# Patient Record
Sex: Male | Born: 1950 | Race: White | Hispanic: No | Marital: Single | Smoking: Former smoker
Health system: Southern US, Community
[De-identification: ages and names within clinical notes are randomized; demographics above are authoritative.]

## PROBLEM LIST (undated history)

## (undated) DIAGNOSIS — F419 Anxiety disorder, unspecified: Secondary | ICD-10-CM

## (undated) DIAGNOSIS — F32A Depression, unspecified: Secondary | ICD-10-CM

## (undated) DIAGNOSIS — E785 Hyperlipidemia, unspecified: Secondary | ICD-10-CM

## (undated) DIAGNOSIS — N400 Enlarged prostate without lower urinary tract symptoms: Secondary | ICD-10-CM

## (undated) DIAGNOSIS — M779 Enthesopathy, unspecified: Secondary | ICD-10-CM

## (undated) DIAGNOSIS — J45909 Unspecified asthma, uncomplicated: Secondary | ICD-10-CM

## (undated) DIAGNOSIS — C801 Malignant (primary) neoplasm, unspecified: Secondary | ICD-10-CM

## (undated) DIAGNOSIS — H9319 Tinnitus, unspecified ear: Secondary | ICD-10-CM

## (undated) DIAGNOSIS — G4700 Insomnia, unspecified: Secondary | ICD-10-CM

## (undated) DIAGNOSIS — F329 Major depressive disorder, single episode, unspecified: Secondary | ICD-10-CM

## (undated) DIAGNOSIS — I10 Essential (primary) hypertension: Secondary | ICD-10-CM

## (undated) HISTORY — DX: Benign prostatic hyperplasia without lower urinary tract symptoms: N40.0

## (undated) HISTORY — DX: Hyperlipidemia, unspecified: E78.5

## (undated) HISTORY — DX: Insomnia, unspecified: G47.00

---
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
## 1997-10-25 ENCOUNTER — Encounter: Admission: RE | Admit: 1997-10-25 | Discharge: 1997-10-25 | Payer: Self-pay | Admitting: Family Medicine

## 1997-11-26 ENCOUNTER — Encounter: Admission: RE | Admit: 1997-11-26 | Discharge: 1997-11-26 | Payer: Self-pay | Admitting: Family Medicine

## 1998-06-13 ENCOUNTER — Encounter: Admission: RE | Admit: 1998-06-13 | Discharge: 1998-06-13 | Payer: Self-pay | Admitting: Family Medicine

## 1998-07-19 ENCOUNTER — Encounter: Admission: RE | Admit: 1998-07-19 | Discharge: 1998-07-19 | Payer: Self-pay | Admitting: Family Medicine

## 1998-08-14 ENCOUNTER — Encounter: Admission: RE | Admit: 1998-08-14 | Discharge: 1998-08-14 | Payer: Self-pay | Admitting: Family Medicine

## 1998-08-28 ENCOUNTER — Encounter: Admission: RE | Admit: 1998-08-28 | Discharge: 1998-08-28 | Payer: Self-pay | Admitting: Family Medicine

## 1998-11-11 ENCOUNTER — Encounter: Admission: RE | Admit: 1998-11-11 | Discharge: 1998-11-11 | Payer: Self-pay | Admitting: Family Medicine

## 1998-12-10 ENCOUNTER — Encounter: Admission: RE | Admit: 1998-12-10 | Discharge: 1998-12-10 | Payer: Self-pay | Admitting: Sports Medicine

## 1998-12-27 ENCOUNTER — Encounter: Admission: RE | Admit: 1998-12-27 | Discharge: 1998-12-27 | Payer: Self-pay | Admitting: Family Medicine

## 1999-07-04 ENCOUNTER — Inpatient Hospital Stay (HOSPITAL_COMMUNITY): Admission: EM | Admit: 1999-07-04 | Discharge: 1999-07-04 | Payer: Self-pay | Admitting: Emergency Medicine

## 1999-07-04 ENCOUNTER — Encounter: Payer: Self-pay | Admitting: Emergency Medicine

## 1999-07-08 ENCOUNTER — Encounter: Admission: RE | Admit: 1999-07-08 | Discharge: 1999-07-08 | Payer: Self-pay | Admitting: Family Medicine

## 1999-07-09 ENCOUNTER — Ambulatory Visit (HOSPITAL_COMMUNITY): Admission: RE | Admit: 1999-07-09 | Discharge: 1999-07-09 | Payer: Self-pay | Admitting: Family Medicine

## 2006-08-13 ENCOUNTER — Ambulatory Visit: Payer: Self-pay | Admitting: Family Medicine

## 2006-09-02 ENCOUNTER — Ambulatory Visit: Payer: Self-pay | Admitting: Family Medicine

## 2007-01-03 ENCOUNTER — Ambulatory Visit: Payer: Self-pay | Admitting: Family Medicine

## 2007-04-15 ENCOUNTER — Ambulatory Visit: Payer: Self-pay | Admitting: Family Medicine

## 2008-03-02 ENCOUNTER — Ambulatory Visit: Payer: Self-pay | Admitting: Family Medicine

## 2010-10-17 NOTE — Discharge Summary (Signed)
[place]  Patient:    Nathan Hunt, Nathan Hunt                      MRN: [id number] Dictator:   Lyndee Leo. Janey Greaser, M.D.                           Discharge Summary  HISTORY OF PRESENT ILLNESS:  [name] is a 60 year old white male who was admitted on July 03, 1999, with a chief complaint of feeling his heart beating fast and then stopping and starting.  Patient was found to be in atrial fibrillation in the emergency room.  However, before any intervention was done,  patient converted back to normal sinus rhythm.  HOSPITAL COURSE:  Patient was admitted to the telemetry bed for monitoring. Patient was in normal sinus rhythm for the rest of hospital stay with no symptoms whatsoever, no chest pain, no dyspnea.  Patient had an echocardiogram done on the following morning which is still pending.  PERTINENT LABORATORY WORK:  CBC:  White blood cell count was 7, hemoglobin 15.7, hematocrit 43.7, platelets 221.  BMET was within normal limits.  He ruled out by cardiac enzymes.  Thyroid studies:  Free T4 was 1.27, TSH was 5.2.  He was negative on urine drug screen.  UA was negative.  DISCHARGE DIAGNOSIS:  New onset atrial fibrillation with reconversion to normal  sinus rhythm without any intervention.  PLAN:  Patient is to follow up with Dr. Lance Bosch from the [facility] who is his primary on February 6 at 2:50.  Patient will continue on his home medications.  FOLLOW-UP:  Patient will need an outpatient stress test as well as just monitoring for any return to atrial fibrillation.  DISCHARGE MEDICATIONS:  Home medicines: 1. Claritin 10 mg p.o. q.d. 2. Naprosyn 500 mg p.o. p.r.n. 3. Pepcid 10 mg p.o. b.i.d. 4. Zyrtec 10 mg p.o. q.d. DD:  07/05/99 TD:  07/05/99 Job: [id number]

## 2010-10-17 NOTE — H&P (Signed)
[place]  Patient:    Nathan Hunt                     MRN: [id number] Adm. Date:  [id number] Attending:  Doneta Public Dictator:   Heath Gold, M.D.                         History and Physical  DATE OF BIRTH:  May 30, 1951.  SERVICE:  Family Medicine.  CHIEF COMPLAINT:  Rapid heart rate.  HISTORY OF PRESENT ILLNESS:  This is a 60 year old white male who reports feeling his heart start beating fast and then stopping and starting; he noticed this first at 11:10 p.m.  This feeling in his chest was also associated with a pressure in his throat and some mild pressure in his chest but no chest pain and no numbness in his arms.  He reports that he has felt this one to two times before in his adult life but it has only lasted a few seconds.  He denies any recent illness.  No cardiac disease.  No thyroid disease.  He is a large coffee drinker.  PAST MEDICAL HISTORY:  Only significant for chronic low back pain that he has had for six months and occasional allergic rhinitis.  No hypertension.  No diabetes. No heart disease.  MEDICATIONS: 1. Naprosyn 500 mg p.o. q.d. p.r.n. 2. Pepcid 10 mg p.o. b.i.d. or t.i.d. 3. Digestive enzymes q.d.  ALLERGIES:  None.  SOCIAL HISTORY:  He has a 24-pack-year history of tobacco abuse but quit in 1992. An occasional social drinker, less than one beer per week.  No drug use.  No exercise x 6 months.  He does drink 20 to 30 ounces of coffee per day.  He is single, lives alone in [place], [place] and has no children.  FAMILY HISTORY:  Mom is alive at 26 with hypertension.  Dad is alive at 1 with an MI at 31 years old.  Maternal grandmother died of an MI at 40.  Maternal grandfather died at an unknown age of bone cancer.  He has two siblings that are healthy.  PAST SURGICAL HISTORY:  Tonsillectomy as a child.  REVIEW OF SYSTEMS:  He denies any recent fevers or chills.  No  weight loss.  No  chest pain.  No shortness of breath.  No cough.  No nausea or diarrhea.  No abdominal pain.  No change in bowel movements.  No bright red blood per rectum. No melena.  No skin rash.  No skin changes.  No change in appetite.  No vision changes.  No gait abnormalities.  No joint pain.  No dysuria.  No difficulty urinating.  PHYSICAL EXAMINATION:  VITAL SIGNS:  Temperature is 96.9, pulse 88 and regular, blood pressure 105/78,  respirations 20.  O2 99% on room air.  GENERAL:  He is very well-appearing, well-kept, alert and oriented x 4, in no distress.  HEENT:  [place]/AT.  PERRLA.  EOMI.  Nares patent.  OP without erythema or exudate.  Good dentition.  TMs clear.  NECK:  Supple.  No lymphadenopathy.  No JVD.  No thyromegaly.  CV:  RRR, without murmur, rub or gallop.  Normal PMI.  LUNGS:  CTAB.  ABDOMEN:  Soft, nontender, nondistended.  Positive BS.  No HSM.  SKIN:  Warm and dry.  No rash.  NEUROLOGIC:  Cranial nerves II-XII grossly intact.  Strength 5/5 throughout.  Sensation intact and equal throughout.  Rapid alternating movements intact bilaterally.  DTRs 1 to 2+ throughout.  Babinskis downgoing.  RECTAL:  Normal sphincter tone.  Prostate smooth.  No nodules.  No stool in vault. Heme-negative.  LABORATORY AND X-RAY FINDINGS:  Sodium 139, potassium 3.6, chloride 108, bicarb 26, BUN 29, creatinine 0.8, glucose 124, calcium 9.1.  White blood cell count 7.0, hemoglobin 15.7, hematocrit 43.7, platelets 221,000.  PT 12.8, INR 1.0, PTT 33,  troponin K 0.04, CK 100, MB 1.5 with an index of 1.5.  Chest x-ray shows no active disease.  EKG initially showed atrial fibrillation at 151 beats per minute.  Repeat EKG shows normal sinus rhythm at 91 beats per minute.  ASSESSMENT AND PLAN:  This is a 60 year old white male with new-onset atrial fibrillation and self-conversion without requiring any medication.  Unclear etiology of his atrial fibrillation.  He has no  obvious intrinsic cardiac disease, no history of thyroid disease and no recent viral illness.  We will admit to telemetry, rule him out for an myocardial infarction, check thyroid-stimulating  hormone and thyroid studies, obtain an 2-D echocardiogram in the a.m. to evaluate left ventricular function, left atrial size and valve and consider anticoagulation if he converts back into atrial fibrillation.  Will encourage decreased caffeine use. DD:  07/04/99 TD:  07/04/99 Job: [id number]

## 2010-11-25 ENCOUNTER — Encounter: Payer: Self-pay | Admitting: Family Medicine

## 2011-03-10 DIAGNOSIS — Z0289 Encounter for other administrative examinations: Secondary | ICD-10-CM

## 2011-03-23 ENCOUNTER — Telehealth: Payer: Self-pay | Admitting: Family Medicine

## 2011-03-23 NOTE — Telephone Encounter (Signed)
Nathan Hunt, what kind of elbow brace would you suggest this patient to buy?  CLS

## 2011-03-23 NOTE — Telephone Encounter (Signed)
HAVING ARM/ELBOW PROBLEM THAT HE HAS FROM TIME TO TIME. COMES FROM OVERUSE WHILE DOING SAWING MOTION. CROOK OF ARM HURT AND STIFF. BEEN DOING HOT BATHS AND USING IBUPROFEN LIKE NORMAL WHICH HAS HELPED. DOES NURSE THINK FOREARM BANDAGE WOULD HELP? THINK HE USED ONE BEFORE BUT WHAT KIND SHLD HE GET IF THEY THINK IT WILL HELP?

## 2011-03-23 NOTE — Telephone Encounter (Signed)
i recommend OV so I can eval.

## 2011-03-24 NOTE — Telephone Encounter (Signed)
Patient states that he is unable to afford an OV. He is trying to save his house. He said that he has an old sling and that he thinks that it will be all right. CLS

## 2012-09-01 ENCOUNTER — Encounter (HOSPITAL_COMMUNITY): Payer: Self-pay | Admitting: *Deleted

## 2012-09-01 ENCOUNTER — Emergency Department (HOSPITAL_COMMUNITY)
Admission: EM | Admit: 2012-09-01 | Discharge: 2012-09-02 | Disposition: A | Discharge status: Home / Self Care | Payer: BC Managed Care – PPO | Attending: Emergency Medicine | Admitting: Emergency Medicine

## 2012-09-01 ENCOUNTER — Emergency Department (HOSPITAL_COMMUNITY): Payer: BC Managed Care – PPO

## 2012-09-01 DIAGNOSIS — R443 Hallucinations, unspecified: Secondary | ICD-10-CM

## 2012-09-01 DIAGNOSIS — F3289 Other specified depressive episodes: Secondary | ICD-10-CM | POA: Insufficient documentation

## 2012-09-01 DIAGNOSIS — G4700 Insomnia, unspecified: Secondary | ICD-10-CM | POA: Insufficient documentation

## 2012-09-01 DIAGNOSIS — F29 Unspecified psychosis not due to a substance or known physiological condition: Secondary | ICD-10-CM

## 2012-09-01 DIAGNOSIS — F411 Generalized anxiety disorder: Secondary | ICD-10-CM | POA: Insufficient documentation

## 2012-09-01 DIAGNOSIS — Z8669 Personal history of other diseases of the nervous system and sense organs: Secondary | ICD-10-CM | POA: Insufficient documentation

## 2012-09-01 DIAGNOSIS — Z79899 Other long term (current) drug therapy: Secondary | ICD-10-CM | POA: Insufficient documentation

## 2012-09-01 DIAGNOSIS — F339 Major depressive disorder, recurrent, unspecified: Secondary | ICD-10-CM

## 2012-09-01 DIAGNOSIS — I10 Essential (primary) hypertension: Secondary | ICD-10-CM | POA: Insufficient documentation

## 2012-09-01 DIAGNOSIS — Z8739 Personal history of other diseases of the musculoskeletal system and connective tissue: Secondary | ICD-10-CM | POA: Insufficient documentation

## 2012-09-01 DIAGNOSIS — F329 Major depressive disorder, single episode, unspecified: Secondary | ICD-10-CM | POA: Insufficient documentation

## 2012-09-01 HISTORY — DX: Enthesopathy, unspecified: M77.9

## 2012-09-01 HISTORY — DX: Major depressive disorder, single episode, unspecified: F32.9

## 2012-09-01 HISTORY — DX: Essential (primary) hypertension: I10

## 2012-09-01 HISTORY — DX: Depression, unspecified: F32.A

## 2012-09-01 LAB — COMPREHENSIVE METABOLIC PANEL
AST: 30 U/L (ref 0–37)
Alkaline Phosphatase: 71 U/L (ref 39–117)
CO2: 24 mEq/L (ref 19–32)
Chloride: 102 mEq/L (ref 96–112)
Creatinine, Ser: 1.07 mg/dL (ref 0.50–1.35)
GFR calc non Af Amer: 72 mL/min — ABNORMAL LOW (ref >90)
Potassium: 3.9 mEq/L (ref 3.5–5.1)
Total Bilirubin: 0.4 mg/dL (ref 0.3–1.2)

## 2012-09-01 LAB — CBC WITH DIFFERENTIAL/PLATELET
Basophils Absolute: 0 10*3/uL (ref 0.0–0.1)
HCT: 39.4 % (ref 39.0–52.0)
Hemoglobin: 13.4 g/dL (ref 13.0–17.0)
Lymphocytes Relative: 22 % (ref 12–46)
Monocytes Absolute: 0.6 10*3/uL (ref 0.1–1.0)
Monocytes Relative: 10 % (ref 3–12)
Neutro Abs: 4.1 10*3/uL (ref 1.7–7.7)
Neutrophils Relative %: 67 % (ref 43–77)
RDW: 13.3 % (ref 11.5–15.5)
WBC: 6.1 10*3/uL (ref 4.0–10.5)

## 2012-09-01 LAB — SALICYLATE LEVEL: Salicylate Lvl: <2 mg/dL — ABNORMAL LOW (ref 2.8–20.0)

## 2012-09-01 LAB — ETHANOL: Alcohol, Ethyl (B): <11 mg/dL (ref 0–11)

## 2012-09-01 LAB — ACETAMINOPHEN LEVEL: Acetaminophen (Tylenol), Serum: <15 ug/mL (ref 10–30)

## 2012-09-01 IMAGING — CT CT HEAD W/O CM
2 series · 16 of 30 positions shown, 20 images · non-contrast
Comparison: None.

CLINICAL DATA: Bizarre behavior.

CT HEAD WITHOUT CONTRAST
TECHNIQUE: Contiguous axial images were obtained from the base of
the skull through the vertex without contrast.

[Series 2: head w/o · axial · non-contrast · 0.48mm/px · z∈[+987,+1117]mm · 13 of 32 slices shown, 17 images]
[im 3/32  brain]
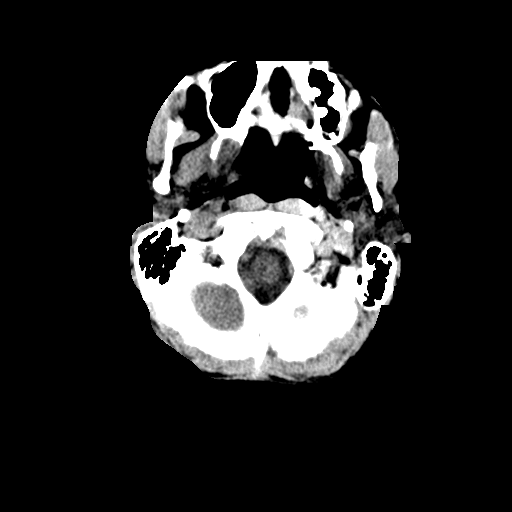
[im 3/32  bone]
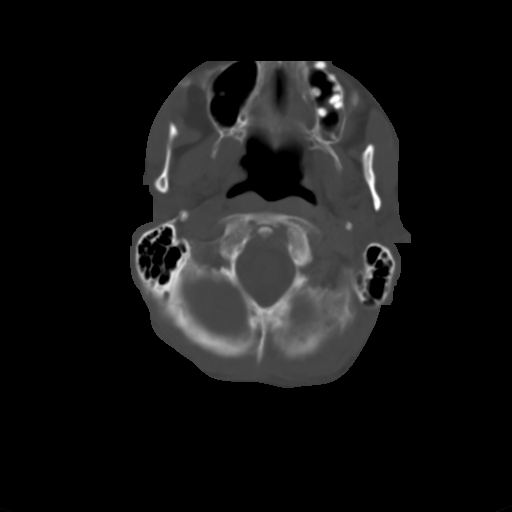
[im 5/32  brain]
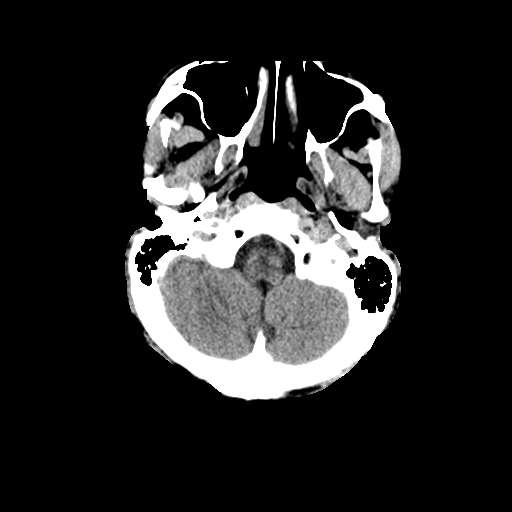
[im 7/32  brain]
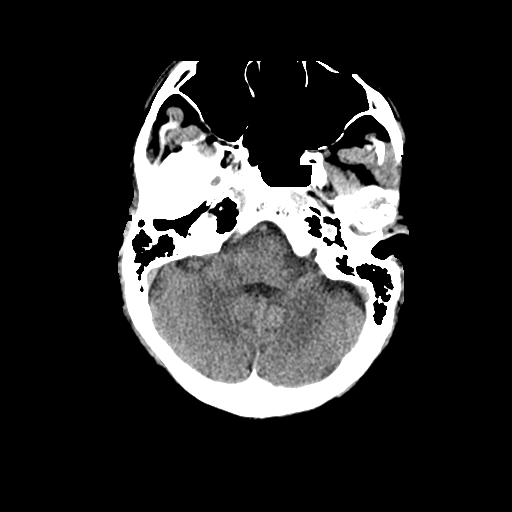
[im 9/32  brain]
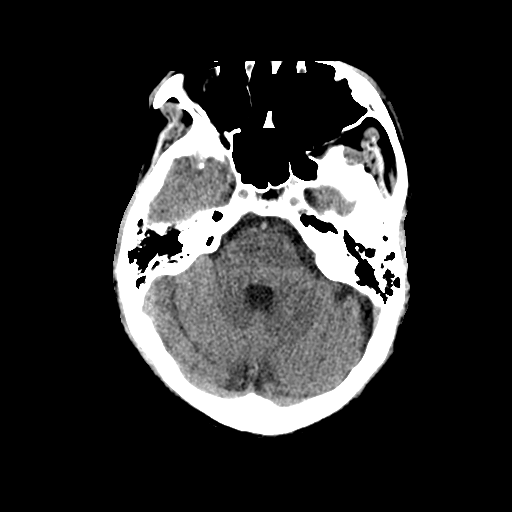
[im 12/32  brain]
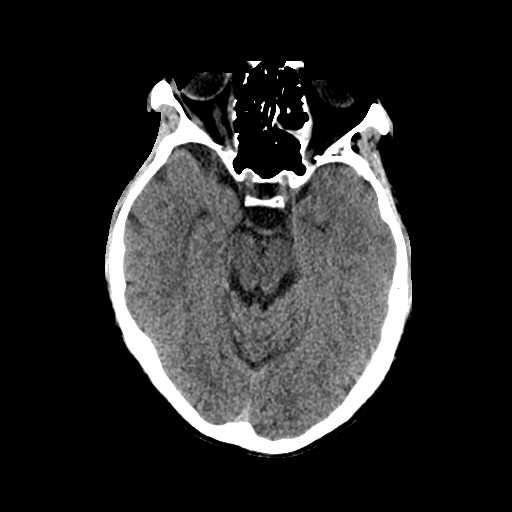
[im 12/32  bone]
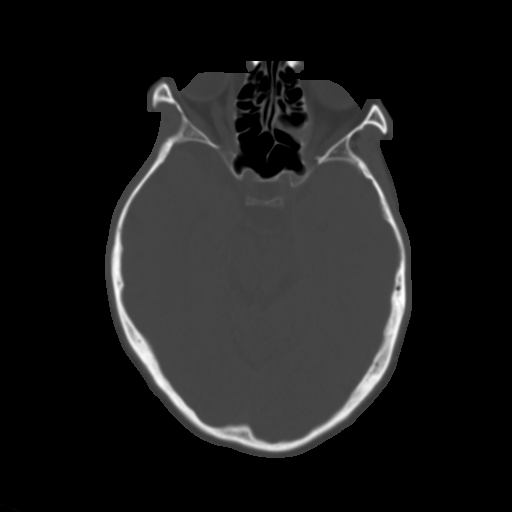
[im 14/32  brain]
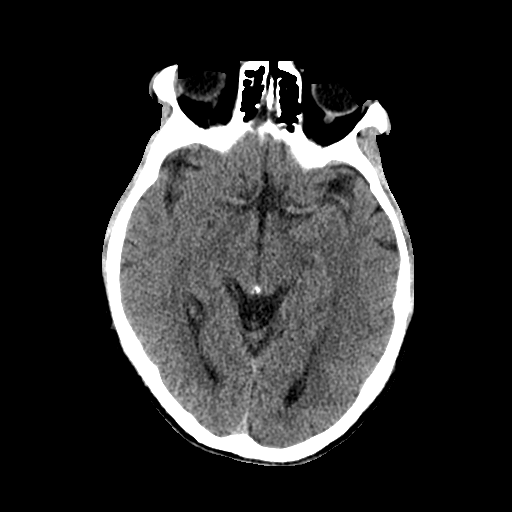
[im 16/32  brain]
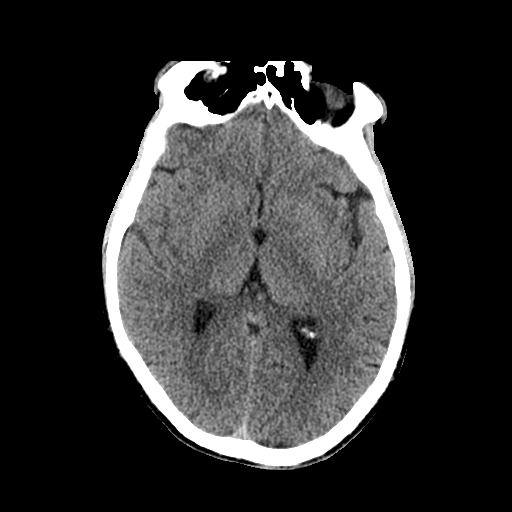
[im 18/32  brain]
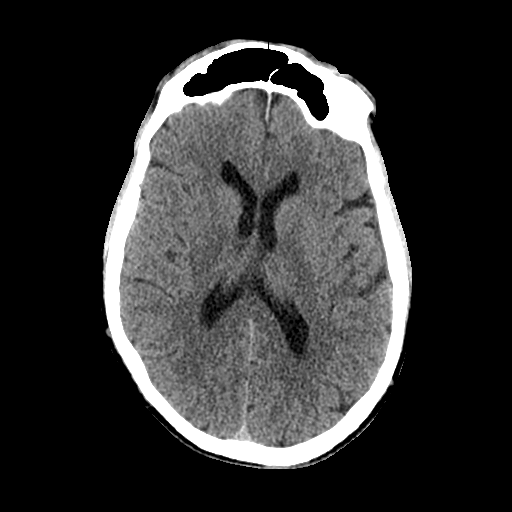
[im 20/32  brain]
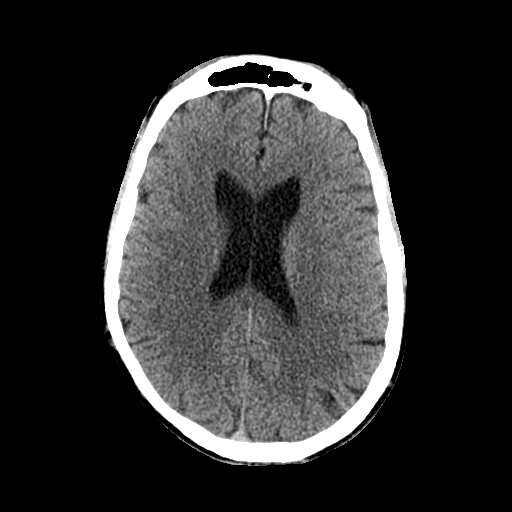
[im 20/32  bone]
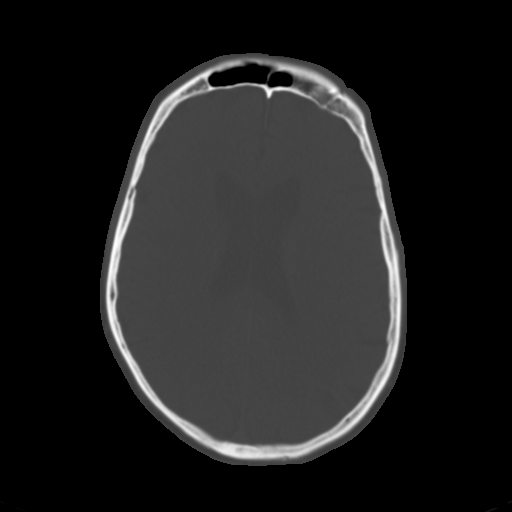
[im 23/32  brain]
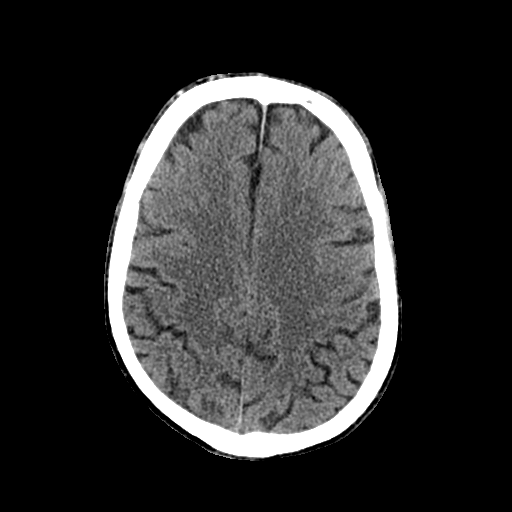
[im 25/32  brain]
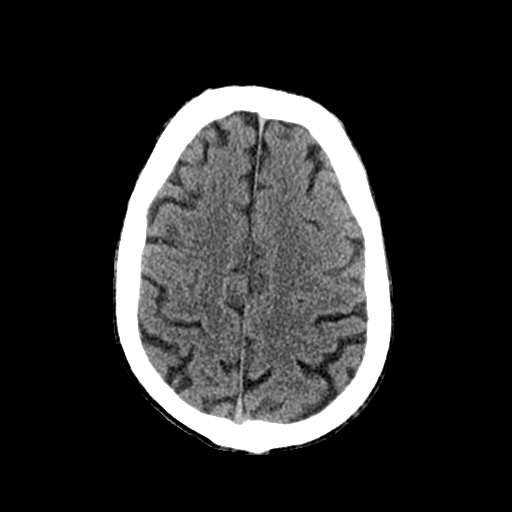
[im 27/32  brain]
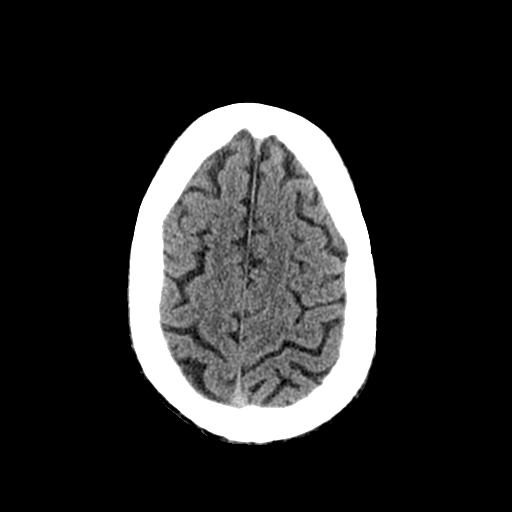
[im 29/32  brain]
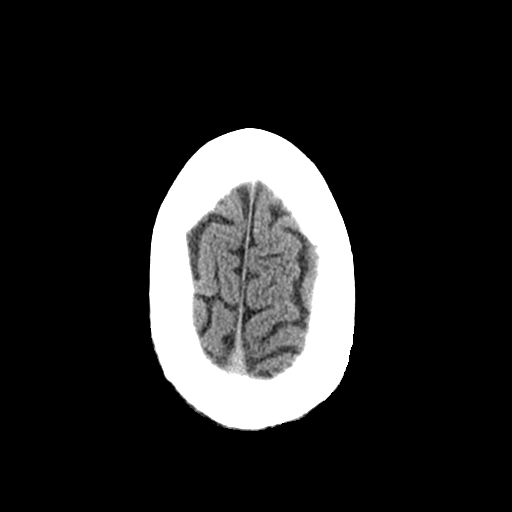
[im 29/32  bone]
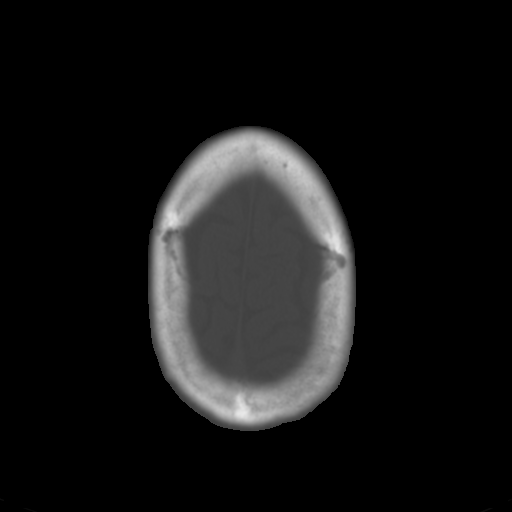

[Series 3: bone windows · axial · 0.48mm/px · z∈[+987,+1032]mm · 3 of 32 slices shown]
[im 3/32  bone]
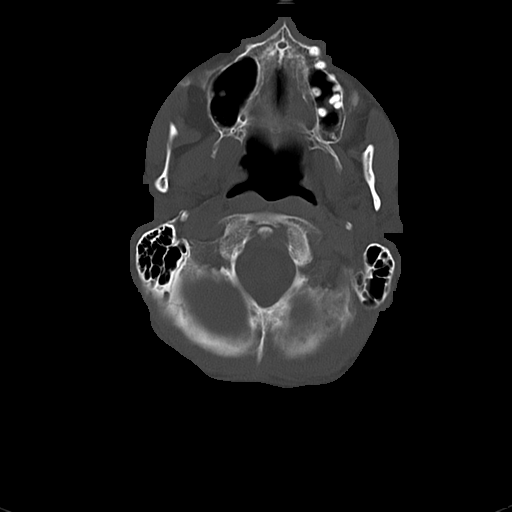
[im 7/32  bone]
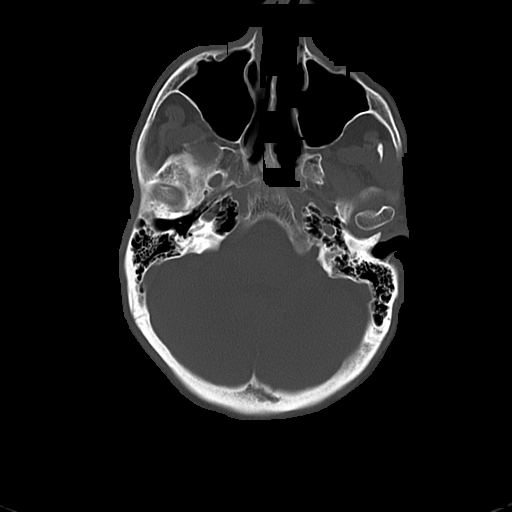
[im 12/32  bone]
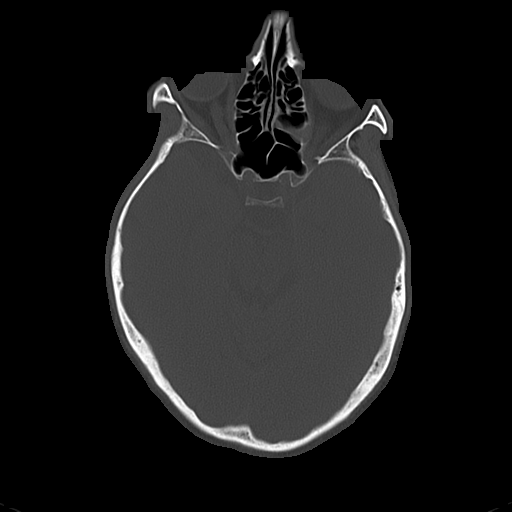

[16 of 30 positions shown; findings below may reference images not displayed]

FINDINGS: There is no evidence for acute infarction, intracranial
hemorrhage, mass lesion, hydrocephalus, or extra-axial fluid.
There is no atrophy or white matter disease.  The calvarium is
intact. No acute sinus or mastoid fluid.
IMPRESSION: No acute findings.  Negative exam.

## 2012-09-01 MED ORDER — IBUPROFEN 600 MG PO TABS: 600.0000 mg | ORAL_TABLET | Freq: Three times a day (TID) | ORAL | Status: DC | PRN

## 2012-09-01 MED ORDER — ACETAMINOPHEN 325 MG PO TABS: 650.0000 mg | ORAL_TABLET | ORAL | Status: DC | PRN

## 2012-09-01 MED ORDER — ZIPRASIDONE MESYLATE 20 MG IM SOLR
10.0000 mg | Freq: Once | INTRAMUSCULAR | Status: AC
  Administered 2012-09-01: 10 mg via INTRAMUSCULAR
  Filled 2012-09-01: qty 20

## 2012-09-01 MED ORDER — LORAZEPAM 1 MG PO TABS: 1.0000 mg | ORAL_TABLET | Freq: Three times a day (TID) | ORAL | Status: DC | PRN

## 2012-09-01 MED ORDER — LORAZEPAM 2 MG/ML IJ SOLN
1.0000 mg | Freq: Once | INTRAMUSCULAR | Status: AC
  Administered 2012-09-01: 1 mg via INTRAMUSCULAR
  Filled 2012-09-01: qty 1

## 2012-09-01 MED ORDER — TRAZODONE HCL 50 MG PO TABS: 150.0000 mg | ORAL_TABLET | Freq: Every day | ORAL | Status: DC

## 2012-09-01 NOTE — ED Notes (Signed)
[id number]<YN> Expected date:<BR> Expected time:<BR> Means of arrival:<BR> Comments:<BR> EMS/62 yo male found in car by [employer]-mental health issues

## 2012-09-01 NOTE — ED Notes (Signed)
Spoke to [employer], Charity fundraiser; she Physiological scientist Terri states ok for patient to come back in wheelchair.

## 2012-09-01 NOTE — ED Provider Notes (Signed)
History     CSN: [id number]  Arrival date & time 09/01/12  1959   First MD Initiated Contact with Patient 09/01/12 2021      Chief Complaint  Patient presents with  . Medical Clearance    (Consider location/radiation/quality/duration/timing/severity/associated sxs/prior treatment) The history is provided by the patient.  Nathan Hunt is a 62 y.o. male history depression, insomnia, tinnitus, here presenting with hallucinations. He said that he started having hallucinations about 3 weeks ago after seeing his therapist. He stated that he's been hearing some voices the voices have not told him to do anything. Denies any visual hallucinations. Denies any drug use. He also mentioned that he cannot sleep very well because if he sleeps 500,000 people will die. Denies any suicidal ideation or homicidal ideations. Denies any psych admissions in the past. He was found by the police in his car crying and was brought in for evaluation.  Level V caveat- AMS    Past Medical History  Diagnosis Date  . Tinnitus   . Insomnia   . Depression   . Tendonitis   . Hypertension     History reviewed. No pertinent past surgical history.  Family History  Problem Relation Age of Onset  . Heart disease Mother   . Heart disease Father   . Heart disease Sister     History  Substance Use Topics  . Smoking status: Never Smoker   . Smokeless tobacco: Not on file  . Alcohol Use: 0.0 oz/week    1-2 Cans of beer per week     Comment: PER WEEK      Review of Systems  Psychiatric/Behavioral: Positive for hallucinations and sleep disturbance.  All other systems reviewed and are negative.    Allergies  Review of patient's allergies indicates no known allergies.  Home Medications   Current Outpatient Rx  Name  Route  Sig  Dispense  Refill  . cetirizine (ZYRTEC) 10 MG tablet   Oral   Take 10 mg by mouth daily.           . diclofenac (VOLTAREN) 50 MG EC tablet   Oral   Take 50 mg by  mouth 2 (two) times daily.           . promethazine-codeine (PHENERGAN WITH CODEINE) 6.25-10 MG/5ML syrup   Oral   Take 5 mLs by mouth every 4 (four) hours as needed.           . traZODone (DESYREL) 150 MG tablet   Oral   Take 150 mg by mouth at bedtime.             BP 167/92  Pulse 100  Temp(Src) 98.3 F (36.8 C) (Oral)  Resp 22  SpO2 100%  Physical Exam  Nursing note and vitals reviewed. Constitutional: He is oriented to person, place, and time.  Agitated, anxious.   HENT:  Head: Normocephalic.  Mouth/Throat: Oropharynx is clear and moist.  Eyes: Conjunctivae are normal. Pupils are equal, round, and reactive to light.  Neck: Normal range of motion. Neck supple.  Cardiovascular: Normal rate, regular rhythm and normal heart sounds.   Pulmonary/Chest: Effort normal and breath sounds normal. No respiratory distress. He has no wheezes. He has no rales.  Abdominal: Soft. Bowel sounds are normal. He exhibits no distension. There is no tenderness. There is no rebound and no guarding.  Musculoskeletal: Normal range of motion.  Neurological: He is alert and oriented to person, place, and time. No cranial nerve deficit. Coordination  normal.  Skin: Skin is warm and dry.  Psychiatric:  Depressed, anxious. Perseverating on saving people.     ED Course  Procedures (including critical care time)  Labs Reviewed  COMPREHENSIVE METABOLIC PANEL - Abnormal; Notable for the following:    Glucose, Bld 121 (*)    BUN 24 (*)    GFR calc non Af Amer 72 (*)    GFR calc Af Amer 84 (*)    All other components within normal limits  SALICYLATE LEVEL - Abnormal; Notable for the following:    Salicylate Lvl <2.0 (*)    All other components within normal limits  CBC WITH DIFFERENTIAL  ETHANOL  ACETAMINOPHEN LEVEL  URINALYSIS, ROUTINE W REFLEX MICROSCOPIC  URINE RAPID DRUG SCREEN (HOSP PERFORMED)   Ct Head Wo Contrast  09/01/2012  *RADIOLOGY REPORT*  Clinical Data: Bizarre behavior.  CT  HEAD WITHOUT CONTRAST  Technique:  Contiguous axial images were obtained from the base of the skull through the vertex without contrast.  Comparison: None.  Findings: There is no evidence for acute infarction, intracranial hemorrhage, mass lesion, hydrocephalus, or extra-axial fluid. There is no atrophy or white matter disease.  The calvarium is intact. No acute sinus or mastoid fluid.  IMPRESSION: No acute findings.  Negative exam.   Original Report Authenticated By: Davonna Belling, M.D.      No diagnosis found.    MDM  Nathan Hunt is a 62 y.o. male here with hallucinations. Review of chart showed no previous psych visits. Will do CT head, psych clearance labs. Will need telepsych and ACT eval.   9:46 PM CT head unremarkable. Labs nl. Medically cleared. Pending ACT and telepsych eval. Much calmer now after geodon and ativan.        Richardean Canal, MD 09/01/12 2147

## 2012-09-01 NOTE — ED Notes (Signed)
Patient arrived to unit in wheelchair slumped over.

## 2012-09-01 NOTE — ED Notes (Signed)
Brother, Randal Goens here to see patient.  Guidelines explained regarding psych admission.  Cell Number [phone].  Pt to return in morning.

## 2012-09-01 NOTE — ED Notes (Signed)
Pt to ER via [facility]; pt was found in his car by [employer]; crying and upset staying that 1000 people are going to die if he falls asleep; pt also keeps repeating names and states that he hasn't seen the faces of his cousins in a year; pt denies being suicidal but says he is homicidial; pt states that he is going to be responsible for numerous deaths but will not tell RN how; pt grabbing at staff and stating they can't leave or let him fall asleep.

## 2012-09-01 NOTE — ED Notes (Addendum)
Pt changed into blue scrubs.  Pt wanded by security.  Pt belongings as follows: pair of brown boots with red laces, pair of blue jeans, pair of green underwear, one blue button up shirt, one black belt, one grey hat.  All belongings placed behind nurses station in front of resus rooms.  Update:  Received lg flip cell phone, visa debit card, five dollar bill, and [place] drivers license from [employer] pd.  All belongings placed into pt's belongings bag.

## 2012-09-01 NOTE — ED Notes (Signed)
Patient has one bag in locker 37.

## 2012-09-01 NOTE — ED Notes (Signed)
Patient lethargic at this time due to medication. Patient placed close to cal bell. Will continue to monitor patient closely.

## 2012-09-02 DIAGNOSIS — F329 Major depressive disorder, single episode, unspecified: Secondary | ICD-10-CM

## 2012-09-02 LAB — RAPID URINE DRUG SCREEN, HOSP PERFORMED
Amphetamines: NOT DETECTED
Barbiturates: NOT DETECTED
Opiates: NOT DETECTED
Tetrahydrocannabinol: NOT DETECTED

## 2012-09-02 LAB — URINALYSIS, ROUTINE W REFLEX MICROSCOPIC
Bilirubin Urine: NEGATIVE
Hgb urine dipstick: NEGATIVE
Ketones, ur: NEGATIVE mg/dL
Protein, ur: NEGATIVE mg/dL
Urobilinogen, UA: 1 mg/dL (ref 0.0–1.0)

## 2012-09-02 NOTE — [facility] Suicide Risk Assessment (Signed)
Suicide Risk Assessment  Discharge Assessment     Demographic Factors:  Male, Adolescent or young adult, Caucasian, Low socioeconomic status and Living alone  Mental Status Per Nursing Assessment::   On Admission:     Current Mental Status by Physician: NA  Loss Factors: Decrease in vocational status, Decline in physical health and Financial problems/change in socioeconomic status  Historical Factors: NA  Risk Reduction Factors:   Sense of responsibility to family, Religious beliefs about death, Positive social support and Positive therapeutic relationship  Continued Clinical Symptoms:  Depression:   Insomnia Recent sense of peace/wellbeing  Cognitive Features That Contribute To Risk:  Closed-mindedness Polarized thinking    Suicide Risk:  Minimal: No identifiable suicidal ideation.  Patients presenting with no risk factors but with morbid ruminations; may be classified as minimal risk based on the severity of the depressive symptoms  Discharge Diagnoses:   AXIS I:  Major Depression, Recurrent severe AXIS II:  Deferred AXIS III:   Past Medical History  Diagnosis Date  . Tinnitus   . Insomnia   . Depression   . Tendonitis   . Hypertension    AXIS IV:  occupational problems, other psychosocial or environmental problems and problems related to social environment AXIS V:  41-50 serious symptoms  Plan Of Care/Follow-up recommendations:  Activity:  as tolerated Diet:  regular  Is patient on multiple antipsychotic therapies at discharge:  No   Has Patient had three or more failed trials of antipsychotic monotherapy by history:  No  Recommended Plan for Multiple Antipsychotic Therapies: Not applicable   Yovana Scogin,JANARDHAHA R. 09/02/2012, 10:58 AM

## 2012-09-02 NOTE — [facility] Counselor (Signed)
Patient evaluated by Dr. Ozella Rocks and discharge home was recommended. Writer met with patient to determine his follow up needs. Patient sts that he is currently a client of [facility], however; transitioning to [facility]. Writer offered to assist patient with this transition by scheduling a outpatient appointment for him at [facility]. Patient declined stating that he will make his own appointment if he is given the number and address to the facility. Writer provide patient with what he requested. He was discharged home with his brother whom agreed to check on patient over the next several days.
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
## 2012-09-02 NOTE — BH Assessment (Signed)
Assessment Note   Nathan Hunt is an 62 y.o. male. Patient states that everything started 1 month ago. He stated that he goes to [facility] and they started tapering him of his Klonopin that he has been on for the past 2 years. He is currently at the point where he is taking 1/2 dose every 3 days. He complains of problems with sleep --> to increased fatigue, odd bad dreams and the return of very vivid dreams. Patient would not elaborate past the fact that they were vivid enough that he felt like he could touch them.  He went into great detail about his tinnitus that developed at 0730 on the last Saturday in February of 2006. He hears a continuous high pitched squealing in his ears that gets worse when it is cold outside. When asked why he thought several people would die if he fell asleep . Patient called out 2 names and stated that they were his neighbors and that they lived alone.  Attempted to call patient's brother at [phone] to obtain collateral information but there was no answer and the voice mail was full. Patient denies any suicidality, homicidality, or AVH. Patient is very circumstantial and the story is very fragmented. It is difficult for writer to ascertain patient's baseline without collateral information. Patient will need to be seen by Psychiatrist this morning.  Axis I: Major Depressive Disorder  Axis II: Deferred Axis III:  Past Medical History  Diagnosis Date  . Tinnitus   . Insomnia   . Depression   . Tendonitis   . Hypertension    Axis IV: recent medication taper Axis V: 45  Past Medical History:  Past Medical History  Diagnosis Date  . Tinnitus   . Insomnia   . Depression   . Tendonitis   . Hypertension     History reviewed. No pertinent past surgical history.  Family History:  Family History  Problem Relation Age of Onset  . Heart disease Mother   . Heart disease Father   . Heart disease Sister     Social History:  reports that he has never smoked. He  does not have any smokeless tobacco history on file. He reports that  drinks alcohol. His drug history is not on file.  Additional Social History:  Alcohol / Drug Use History of alcohol / drug use?: No history of alcohol / drug abuse  CIWA: CIWA-Ar BP: 103/66 mmHg Pulse Rate: 94 COWS:    Allergies: No Known Allergies  Home Medications:  (Not in a hospital admission)  OB/GYN Status:  No LMP for male patient.  General Assessment Data Location of Assessment: [facility] Living Arrangements: Alone Can pt return to current living arrangement?: Yes Admission Status: Voluntary Is patient capable of signing voluntary admission?: Yes Transfer from: Home Referral Source: MD  Education Status Is patient currently in school?: No Contact person:  Judie Grieve Grawe/ [phone])  Risk to self Suicidal Ideation: No Suicidal Intent: No Is patient at risk for suicide?: No Suicidal Plan?: No Access to Means: No What has been your use of drugs/alcohol within the last 12 months?:  (Denies) Previous Attempts/Gestures: No How many times?:  (None ) Other Self Harm Risks:  (None) Triggers for Past Attempts: None known Intentional Self Injurious Behavior: None Family Suicide History: No (1st cousin insttutionalized) Recent stressful life event(s): Other (Comment) (medication changes) Persecutory voices/beliefs?: No Depression: No Depression Symptoms: Insomnia Substance abuse history and/or treatment for substance abuse?: No Suicide prevention information given to non-admitted patients:  Not applicable  Risk to Others Homicidal Ideation: No Thoughts of Harm to Others: No Current Homicidal Intent: No Current Homicidal Plan: No Access to Homicidal Means: No Identified Victim:  (None) History of harm to others?: No Assessment of Violence: None Noted Violent Behavior Description:  (Na) Does patient have access to weapons?: No Criminal Charges Pending?: No Does patient have a court  date: No  Psychosis Hallucinations: None noted Delusions: None noted  Mental Status Report Appear/Hygiene: Body odor;Disheveled Eye Contact: Fair Motor Activity: Freedom of movement;Unremarkable Speech: Logical/coherent (Fragmented) Level of Consciousness: Alert Mood: Depressed Affect: Appropriate to circumstance Anxiety Level: Minimal Thought Processes: Circumstantial Judgement: Impaired Orientation: Person;Place;Time;Situation Obsessive Compulsive Thoughts/Behaviors: Moderate  Cognitive Functioning Concentration: Decreased Memory: Recent Intact;Remote Intact IQ: Average Insight: Fair Impulse Control: Fair Appetite: Fair Weight Loss:  (None noted) Weight Gain:  (None noted) Sleep: Decreased Total Hours of Sleep:  (Patient states he sleeps all night but has vivid dreams) Vegetative Symptoms: None  ADLScreening [facility] Assessment Services) Patient's cognitive ability adequate to safely complete daily activities?: Yes Patient able to express need for assistance with ADLs?: Yes Independently performs ADLs?: Yes (appropriate for developmental age)  Abuse/Neglect [facility]) Physical Abuse: Denies Verbal Abuse: Denies Sexual Abuse: Denies  Prior Inpatient Therapy Prior Inpatient Therapy: No  Prior Outpatient Therapy Prior Outpatient Therapy: Yes Prior Therapy Dates:  (Current) Prior Therapy Facilty/Provider(s):  [facility] cur[facility]) Reason for Treatment:  (MDD med management)  ADL Screening (condition at time of admission) Patient's cognitive ability adequate to safely complete daily activities?: Yes Patient able to express need for assistance with ADLs?: Yes Independently performs ADLs?: Yes (appropriate for developmental age) Weakness of Legs: None Weakness of Arms/Hands: None       Abuse/Neglect Assessment (Assessment to be complete while patient is alone) Physical Abuse: Denies Verbal Abuse: Denies Sexual Abuse: Denies Exploitation of patient/patient's resources:  Denies Self-Neglect: Denies Values / Beliefs Cultural Requests During Hospitalization: None Spiritual Requests During Hospitalization: None        Additional Information 1:1 In Past 12 Months?: No CIRT Risk: No Elopement Risk: No Does patient have medical clearance?: Yes     Disposition:  Disposition Initial Assessment Completed for this Encounter: Yes Disposition of Patient: Other dispositions Other disposition(s): Other (Comment) (Pateint to be seen by Dr. Elsie Saas )  On Site Evaluation by:   Reviewed with Physician:     Rudi Coco 09/02/2012 6:40 AM

## 2012-09-02 NOTE — ED Notes (Addendum)
Belongings returned to patient, meds, d/c instructions and follow up reviewed with pt, pt signed self out.

## 2012-09-02 NOTE — ED Provider Notes (Signed)
Sleeping comfortably this morning. Will be seen by psych Dr.  Juliet Rude. Rubin Payor, MD 09/02/12 [phone]

## 2012-09-10 ENCOUNTER — Emergency Department (HOSPITAL_COMMUNITY)
Admission: EM | Admit: 2012-09-10 | Discharge: 2012-09-11 | Disposition: A | Discharge status: Home / Self Care | Payer: BC Managed Care – PPO | Attending: Emergency Medicine | Admitting: Emergency Medicine

## 2012-09-10 ENCOUNTER — Encounter (HOSPITAL_COMMUNITY): Payer: Self-pay | Admitting: *Deleted

## 2012-09-10 DIAGNOSIS — F411 Generalized anxiety disorder: Secondary | ICD-10-CM | POA: Insufficient documentation

## 2012-09-10 DIAGNOSIS — Z79899 Other long term (current) drug therapy: Secondary | ICD-10-CM | POA: Insufficient documentation

## 2012-09-10 DIAGNOSIS — Z8669 Personal history of other diseases of the nervous system and sense organs: Secondary | ICD-10-CM | POA: Insufficient documentation

## 2012-09-10 DIAGNOSIS — F329 Major depressive disorder, single episode, unspecified: Secondary | ICD-10-CM | POA: Insufficient documentation

## 2012-09-10 DIAGNOSIS — Z8739 Personal history of other diseases of the musculoskeletal system and connective tissue: Secondary | ICD-10-CM | POA: Insufficient documentation

## 2012-09-10 DIAGNOSIS — F3289 Other specified depressive episodes: Secondary | ICD-10-CM | POA: Insufficient documentation

## 2012-09-10 DIAGNOSIS — I10 Essential (primary) hypertension: Secondary | ICD-10-CM | POA: Insufficient documentation

## 2012-09-10 DIAGNOSIS — F419 Anxiety disorder, unspecified: Secondary | ICD-10-CM

## 2012-09-10 LAB — RAPID URINE DRUG SCREEN, HOSP PERFORMED: Barbiturates: NOT DETECTED

## 2012-09-10 LAB — CBC WITH DIFFERENTIAL/PLATELET
Basophils Absolute: 0.1 10*3/uL (ref 0.0–0.1)
Basophils Relative: 1 % (ref 0–1)
Eosinophils Absolute: 0.1 10*3/uL (ref 0.0–0.7)
MCH: 30.4 pg (ref 26.0–34.0)
MCHC: 34.4 g/dL (ref 30.0–36.0)
Neutro Abs: 4.1 10*3/uL (ref 1.7–7.7)
Neutrophils Relative %: 65 % (ref 43–77)
RDW: 13.6 % (ref 11.5–15.5)

## 2012-09-10 LAB — COMPREHENSIVE METABOLIC PANEL
AST: 29 U/L (ref 0–37)
Albumin: 4.4 g/dL (ref 3.5–5.2)
Alkaline Phosphatase: 61 U/L (ref 39–117)
BUN: 25 mg/dL — ABNORMAL HIGH (ref 6–23)
Chloride: 101 mEq/L (ref 96–112)
Creatinine, Ser: 1 mg/dL (ref 0.50–1.35)
Potassium: 4.1 mEq/L (ref 3.5–5.1)
Total Bilirubin: 0.2 mg/dL — ABNORMAL LOW (ref 0.3–1.2)
Total Protein: 7.2 g/dL (ref 6.0–8.3)

## 2012-09-10 MED ORDER — ALUM & MAG HYDROXIDE-SIMETH 200-200-20 MG/5ML PO SUSP: 30.0000 mL | ORAL | Status: DC | PRN

## 2012-09-10 MED ORDER — IBUPROFEN 600 MG PO TABS: 600.0000 mg | ORAL_TABLET | Freq: Three times a day (TID) | ORAL | Status: DC | PRN

## 2012-09-10 MED ORDER — ZOLPIDEM TARTRATE 5 MG PO TABS
5.0000 mg | ORAL_TABLET | Freq: Every evening | ORAL | Status: DC | PRN
  Administered 2012-09-10: 5 mg via ORAL
  Filled 2012-09-10: qty 1

## 2012-09-10 MED ORDER — LORAZEPAM 1 MG PO TABS
1.0000 mg | ORAL_TABLET | Freq: Three times a day (TID) | ORAL | Status: DC | PRN
  Administered 2012-09-10 – 2012-09-11 (×2): 1 mg via ORAL
  Filled 2012-09-10 (×2): qty 1

## 2012-09-10 MED ORDER — ACETAMINOPHEN 325 MG PO TABS: 650.0000 mg | ORAL_TABLET | ORAL | Status: DC | PRN

## 2012-09-10 MED ORDER — ONDANSETRON HCL 4 MG PO TABS: 4.0000 mg | ORAL_TABLET | Freq: Three times a day (TID) | ORAL | Status: DC | PRN

## 2012-09-10 MED ORDER — NICOTINE 21 MG/24HR TD PT24: 21.0000 mg | MEDICATED_PATCH | Freq: Every day | TRANSDERMAL | Status: DC
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
## 2012-09-10 NOTE — ED Notes (Signed)
Nathan Hunt, pts  Blood relative "1st cousin." Nathan Hunt pts blood relative "1st cousin."  Nathan Hunt pts other relative.

## 2012-09-10 NOTE — ED Notes (Addendum)
Pt presents with [employer] stating he feels like he did last Thursday, found in the [employer] store, c/o pain in rt elbow, and unsteady on feet. Ambulated to room without difficulty. States he feels like he can not drive safely. Pt voices plan not to drive until Jan 7th next year and wants to speak with social worker now. Demanding coffee.
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
## 2012-09-10 NOTE — ED Notes (Addendum)
Up in room, talkative, nad, walking around in the room, declined medication to help him relax.

## 2012-09-10 NOTE — ED Notes (Signed)
Lunch tray ordered. Sandwich given until meal arrives.
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
## 2012-09-10 NOTE — ED Notes (Signed)
ACT Team, Paige at bedside.  Pt deneis SI/HI/AH.  Pt aaox3

## 2012-09-10 NOTE — ED Notes (Signed)
Up on the phone talking w/ Minerva Ends [phone]

## 2012-09-10 NOTE — ED Notes (Signed)
Up to the desk on the phone 

## 2012-09-10 NOTE — ED Notes (Signed)
Pt agreed to take something for his anxiety

## 2012-09-10 NOTE — ED Notes (Signed)
Two belonging bags placed in locker number 42. Consisted of pair of boots with red laces, socks, button up shirt, on pair of jeans, two sets of keys, ink pens in shirt pocket, underwear, and hat.

## 2012-09-10 NOTE — ED Notes (Addendum)
eatting supper, brother into see

## 2012-09-10 NOTE — ED Notes (Signed)
Pharm tech in w/ pt

## 2012-09-10 NOTE — ED Notes (Signed)
Pt. and belongings wanded by security 
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
## 2012-09-11 NOTE — ED Notes (Signed)
Written dc instructions reviewed w/ pt, pt verbalized understanding.  And reports that he has an appt w/ the [facility] scheduled this week

## 2012-09-11 NOTE — ED Notes (Signed)
Pt belongings returned after leaving the unit.  Pt ambulatory to dc window w/ difficulty. Brother w/ pt.

## 2012-09-11 NOTE — ED Notes (Addendum)
Pt up in room angry about being in a psych area and is wanting to leave to go to church at 1100. Reports he is never coming back here. Pt demanding to be moved to the regular ED "by noon." Pt reminded he does not have a ride and pt reported he would walk. Pt also reports that he going to move to [place] "in the next 24 hours."  Pt demanding to see  Various and numerous  dr's "by noon".  Dr Freida Busman aware--pt must stay for telepsych eval before being dc'd.

## 2012-09-11 NOTE — ED Notes (Signed)
Much calmer, sitting quietly, is aware that brother is on the way,  Eating sandwich.  Pt encouraged to follow up this week with the [facility].

## 2012-09-11 NOTE — ED Notes (Signed)
Pt's brother (brian) called and reports that he and his sister are concerned that the pt is being dc'd before he talks w/ a psychatrist and are requesting he be evaluated by psych prior to dc.  Will relay concerns to EDP

## 2012-09-11 NOTE — BH Assessment (Signed)
Assessment Note   Nathan Hunt is an 62 y.o. male. Pt presents voluntarily to [facility] after becoming confused while driving and asking for help at [employer]. Per pt, store staff called [employer] who transported pt to [facility]. Pt denies SI and HI. He denies [facility] and no delusions noted. Pt is polite and pleasant. He is hyperverbal with circumstantial speech. Pt reports "I thought I wasn't able to drive so I came to a halt". "I don't want to hurt anyone so I stopped driving. I wasn't sure where I was". He reports he thinks his confusion while driving stems from his change in dose of Klonopin he takes. Pt says he went to [facility] for med management and they began a 30-day schedule for tapering him off Klonopin. Pt says that he "couldn't keep up with the schedule" so he began increasing his dose on his own. When asked how much Klonopin he was taking at this time, pt says "I don't know". Pt insists his "problems" stem from his tinnitus which developed in 2006. He reports he became depressed and anxious at that time. Pt currently denies depressive and anxious symptoms. He tells Clinical research associate he likes being in the hospital b/c of the room service. He currently goes to [facility] for med management. Pt was in [facility] 09/02/12 with same chief complaint of "spacing out" while driving.  Collateral info provided by pt's brother Azari Hasler [phone]. Arlys John unable to provide additional info as he doesn't speak with brother often. Brother expresses concern re: pt's driving issues and indicates he will contact [facility] to discuss medication recommendations w/ pt's psychiatrist.  Brother will drive from [facility] and pick pt up in am upon d/c. Brother will provide transportation for pt upon d/c as brother agrees with Clinical research associate that pt should avoid driving until his meds are stabilized.   Axis I: Major Depressive Disorder, Recurrent, Moderate Axis II: Deferred Axis III:  Past Medical History  Diagnosis Date  . Tinnitus   .  Insomnia   . Depression   . Tendonitis   . Hypertension    Axis IV: other psychosocial or environmental problems and problems related to social environment Axis V: 51-60 moderate symptoms  Past Medical History:  Past Medical History  Diagnosis Date  . Tinnitus   . Insomnia   . Depression   . Tendonitis   . Hypertension     History reviewed. No pertinent past surgical history.  Family History:  Family History  Problem Relation Age of Onset  . Heart disease Mother   . Heart disease Father   . Heart disease Sister     Social History:  reports that he has never smoked. He does not have any smokeless tobacco history on file. He reports that  drinks alcohol. His drug history is not on file.  Additional Social History:  Alcohol / Drug Use Pain Medications: see PTA meds Prescriptions: see PTA meds - pt not taking Klonopin as directed Over the Counter: see PTA meds History of alcohol / drug use?: No history of alcohol / drug abuse  CIWA: CIWA-Ar BP: 122/71 mmHg Pulse Rate: 75 COWS:    Allergies: No Known Allergies  Home Medications:  (Not in a hospital admission)  OB/GYN Status:  No LMP for male patient.  General Assessment Data Location of Assessment: [facility] Living Arrangements: Alone Can pt return to current living arrangement?: Yes Admission Status: Voluntary Is patient capable of signing voluntary admission?: Yes Transfer from: Home Referral Source:  ([employer])  Education Status Is patient currently in school?: No Current Grade: na Highest grade of school patient has completed: 40 Name of school: [employer]	[place] person: na  Risk to self Suicidal Ideation: No Suicidal Intent: No Is patient at risk for suicide?: No Suicidal Plan?: No Access to Means: No What has been your use of drugs/alcohol within the last 12 months?: none Previous Attempts/Gestures: No How many times?: 0 Other Self Harm Risks: none Triggers for Past Attempts:   (none) Intentional Self Injurious Behavior: None Family Suicide History: No Recent stressful life event(s): Other (Comment) (pt denies stressors) Persecutory voices/beliefs?: No Depression: No Substance abuse history and/or treatment for substance abuse?: No Suicide prevention information given to non-admitted patients: Not applicable  Risk to Others Homicidal Ideation: No Thoughts of Harm to Others: No Current Homicidal Intent: No Current Homicidal Plan: No Access to Homicidal Means: No Identified Victim: none History of harm to others?: No Assessment of Violence: On admission Violent Behavior Description: none - pt cooperative during assessment Does patient have access to weapons?: No Criminal Charges Pending?: No Does patient have a court date: No  Psychosis Hallucinations: None noted Delusions: None noted  Mental Status Report Appear/Hygiene: Other (Comment) (unremarkable) Eye Contact: Good Motor Activity: Freedom of movement Speech: Logical/coherent Level of Consciousness: Alert Mood: Other (Comment) (euthymic) Affect: Appropriate to circumstance Anxiety Level: Minimal Thought Processes: Coherent;Relevant;Circumstantial Judgement: Unimpaired Orientation: Person;Place;Time;Situation Obsessive Compulsive Thoughts/Behaviors: None  Cognitive Functioning Concentration: Decreased Memory: Remote Intact;Recent Intact IQ: Average Insight: Fair Impulse Control: Fair Appetite: Fair Weight Loss: 0 Weight Gain: 0 Sleep: No Change Total Hours of Sleep: 8 Vegetative Symptoms: None  ADLScreening [facility] Assessment Services) Patient's cognitive ability adequate to safely complete daily activities?: Yes Patient able to express need for assistance with ADLs?: Yes Independently performs ADLs?: Yes (appropriate for developmental age)  Abuse/Neglect [facility]) Physical Abuse: Denies Verbal Abuse: Denies Sexual Abuse: Denies  Prior Inpatient Therapy Prior Inpatient Therapy:  No Prior Therapy Dates: na Prior Therapy Facilty/Provider(s): na Reason for Treatment: na  Prior Outpatient Therapy Prior Outpatient Therapy: Yes Prior Therapy Dates: currently Prior Therapy Facilty/Provider(s): [facility], formerly at [facility] Reason for Treatment: depression, medication management  ADL Screening (condition at time of admission) Patient's cognitive ability adequate to safely complete daily activities?: Yes Patient able to express need for assistance with ADLs?: Yes Independently performs ADLs?: Yes (appropriate for developmental age) Weakness of Legs: None Weakness of Arms/Hands: None       Abuse/Neglect Assessment (Assessment to be complete while patient is alone) Physical Abuse: Denies Verbal Abuse: Denies Sexual Abuse: Denies Exploitation of patient/patient's resources: Denies Self-Neglect: Denies Values / Beliefs Cultural Requests During Hospitalization: None Spiritual Requests During Hospitalization: None   Advance Directives (For Healthcare) Advance Directive: Patient does not have advance directive    Additional Information 1:1 In Past 12 Months?: No CIRT Risk: No Elopement Risk: No Does patient have medical clearance?: Yes     Disposition:  Disposition Initial Assessment Completed for this Encounter: Yes Disposition of Patient: Referred to Other disposition(s): To current provider [facility])  On Site Evaluation by:   Reviewed with Physician:     Donnamarie Rossetti P 09/11/2012 12:08 AM

## 2012-09-11 NOTE — ED Notes (Signed)
telepsych in progress.  Pt angry, talking loudly at the psychiatrist during the session.

## 2012-09-11 NOTE — ED Notes (Signed)
Info fax and called to [facility]

## 2012-09-11 NOTE — ED Notes (Signed)
Pt up to the phone to talk w/ brother.  Much calmer, brother is coming to pick up

## 2012-09-11 NOTE — ED Notes (Signed)
Pt is aware that he will have a telepsych eval prior to being dc'd.

## 2012-09-11 NOTE — ED Notes (Signed)
Sandwich/coffee given

## 2012-09-11 NOTE — ED Notes (Signed)
Dr Freida Busman aware of family's concerns-order telepsych eval

## 2012-09-11 NOTE — ED Notes (Signed)
Up to the bathroom 

## 2012-09-19 ENCOUNTER — Encounter: Payer: Self-pay | Admitting: Family Medicine

## 2012-09-19 ENCOUNTER — Encounter: Payer: Self-pay | Admitting: *Deleted

## 2012-09-19 ENCOUNTER — Encounter: Payer: Self-pay | Admitting: Internal Medicine

## 2012-09-19 ENCOUNTER — Ambulatory Visit (INDEPENDENT_AMBULATORY_CARE_PROVIDER_SITE_OTHER): Payer: BC Managed Care – PPO | Admitting: Family Medicine

## 2012-09-19 VITALS — BP 114/70 | HR 70 | Ht 67.0 in | Wt 137.0 lb

## 2012-09-19 DIAGNOSIS — B351 Tinea unguium: Secondary | ICD-10-CM

## 2012-09-19 DIAGNOSIS — M7700 Medial epicondylitis, unspecified elbow: Secondary | ICD-10-CM

## 2012-09-19 DIAGNOSIS — H9319 Tinnitus, unspecified ear: Secondary | ICD-10-CM

## 2012-09-19 DIAGNOSIS — Z0000 Encounter for general adult medical examination without abnormal findings: Secondary | ICD-10-CM

## 2012-09-19 DIAGNOSIS — Z125 Encounter for screening for malignant neoplasm of prostate: Secondary | ICD-10-CM

## 2012-09-19 DIAGNOSIS — F329 Major depressive disorder, single episode, unspecified: Secondary | ICD-10-CM

## 2012-09-19 DIAGNOSIS — N400 Enlarged prostate without lower urinary tract symptoms: Secondary | ICD-10-CM

## 2012-09-19 DIAGNOSIS — H9313 Tinnitus, bilateral: Secondary | ICD-10-CM | POA: Insufficient documentation

## 2012-09-19 DIAGNOSIS — F32A Depression, unspecified: Secondary | ICD-10-CM | POA: Insufficient documentation

## 2012-09-19 DIAGNOSIS — M7701 Medial epicondylitis, right elbow: Secondary | ICD-10-CM

## 2012-09-19 LAB — LIPID PANEL
Cholesterol: 175 mg/dL (ref 0–200)
HDL: 48 mg/dL (ref >39)
Total CHOL/HDL Ratio: 3.6 Ratio

## 2012-09-19 MED ORDER — TERBINAFINE HCL 250 MG PO TABS: 250.0000 mg | ORAL_TABLET | Freq: Every day | ORAL | Status: DC

## 2012-09-19 NOTE — Progress Notes (Signed)
Subjective:    Patient ID: Nathan Hunt, male    DOB: Feb 06, 1951, 62 y.o.   MRN: [id number]  HPI He is here after a couple of year absence. He was recently seen in the emergency room for hallucinations. He is being followed at the [facility] department by Dr. Marin Olp  Presently he is on PRISTIQ, trazodone and she is tapering him off of clonazepam. He is happy with the response he is getting. He does note some difficulty with ejaculation since being on this medication. He also complains of decreased stream and hesitancy. He has had difficulty with right elbow pain for the last 2 years. [facility] He did state that prior to this he did a lot of lifting and moving. He complains of medial elbow discomfort. He also complains of finger and toenail fungal infection. This was addressed several years ago however he could not afford the medication to treat it. He has had a several year history of difficulty with ringing in ears and does plan to go back to the [facility] tinnitus clinic to followup on this.   Review of Systems Negative except as above    Objective:   Physical Exam BP 114/70  Pulse 70  Ht 5\' 7"  (1.702 m)  Wt 137 lb (62.143 kg)  BMI 21.45 kg/m2  General Appearance:    Alert, cooperative, no distress, appears stated age, his speech is somewhat rapid and to a certain extent flight of ideas.  Head:    Normocephalic, without obvious abnormality, atraumatic  Eyes:    PERRL, conjunctiva/corneas clear, EOM's intact, fundi    benign  Ears:    Normal TM's and external ear canals  Nose:   Nares normal, mucosa normal, no drainage or sinus   tenderness  Throat:   Lips, mucosa, and tongue normal; teeth and gums normal  Neck:   Supple, no lymphadenopathy;  thyroid:  no   enlargement/tenderness/nodules; no carotid   bruit or JVD  Back:    Spine nontender, no curvature, ROM normal, no CVA     tenderness  Lungs:     Clear to auscultation bilaterally  without wheezes, rales or     ronchi; respirations unlabored  Chest Wall:    No tenderness or deformity   Heart:    Regular rate and rhythm, S1 and S2 normal, no murmur, rub   or gallop  Breast Exam:    No chest wall tenderness, masses or gynecomastia  Abdomen:     Soft, non-tender, nondistended, normoactive bowel sounds,    no masses, no hepatosplenomegaly  Genitalia:   deferred  Rectal:    Normal sphincter tone, no masses or tenderness; guaiac negative stool.  Prostate smooth, no nodules, enlarged.  Extremities:   No clubbing, cyanosis or edema. The nails on the right handed do show thickening and some onycolysis.he also has thickening of the toenails. exam of the right elbow does show tenderness over the medial epicondyle proximal and lateral. Normal motion. No tenderness over the lateral epicondyle.  Pulses:   2+ and symmetric all extremities  Skin:   Skin color, texture, turgor normal, no rashes or lesions  Lymph nodes:   Cervical, supraclavicular, and axillary nodes normal  Neurologic:   CNII-XII intact, normal strength, sensation and gait; reflexes 2+ and symmetric throughout          Psych:   Normal mood, affect, hygiene and grooming.  Assessment & Plan:  Onychomycosis - Plan: terbinafine (LAMISIL) 250 MG tablet  Tinnitus of both ears  Depression  Routine general medical examination at a health care facility - Plan: HM COLONOSCOPY, Lipid panel  Special screening for malignant neoplasm of prostate - Plan: PSA  BPH (benign prostatic hyperplasia)  Medial epicondylitis of elbow, right he is to continue to be followed by psychiatry. He will followup in the tinnitus clinic. I will probably place him on Proscar pending results of his PSA. [facility] I did ask him to have them send me a note concerning his elbow.

## 2012-09-19 NOTE — Patient Instructions (Signed)
Return here in 2 months for recheck on your hands and feet. Have [facility] send information concerning your elbow. Continue to work with your psychiatrist.

## 2012-09-20 ENCOUNTER — Other Ambulatory Visit: Payer: Self-pay

## 2012-09-20 MED ORDER — FINASTERIDE 5 MG PO TABS: 5.0000 mg | ORAL_TABLET | Freq: Every day | ORAL | Status: DC

## 2012-09-20 NOTE — Progress Notes (Signed)
Quick Note:  PT INFORMED WORD FOR WORD The lipids and PSA are good. Start him on Proscar 5 mg daily. Give him 5 refills and have him return here in 2 months for followup on his urinary symptoms. PROSCAR 5 MG WAS SENT IN AND HE SAID HE WOULD COME BY TODAY TO GET APPOINTMENT ______

## 2012-09-20 NOTE — Telephone Encounter (Signed)
SENT IN PROSCAR PER [facility]

## 2012-09-23 ENCOUNTER — Telehealth: Payer: Self-pay | Admitting: Family Medicine

## 2012-09-23 NOTE — Telephone Encounter (Signed)
Let him know that the wanted to continue to be followed by his psychiatrist.

## 2012-09-26 NOTE — Telephone Encounter (Signed)
LEFT MESSAGE FOR PT WORD FOR WORD  Let him know that the wanted to continue to be followed by his psychiatrist.

## 2012-10-20 ENCOUNTER — Telehealth: Payer: Self-pay

## 2012-10-20 ENCOUNTER — Ambulatory Visit (AMBULATORY_SURGERY_CENTER): Payer: BC Managed Care – PPO | Admitting: *Deleted

## 2012-10-20 ENCOUNTER — Encounter: Payer: Self-pay | Admitting: Internal Medicine

## 2012-10-20 VITALS — Ht 67.0 in | Wt 131.2 lb

## 2012-10-20 DIAGNOSIS — Z1211 Encounter for screening for malignant neoplasm of colon: Secondary | ICD-10-CM

## 2012-10-20 MED ORDER — MOVIPREP 100 G PO SOLR: ORAL | Status: DC

## 2012-10-20 NOTE — Telephone Encounter (Signed)
He needs to discuss this with his psychiatrist

## 2012-10-20 NOTE — Telephone Encounter (Signed)
PT SAID DR.DALLAS RX HIS TRAZODONE AND KLONOPIN PT STATES THAT HE HAS NOT NEEDED TO TAKE MED THAT HE IS EXERCISING  AND IS SLEEPING WELL AND SAID SHE WILL MOST LIKELY   DISCHARGE HIM SHE IS ONLY SEEING HER FOR SLEEP SAID HIS INS WILL NOT PAY FOR PRISTIQ ASKED IF YOU CAN RX HIM A MED SIMILAR TO IT(  EFFEXOR ER ,PROZAC AND OR ZOLOFT ) PHARMACY HAS TRIED TO GET IN CONTACT WITH DR.DALLAS AND HAVE NOT HEARD ANYTHING PLEASE ADVISE
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
## 2012-10-21 NOTE — Telephone Encounter (Signed)
He needs to handle this through his psychiatrist. I cannot help

## 2012-10-25 ENCOUNTER — Telehealth: Payer: Self-pay | Admitting: Family Medicine

## 2012-10-25 ENCOUNTER — Telehealth: Payer: Self-pay

## 2012-10-25 ENCOUNTER — Telehealth: Payer: Self-pay | Admitting: Internal Medicine

## 2012-10-25 NOTE — Telephone Encounter (Signed)
Pt called and is transferring out. He will come by today to get his records

## 2012-10-25 NOTE — Telephone Encounter (Signed)
PT CALLED AND WANTED IT PUT IN HIS RECORDS THAT HE STOPPED ALL 3 OF THE MEDICATIONS THAT DR.DALLAS HAD RX HIM AND ALSO WANTED HIS RECORDS TO TAKE TO ANOTHER CLINIC SENT HIM TO [facility] TO TAKE CARE OF THIS.

## 2012-10-25 NOTE — Telephone Encounter (Signed)
Pt had called and left message for me to call him regarding a conversation we had 2 months ago about him not having insurance.  I have called pt and left 2 messages to ret call.

## 2012-11-03 ENCOUNTER — Encounter: Payer: BC Managed Care – PPO | Admitting: Internal Medicine

## 2012-11-03 ENCOUNTER — Encounter: Payer: Self-pay | Admitting: Internal Medicine

## 2012-11-04 ENCOUNTER — Telehealth: Payer: Self-pay | Admitting: Internal Medicine

## 2012-11-07 NOTE — Telephone Encounter (Signed)
If he reschedules, he will not be charged. Thanks

## 2012-11-10 ENCOUNTER — Ambulatory Visit: Payer: BC Managed Care – PPO | Admitting: Family Medicine

## 2012-11-17 ENCOUNTER — Ambulatory Visit (INDEPENDENT_AMBULATORY_CARE_PROVIDER_SITE_OTHER): Payer: BC Managed Care – PPO | Admitting: Physician Assistant

## 2012-11-17 VITALS — BP 151/82 | HR 60 | Temp 98.1°F | Resp 16 | Ht 68.0 in | Wt 142.0 lb

## 2012-11-17 DIAGNOSIS — L299 Pruritus, unspecified: Secondary | ICD-10-CM

## 2012-11-17 DIAGNOSIS — B86 Scabies: Secondary | ICD-10-CM

## 2012-11-17 MED ORDER — HYDROXYZINE HCL 10 MG PO TABS: 10.0000 mg | ORAL_TABLET | Freq: Three times a day (TID) | ORAL | Status: DC | PRN

## 2012-11-17 MED ORDER — IVERMECTIN 0.5 % EX LOTN: 1.0000 | TOPICAL_LOTION | Freq: Once | CUTANEOUS | Status: DC
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
## 2012-11-18 ENCOUNTER — Telehealth: Payer: Self-pay

## 2012-11-18 NOTE — Telephone Encounter (Signed)
Pt has questions regarding scabies, he would not give me the question so I could put it in this message. Best# [phone]

## 2012-11-18 NOTE — Telephone Encounter (Signed)
Patient advised he is okay to go to church tomorrow. Nathan Hunt

## 2012-12-20 ENCOUNTER — Ambulatory Visit (INDEPENDENT_AMBULATORY_CARE_PROVIDER_SITE_OTHER): Payer: BC Managed Care – PPO | Admitting: Physician Assistant

## 2012-12-20 ENCOUNTER — Telehealth: Payer: Self-pay

## 2012-12-20 VITALS — BP 128/72 | HR 65 | Temp 98.0°F | Resp 18 | Ht 68.0 in | Wt 147.0 lb

## 2012-12-20 DIAGNOSIS — N400 Enlarged prostate without lower urinary tract symptoms: Secondary | ICD-10-CM

## 2012-12-20 MED ORDER — FINASTERIDE 5 MG PO TABS: 5.0000 mg | ORAL_TABLET | Freq: Every day | ORAL | Status: DC

## 2012-12-20 NOTE — Progress Notes (Signed)
   Patient ID: Nathan Hunt MRN: [id number], DOB: 1951/05/26, 62 y.o. Date of Encounter: 12/20/2012, 3:17 PM  Primary Physician: Carollee Herter, MD  Chief Complaint: Medication refill  HPI: 62 y.o. male with history below presents for refill of finasteride 5 mg 1 po daily. Takes for BPH. Last PSA 1.40 on 09/19/12. Notes considerable improvement of urinary retention with medication. No adverse effects.   Scabies has resolved.    Past Medical History  Diagnosis Date  . Tinnitus   . Insomnia   . Depression   . Tendonitis   . Hypertension      Home Meds: Prior to Admission medications   Medication Sig Start Date End Date Taking? Authorizing Provider  cetirizine (ZYRTEC) 5 MG tablet Take 5 mg by mouth daily.   Yes Historical Provider, MD  finasteride (PROSCAR) 5 MG tablet Take 1 tablet (5 mg total) by mouth daily. 09/20/12  Yes Ronnald Nian, MD  Ivermectin 0.5 % LOTN Apply 1 Tube topically once. 11/17/12  Yes Aiden Helzer M Liya Strollo, PA-C  MOVIPREP 100 G SOLR moviprep as directed. No substitutions 10/20/12  Yes Beverley Fiedler, MD  terbinafine (LAMISIL) 250 MG tablet Take 1 tablet (250 mg total) by mouth daily. 09/19/12  Yes Ronnald Nian, MD           Allergies: No Known Allergies  History   Social History  . Marital Status: Single    Spouse Name: N/A    Number of Children: N/A  . Years of Education: N/A   Occupational History  . Not on file.   Social History Main Topics  . Smoking status: Never Smoker   . Smokeless tobacco: Never Used  . Alcohol Use: 0.0 oz/week    1-2 Cans of beer per week  . Drug Use: No  . Sexually Active: Not on file   Other Topics Concern  . Not on file   Social History Narrative  . No narrative on file     Review of Systems: Constitutional: negative for chills, fever, or fatigue  HEENT: negative for vision changes, hearing loss Cardiovascular: negative for chest pain or palpitations Respiratory: negative for wheezing, shortness of breath, or  cough Abdominal: negative for abdominal pain, nausea, vomiting, diarrhea, or constipation Genitourinary: see above  Dermatological: negative for rash Neurologic: negative for headache, dizziness, or syncope   Physical Exam: Blood pressure 128/72, pulse 65, temperature 98 F (36.7 C), temperature source Oral, resp. rate 18, height 5\' 8"  (1.727 m), weight 147 lb (66.679 kg), SpO2 99.00%., Body mass index is 22.36 kg/(m^2). General: Well developed, well nourished, in no acute distress. Head: Normocephalic, atraumatic, eyes without discharge, sclera non-icteric, nares are without discharge.   Neck: Supple. Full ROM.  Lungs: Clear bilaterally to auscultation without wheezes, rales, or rhonchi. Breathing is unlabored. Heart: RRR with S1 S2. No murmurs, rubs, or gallops appreciated. Msk:  Strength and tone normal for age. Extremities/Skin: Warm and dry. No clubbing or cyanosis. No edema. No rashes or suspicious lesions. Neuro: Alert and oriented X 3. Moves all extremities spontaneously. Gait is normal. CNII-XII grossly in tact. Psych:  Responds to questions appropriately with a normal affect.   Labs: reviewed PSA from 09/19/12. Will plan to trend PSA in 3 months   ASSESSMENT AND PLAN:  62 y.o. male with BPH -Much improved -Refilled finasteride 5 mg 1 po daily #30 RF 2 -Follow up 3 months for repeat PSA/urinary symptoms   Signed, Eula Listen, PA-C 12/20/2012 3:17 PM

## 2012-12-20 NOTE — Telephone Encounter (Signed)
Opened in error

## 2013-03-20 ENCOUNTER — Encounter: Payer: Self-pay | Admitting: Physician Assistant

## 2013-03-20 ENCOUNTER — Ambulatory Visit (INDEPENDENT_AMBULATORY_CARE_PROVIDER_SITE_OTHER): Payer: BC Managed Care – PPO | Admitting: Physician Assistant

## 2013-03-20 VITALS — BP 110/76 | HR 68 | Temp 98.2°F | Resp 16 | Ht 68.0 in | Wt 163.6 lb

## 2013-03-20 DIAGNOSIS — B351 Tinea unguium: Secondary | ICD-10-CM

## 2013-03-20 DIAGNOSIS — Z23 Encounter for immunization: Secondary | ICD-10-CM

## 2013-03-20 DIAGNOSIS — R351 Nocturia: Secondary | ICD-10-CM

## 2013-03-20 DIAGNOSIS — N400 Enlarged prostate without lower urinary tract symptoms: Secondary | ICD-10-CM

## 2013-03-20 LAB — COMPREHENSIVE METABOLIC PANEL
AST: 24 U/L (ref 0–37)
BUN: 20 mg/dL (ref 6–23)
Calcium: 9.8 mg/dL (ref 8.4–10.5)
Chloride: 103 mEq/L (ref 96–112)
Creat: 0.87 mg/dL (ref 0.50–1.35)
Glucose, Bld: 98 mg/dL (ref 70–99)

## 2013-03-20 LAB — CBC
HCT: 43.4 % (ref 39.0–52.0)
Hemoglobin: 15.4 g/dL (ref 13.0–17.0)
MCHC: 35.5 g/dL (ref 30.0–36.0)
MCV: 88.2 fL (ref 78.0–100.0)
RDW: 14.1 % (ref 11.5–15.5)
WBC: 5.6 10*3/uL (ref 4.0–10.5)

## 2013-03-20 MED ORDER — FINASTERIDE 5 MG PO TABS: 5.0000 mg | ORAL_TABLET | Freq: Every day | ORAL | Status: DC

## 2013-03-20 MED ORDER — TERBINAFINE HCL 250 MG PO TABS: 250.0000 mg | ORAL_TABLET | Freq: Every day | ORAL | Status: DC

## 2013-03-20 NOTE — Progress Notes (Signed)
Patient ID: Nathan Hunt MRN: [id number], DOB: [birth year], 62 y.o. Date of Encounter: 03/20/2013, 1:35 PM  Primary Physician: Carollee Herter, MD  Chief Complaint: Follow up for BPH  HPI: 62 y.o. male with history below presents for follow up of BPH. Currently on finasteride 5 mg 1 po daily. Taking daily without adverse effects. Notes considerable improvement of urinary retention with medication. Last PSA 1.40 on 09/19/12. He is otherwise doing well and without complaints. He does state that he has been getting up to void 1-2 times on most nights since our last office visit in July. He states that he has been eating later in the night and not sleeping well. It takes him 10-12 hours of laying in the bed to get 7-8 hours of sleep. He denies any polyuria, polyphagia, or polydipsia. He does have an appointment with with physician at [facility] on November 7th to discuss his sleeping issues. He prefers to not go on a controlled substance.   Requests influenza vaccine today.    Past Medical History  Diagnosis Date  . Tinnitus   . Insomnia   . Depression   . Tendonitis   . Hypertension   . BPH (benign prostatic hyperplasia)      Home Meds: Prior to Admission medications   Medication Sig Start Date End Date Taking? Authorizing Provider  cetirizine (ZYRTEC) 5 MG tablet Take 5 mg by mouth daily.    Historical Provider, MD  finasteride (PROSCAR) 5 MG tablet Take 1 tablet (5 mg total) by mouth daily. 12/20/12   Raymon Mutton Kermitt Harjo, PA-C                terbinafine (LAMISIL) 250 MG tablet Take 1 tablet (250 mg total) by mouth daily. 09/19/12   Ronnald Nian, MD    Allergies: No Known Allergies  History   Social History  . Marital Status: Single    Spouse Name: N/A    Number of Children: N/A  . Years of Education: N/A   Occupational History  . Not on file.   Social History Main Topics  . Smoking status: Never Smoker   . Smokeless tobacco: Never Used  . Alcohol Use: 0.0 oz/week    1-2  Cans of beer per week  . Drug Use: No  . Sexual Activity: Not on file   Other Topics Concern  . Not on file   Social History Narrative  . No narrative on file     Review of Systems: Constitutional: negative for chills, fever, or fatigue  HEENT: negative for vision changes, hearing loss, congestion, rhinorrhea, ST, or sinus pressure Cardiovascular: negative for chest pain or palpitations Respiratory: negative for wheezing, shortness of breath, or cough Abdominal: negative for abdominal pain, nausea, vomiting, diarrhea, or constipation Genitourinary: positive for nocturia. negative for urinary retention, decreased urinary stream, dysuria, urinary frequency, urinary urgency, discharge, or testicular pain/mass Dermatological: negative for rash Psychological: positive for insomnia. negative for anxiety, depression, anhedonia, SI, or HI    Neurologic: negative for headache, dizziness, or syncope   Physical Exam: Blood pressure 110/76, pulse 68, temperature 98.2 F (36.8 C), temperature source Oral, resp. rate 16, height 5\' 8"  (1.727 m), weight 163 lb 9.6 oz (74.208 kg), SpO2 100.00%., Body mass index is 24.88 kg/(m^2). General: Well developed, well nourished, in no acute distress. Head: Normocephalic, atraumatic, eyes without discharge, sclera non-icteric, nares are without discharge. Bilateral auditory canals clear, TM's are without perforation, pearly grey and translucent with reflective cone of light bilaterally. Oral  cavity moist, posterior pharynx without exudate, erythema, peritonsillar abscess, or post nasal drip.  Neck: Supple. No thyromegaly. Full ROM. No lymphadenopathy. Lungs: Clear bilaterally to auscultation without wheezes, rales, or rhonchi. Breathing is unlabored. Heart: RRR with S1 S2. No murmurs, rubs, or gallops appreciated. Msk:  Strength and tone normal for age. Extremities/Skin: Warm and dry. No clubbing or cyanosis. No edema. No rashes or suspicious lesions. Resolving  onychomycosis of the right hand involving digits 1-3.   Neuro: Alert and oriented X 3. Moves all extremities spontaneously. Gait is normal. CNII-XII grossly in tact. Psych:  Responds to questions appropriately with a normal affect.   Labs: CMP, CBC, and PSA pending  ASSESSMENT AND PLAN:  62 y.o. male with well controlled BPH  1) BPH -Well controlled -Continue finasteride 5 mg 1 po daily #30 RF 5 -Await labs  2) Nocturia -Likely secondary to recent lifestyle changes -Check labs  3) Influenza vaccine today  4) Onychomycosis -Resolving -Continue Lamisil 250 mg 1 po daily #90 RF 0   5) Insomnia -Has appointment with his physician at [facility] on November 7th -Prefers to not go on a controlled substance -Discussed better sleep hygiene    Signed, Eula Listen, PA-C [facility] [place], [place] [address] [phone] 03/20/2013 1:35 PM

## 2013-03-21 LAB — PSA: PSA: 0.65 ng/mL (ref <=4.00)

## 2013-03-22 ENCOUNTER — Other Ambulatory Visit: Payer: Self-pay | Admitting: Physician Assistant

## 2013-03-22 DIAGNOSIS — R39198 Other difficulties with micturition: Secondary | ICD-10-CM

## 2013-07-31 ENCOUNTER — Encounter: Payer: Self-pay | Admitting: Physician Assistant

## 2013-07-31 ENCOUNTER — Ambulatory Visit (INDEPENDENT_AMBULATORY_CARE_PROVIDER_SITE_OTHER): Payer: BC Managed Care – PPO | Admitting: Physician Assistant

## 2013-07-31 VITALS — BP 118/84 | HR 55 | Temp 98.1°F | Resp 16 | Ht 67.5 in | Wt 175.6 lb

## 2013-07-31 DIAGNOSIS — F329 Major depressive disorder, single episode, unspecified: Secondary | ICD-10-CM

## 2013-07-31 DIAGNOSIS — F3289 Other specified depressive episodes: Secondary | ICD-10-CM

## 2013-07-31 DIAGNOSIS — R002 Palpitations: Secondary | ICD-10-CM

## 2013-07-31 DIAGNOSIS — H9319 Tinnitus, unspecified ear: Secondary | ICD-10-CM

## 2013-07-31 DIAGNOSIS — H9313 Tinnitus, bilateral: Secondary | ICD-10-CM

## 2013-07-31 DIAGNOSIS — F32A Depression, unspecified: Secondary | ICD-10-CM

## 2013-07-31 DIAGNOSIS — Z0000 Encounter for general adult medical examination without abnormal findings: Secondary | ICD-10-CM

## 2013-07-31 LAB — IFOBT (OCCULT BLOOD): IFOBT: NEGATIVE

## 2013-07-31 LAB — POCT URINALYSIS DIPSTICK
BILIRUBIN UA: NEGATIVE
Glucose, UA: NEGATIVE
Leukocytes, UA: NEGATIVE
NITRITE UA: NEGATIVE
RBC UA: NEGATIVE
Spec Grav, UA: >=1.03
Urobilinogen, UA: 0.2
pH, UA: 5.5

## 2013-07-31 LAB — LIPID PANEL
Cholesterol: 242 mg/dL — ABNORMAL HIGH (ref 0–200)
HDL: 37 mg/dL — AB (ref >39)
LDL Cholesterol: 178 mg/dL — ABNORMAL HIGH (ref 0–99)
TRIGLYCERIDES: 137 mg/dL (ref <150)
Total CHOL/HDL Ratio: 6.5 Ratio
VLDL: 27 mg/dL (ref 0–40)

## 2013-07-31 LAB — CBC
HEMATOCRIT: 43.1 % (ref 39.0–52.0)
HEMOGLOBIN: 15 g/dL (ref 13.0–17.0)
MCH: 30.7 pg (ref 26.0–34.0)
MCHC: 34.8 g/dL (ref 30.0–36.0)
MCV: 88.1 fL (ref 78.0–100.0)
Platelets: 239 10*3/uL (ref 150–400)
RBC: 4.89 MIL/uL (ref 4.22–5.81)
RDW: 14.5 % (ref 11.5–15.5)
WBC: 7.9 10*3/uL (ref 4.0–10.5)

## 2013-07-31 LAB — COMPREHENSIVE METABOLIC PANEL
ALK PHOS: 76 U/L (ref 39–117)
ALT: 25 U/L (ref 0–53)
AST: 27 U/L (ref 0–37)
Albumin: 4.5 g/dL (ref 3.5–5.2)
BILIRUBIN TOTAL: 0.4 mg/dL (ref 0.2–1.2)
BUN: 27 mg/dL — AB (ref 6–23)
CO2: 27 mEq/L (ref 19–32)
Calcium: 9.2 mg/dL (ref 8.4–10.5)
Chloride: 105 mEq/L (ref 96–112)
Creat: 1.01 mg/dL (ref 0.50–1.35)
GLUCOSE: 95 mg/dL (ref 70–99)
Potassium: 4.3 mEq/L (ref 3.5–5.3)
Sodium: 142 mEq/L (ref 135–145)
Total Protein: 6.9 g/dL (ref 6.0–8.3)

## 2013-07-31 LAB — TSH: TSH: 4.525 u[IU]/mL — ABNORMAL HIGH (ref 0.350–4.500)

## 2013-07-31 NOTE — Progress Notes (Signed)
Patient ID: Nathan Hunt MRN: [id number], DOB: August 03, 1950 63 y.o. Date of Encounter: 08/01/2013, 9:39 AM  Primary Physician: Wyatt Haste, MD  Chief Complaint: Physical (CPE)  HPI: 63 y.o. male with history noted below here for CPE. Doing well. Last physical was 09/19/12.  1) BPH: Well controlled. Currently on finasteride 5 mg daily. No urinary issues.   2) Palpitations: He notes since his last visit at [facility] in January he has felt like his heart will race for a few seconds then go back to beating normal. This lasts for 1-2 seconds. It is not associated with any syncope or presyncope. No chest pain or SOB. No nausea, vomiting, or diaphoresis is associated. There were no medication adjustments at that visit. He states this happened to him about 20 years ago and after a thorough workup the cardiologist told him to cut back on his coffee, so he did and he felt better. He has already started to cut back on his coffee intake the past few days and is waiting to see if this helps.   3) Onychomycosis: Resolved. Stopped Lamisil back in October 2014.   4) Depression: Well controled. Managed by [employer]. Currently on Lexapro 10 mg daily.   5) Bilateral elbow pain: 4-5 month history of bilateral elbow pain along the antecubital space. No injury or trauma. Pain is described as an ache. No radiation. No swelling, ecchymosis, or erythema. He does feel like his arms are weaker. No numbness or tingling. Has not tried anything. He does have an appointment with [facility] to see if this will help.   6) CPE: No colonoscopy. Received his influenza vaccine on 03/20/13.   Review of Systems: Consitutional: No fever, chills, fatigue, night sweats, lymphadenopathy, or weight changes. Eyes: No visual changes, eye redness, or discharge. ENT/Mouth: Ears: No otalgia, tinnitus, hearing loss, discharge. Nose: No congestion, rhinorrhea, sinus pain, or epistaxis. Throat: No sore throat, post nasal  drip, or teeth pain. Cardiovascular: Positive for palpitations. No CP, diaphoresis, DOE, edema, orthopnea, PND. Respiratory: No cough, hemoptysis, SOB, or wheezing. Gastrointestinal: No anorexia, dysphagia, reflux, pain, nausea, vomiting, hematemesis, diarrhea, constipation, BRBPR, or melena. Genitourinary: No dysuria, frequency, urgency, hematuria, incontinence, nocturia, decreased urinary stream, discharge, impotence, or testicular pain/masses. Musculoskeletal: Weakness of the bilateral elbows. No decreased ROM, myalgias, stiffness, or joint swelling. Skin: No rash, erythema, lesion changes, pain, warmth, jaundice, or pruritis. Neurological: No headache, dizziness, syncope, seizures, tremors, memory loss, coordination problems, or paresthesias. Psychological: No anxiety, depression, hallucinations, SI/HI. Endocrine: No fatigue, polydipsia, polyphagia, polyuria, or known diabetes.   Past Medical History  Diagnosis Date  . Tinnitus   . Insomnia   . Depression   . Tendonitis   . Hypertension   . BPH (benign prostatic hyperplasia)      Past Surgical History  Procedure Laterality Date  . Tonsillectomy  1962    Home Meds:  Prior to Admission medications   Medication Sig Start Date End Date Taking? Authorizing Provider  cetirizine (ZYRTEC) 5 MG tablet Take 5 mg by mouth daily.   Yes Historical Provider, MD  escitalopram (LEXAPRO) 10 MG tablet Take 10 mg by mouth daily.   Yes Historical Provider, MD  finasteride (PROSCAR) 5 MG tablet Take 1 tablet (5 mg total) by mouth daily. 03/20/13  Yes Marymargaret Kirker M Marieann Zipp, PA-C           Allergies: No Known Allergies  History   Social History  . Marital Status: Single    Spouse Name: N/A  Number of Children: N/A  . Years of Education: N/A   Occupational History  . Not on file.   Social History Main Topics  . Smoking status: Never Smoker   . Smokeless tobacco: Never Used  . Alcohol Use: 0.0 oz/week    1-2 Cans of beer per week  . Drug  Use: No  . Sexual Activity: Not on file   Other Topics Concern  . Not on file   Social History Narrative  . No narrative on file    Family History  Problem Relation Age of Onset  . Heart disease Mother   . Heart disease Father   . Heart disease Sister   . Colon cancer Neg Hx     Physical Exam: Blood pressure 118/84, pulse 55, temperature 98.1 F (36.7 C), temperature source Oral, resp. rate 16, height 5' 7.5" (1.715 m), weight 175 lb 9.6 oz (79.652 kg), SpO2 99.00%.  General: Well developed, well nourished, in no acute distress. HEENT: Normocephalic, atraumatic. Conjunctiva pink, sclera non-icteric. Pupils 2 mm constricting to 1 mm, round, regular, and equally reactive to light and accomodation. EOMI. Internal auditory canal clear. TMs with good cone of light and without pathology. Nasal mucosa pink. Nares are without discharge. No sinus tenderness. Oral mucosa pink. Dentition normal. Pharynx without exudate.   Neck: Supple. Trachea midline. No thyromegaly. Full ROM. No lymphadenopathy. Lungs: Clear to auscultation bilaterally without wheezes, rales, or rhonchi. Breathing is of normal effort and unlabored. Cardiovascular: RRR with S1 S2. No murmurs, rubs, or gallops appreciated. Distal pulses 2+ symmetrically. No carotid or abdominal bruits. Abdomen: Soft, non-tender, non-distended with normoactive bowel sounds. No hepatosplenomegaly or masses. No rebound/guarding. No CVA tenderness. Without hernias.  Rectal: No external hemorrhoids or fissures. Rectal vault without masses. Prostate not enlarged, smooth, symmetrical, without nodules, or TTP.  Genitourinary: Circumcised male. No penile lesions. Testes descended bilaterally, and smooth without tenderness or masses.  Musculoskeletal: Full range of motion and 5/5 strength throughout including upper extremities. No bony TTP. Without swelling, atrophy, tenderness, crepitus, or warmth. Extremities without clubbing, cyanosis, or edema. Calves  supple. Skin: Warm and moist without erythema, ecchymosis, wounds, or rash. Neuro: A+Ox3. CN II-XII grossly intact. Moves all extremities spontaneously. Full sensation throughout. Normal gait. DTR 2+ throughout upper and lower extremities. Finger to nose intact. Psych:  Responds to questions appropriately with a normal affect.   Studies:    CBC, CMET, Lipid, PSA, TSH all pending. Patient is fasting.  EKG: Sinus bradycardia.   Assessment/Plan:  63 y.o. male here for CPE with palpitations, BPH, depression, history of onychomycosis, bilateral tinnitus, and bilateral elbow pain.   1) Palpitations  -Cardiology referral -Decrease caffeine intake  2) BPH -Well controlled -Finasteride 5 mg daily #30 RF 5 -Await labs  3) Depression -Well controlled -Continue to follow up with [facility] -Currently on Lexapro 10 mg daily  4) Onychomycosis  -Resolved -Stopped Lamisil back in October 2014 -Offered podiatry referral, patient declined   5) Bilateral tinnitus -Long standing issue -Patient is ok with this  6) Bilateral elbow pain -Patient has an appointment with [facility] -If he continues to has discomfort advised him to RTC for full evaluation  7) CPE -Colonoscopy not yet done as patient cancel his referral from his previous provider -Colonoscopy referral done -Influenza vaccine up to date -Healthy diet and exercise -Weight loss -Age appropriate anticipatory guidance    Signed, Christell Faith, PA-C [facility] [place], [place] [address] [phone] 08/01/2013 9:39 AM

## 2013-08-01 ENCOUNTER — Other Ambulatory Visit: Payer: Self-pay | Admitting: Physician Assistant

## 2013-08-01 DIAGNOSIS — E785 Hyperlipidemia, unspecified: Secondary | ICD-10-CM

## 2013-08-01 LAB — PSA: PSA: 0.76 ng/mL (ref <=4.00)

## 2013-08-01 MED ORDER — ATORVASTATIN CALCIUM 20 MG PO TABS: 20.0000 mg | ORAL_TABLET | Freq: Every day | ORAL | Status: DC

## 2013-08-07 ENCOUNTER — Encounter: Payer: Self-pay | Admitting: Cardiology

## 2013-08-07 ENCOUNTER — Ambulatory Visit (INDEPENDENT_AMBULATORY_CARE_PROVIDER_SITE_OTHER): Payer: BC Managed Care – PPO | Admitting: Cardiology

## 2013-08-07 VITALS — BP 122/84 | HR 63 | Ht 68.0 in | Wt 175.0 lb

## 2013-08-07 DIAGNOSIS — R002 Palpitations: Secondary | ICD-10-CM

## 2013-08-07 NOTE — Progress Notes (Signed)
[place]. [address], [address] [place], [place]  [address] Phone: [phone] Fax:  [phone]  Date:  08/07/2013   ID:  KAMSIYOCHUKWU SPICKLER, DOB 09-09-1950, MRN [id number]  PCP:  Wyatt Haste, MD   History of Present Illness: Nathan Hunt is a 63 y.o. male here for the evaluation of palpitations. He is a sensation of heart racing for a few seconds duration, sudden onset and termination. Usually approximately 1-2 seconds in duration. No associated symptoms such as chest pain, shortness of breath, syncope or diaphoresis. No new medication changes. Possibly 20 years ago he went through a workup at a cardiologist's office and was told to cut back on his coffee. This has been gong on over past few months. Sat around this winter he states. Rapid heart beat in the evenings off and on.   Wt Readings from Last 3 Encounters:  08/07/13 175 lb (79.379 kg)  07/31/13 175 lb 9.6 oz (79.652 kg)  03/20/13 163 lb 9.6 oz (74.208 kg)     Past Medical History  Diagnosis Date  . Tinnitus   . Insomnia   . Depression   . Tendonitis   . Hypertension   . BPH (benign prostatic hyperplasia)     Past Surgical History  Procedure Laterality Date  . Tonsillectomy  1962    Current Outpatient Prescriptions  Medication Sig Dispense Refill  . atorvastatin (LIPITOR) 20 MG tablet Take 1 tablet (20 mg total) by mouth daily.  30 tablet  5  . cetirizine (ZYRTEC) 5 MG tablet Take 5 mg by mouth daily.      Marland Kitchen escitalopram (LEXAPRO) 10 MG tablet Take 10 mg by mouth daily.       No current facility-administered medications for this visit.    Allergies:   No Known Allergies  Social History:  The patient  reports that he has never smoked. He has never used smokeless tobacco. He reports that he drinks alcohol. He reports that he does not use illicit drugs.   Family History  Problem Relation Age of Onset  . Heart disease Mother   . Heart disease Father   . Heart disease Sister   . Colon cancer Neg  Hx     ROS:  Please see the history of present illness.   Continuous tinnitus at 7500 Hz, no fevers, no chills, no orthopnea, no PND, no strokelike symptoms.  All other systems reviewed and negative.   PHYSICAL EXAM: VS:  BP 122/84  Pulse 63  Ht 5\' 8"  (1.727 m)  Wt 175 lb (79.379 kg)  BMI 26.61 kg/m2 Well nourished, well developed, in no acute distress HEENT: normal, [place]/AT, EOMI Neck: no JVD, normal carotid upstroke, no bruit Cardiac:  normal S1, S2; RRR; no murmur Lungs:  clear to auscultation bilaterally, no wheezing, rhonchi or rales Abd: soft, nontender, no hepatomegaly, no bruits Ext: no edema, 2+ distal pulses Skin: warm and dry GU: deferred Neuro: no focal abnormalities noted, AAO x 3  EKG: 08/07/13 -  Sinus rhythm rate 63 with normal intervals.    ASSESSMENT AND PLAN:  1. Palpitations - overall, no high-risk symptoms and he is had lessened severity after decreasing his caffeine use. Likely PVCs/PACs or perhaps PAT. He does state that at one point at urgent care they caught him in a heart rate of 154 beats per minute however I do not see record of this documented. He knows not to take Sudafed or decongestants containing pseudoephedrine. No syncope, no chest  pain, no high-risk symptoms. If symptoms persist or become more worrisome, we could try low-dose beta blocker or calcium channel blocker to suppress. We could also try monitoring him to catch an episode however I do not think that monitoring is necessary at this time. I do not appreciate any murmurs, no shortness of breath with activity therefore echocardiogram is not warranted at this time. EKG is reassuring. 2. We will see him back on as-needed basis.    Signed, Candee Furbish, MD [facility]  08/07/2013 11:21 AM

## 2013-08-07 NOTE — Patient Instructions (Signed)
Your physician recommends that you continue on your current medications as directed. Please refer to the Current Medication list given to you today.  Your physician recommends that you follow-up as needed.  

## 2014-03-29 ENCOUNTER — Ambulatory Visit (INDEPENDENT_AMBULATORY_CARE_PROVIDER_SITE_OTHER): Payer: BC Managed Care – PPO | Admitting: Family Medicine

## 2014-03-29 VITALS — BP 136/84 | HR 66 | Temp 98.5°F | Resp 18 | Ht 69.5 in | Wt 176.4 lb

## 2014-03-29 DIAGNOSIS — M25521 Pain in right elbow: Secondary | ICD-10-CM

## 2014-03-29 DIAGNOSIS — Z23 Encounter for immunization: Secondary | ICD-10-CM

## 2014-03-29 DIAGNOSIS — Z13220 Encounter for screening for lipoid disorders: Secondary | ICD-10-CM

## 2014-03-29 DIAGNOSIS — E785 Hyperlipidemia, unspecified: Secondary | ICD-10-CM

## 2014-03-29 DIAGNOSIS — Z760 Encounter for issue of repeat prescription: Secondary | ICD-10-CM

## 2014-03-29 LAB — COMPREHENSIVE METABOLIC PANEL
ALK PHOS: 74 U/L (ref 39–117)
ALT: 49 U/L (ref 0–53)
AST: 34 U/L (ref 0–37)
Albumin: 4.7 g/dL (ref 3.5–5.2)
BILIRUBIN TOTAL: 0.8 mg/dL (ref 0.2–1.2)
BUN: 19 mg/dL (ref 6–23)
CO2: 27 meq/L (ref 19–32)
CREATININE: 0.91 mg/dL (ref 0.50–1.35)
Calcium: 9.8 mg/dL (ref 8.4–10.5)
Chloride: 104 mEq/L (ref 96–112)
GLUCOSE: 96 mg/dL (ref 70–99)
Potassium: 5 mEq/L (ref 3.5–5.3)
SODIUM: 142 meq/L (ref 135–145)
Total Protein: 7.2 g/dL (ref 6.0–8.3)

## 2014-03-29 LAB — LIPID PANEL
Cholesterol: 152 mg/dL (ref 0–200)
HDL: 34 mg/dL — ABNORMAL LOW (ref >39)
LDL Cholesterol: 91 mg/dL (ref 0–99)
TRIGLYCERIDES: 137 mg/dL (ref <150)
Total CHOL/HDL Ratio: 4.5 Ratio
VLDL: 27 mg/dL (ref 0–40)

## 2014-03-29 MED ORDER — ATORVASTATIN CALCIUM 20 MG PO TABS: 20.0000 mg | ORAL_TABLET | Freq: Every day | ORAL | Status: DC

## 2014-03-29 NOTE — Addendum Note (Signed)
Addended by: Frutoso Chase A on: 03/29/2014 04:51 PM   Modules accepted: Orders

## 2014-03-29 NOTE — Progress Notes (Signed)
Subjective: 63 year old patient who formerly was cared for by Christell Faith who has moved. The patient has been doing well. He has been having less trouble with his elbow. He has been going to integrative therapy and getting help from that. He has been released to do more activities such as work on his lawn. 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 He has been taking the atorvastatin and tolerating it well. His blood problems have been his tinnitus and associated depression, but he is doing well in life now. He never did get his colonoscopy as previously planned due to other things going on in his life, but he is willing to go ahead and get it.  Does continue to have pain in the medial aspect of his right elbow and wants me to check that.  Objective: Pleasant alert healthy man in no acute distress. Chest clear. Heart regular without murmurs. No CVA tenderness. Mild tenderness right medial epicondyles. He apparently had an old fracture there also.  Assessment: Hyperlipidemia History of anxiety and tinnitus and depression and sleep disturbance Medial epicondylitis  Plan: Continue him on the atorvastatin at this time. We will see how his lipid levels do on the current dose of the medication and decide whether we need to leave him they are make any changes up or down. He will contact gastroenterology to reschedule a colonoscopy according to his time calendar.  Plan to return in 6 months if still in the area.  Continue the exercises for his elbow.  Flu shot.

## 2014-03-29 NOTE — Patient Instructions (Addendum)
Continue current medication  Get the colonoscopy some time  We will let you know the results of your labs next week  Return in 6 months

## 2014-12-11 ENCOUNTER — Other Ambulatory Visit: Payer: Self-pay | Admitting: Physician Assistant

## 2014-12-12 NOTE — Telephone Encounter (Signed)
Who is primary care doctor, if not Linna Darner anymore needs to get prescription from new PCP

## 2015-02-16 ENCOUNTER — Other Ambulatory Visit: Payer: Self-pay | Admitting: Physician Assistant

## 2015-02-20 ENCOUNTER — Ambulatory Visit: Payer: Self-pay

## 2015-02-20 ENCOUNTER — Ambulatory Visit (INDEPENDENT_AMBULATORY_CARE_PROVIDER_SITE_OTHER): Payer: BLUE CROSS/BLUE SHIELD | Admitting: Family Medicine

## 2015-02-20 VITALS — BP 114/70 | HR 80 | Temp 98.5°F | Resp 16 | Ht 69.5 in | Wt 182.0 lb

## 2015-02-20 DIAGNOSIS — Z23 Encounter for immunization: Secondary | ICD-10-CM

## 2015-02-20 DIAGNOSIS — Z1211 Encounter for screening for malignant neoplasm of colon: Secondary | ICD-10-CM | POA: Diagnosis not present

## 2015-02-20 DIAGNOSIS — J301 Allergic rhinitis due to pollen: Secondary | ICD-10-CM

## 2015-02-20 DIAGNOSIS — E785 Hyperlipidemia, unspecified: Secondary | ICD-10-CM | POA: Diagnosis not present

## 2015-02-20 DIAGNOSIS — N400 Enlarged prostate without lower urinary tract symptoms: Secondary | ICD-10-CM | POA: Diagnosis not present

## 2015-02-20 DIAGNOSIS — Z0000 Encounter for general adult medical examination without abnormal findings: Secondary | ICD-10-CM | POA: Diagnosis not present

## 2015-02-20 LAB — POCT CBC
GRANULOCYTE PERCENT: 67.8 % (ref 37–80)
HCT, POC: 45 % (ref 43.5–53.7)
Hemoglobin: 14.8 g/dL (ref 14.1–18.1)
Lymph, poc: 1.9 (ref 0.6–3.4)
MCH: 29.2 pg (ref 27–31.2)
MCHC: 33 g/dL (ref 31.8–35.4)
MCV: 88.4 fL (ref 80–97)
MID (cbc): 0.6 (ref 0–0.9)
MPV: 6.7 fL (ref 0–99.8)
PLATELET COUNT, POC: 240 10*3/uL (ref 142–424)
POC Granulocyte: 5.2 (ref 2–6.9)
POC LYMPH PERCENT: 24.5 %L (ref 10–50)
POC MID %: 7.7 %M (ref 0–12)
RBC: 5.09 M/uL (ref 4.69–6.13)
RDW, POC: 14.6 %
WBC: 7.6 10*3/uL (ref 4.6–10.2)

## 2015-02-20 MED ORDER — ZOSTER VACCINE LIVE 19400 UNT/0.65ML ~~LOC~~ SOLR: 0.6500 mL | Freq: Once | SUBCUTANEOUS | Status: DC

## 2015-02-20 MED ORDER — ATORVASTATIN CALCIUM 20 MG PO TABS: 20.0000 mg | ORAL_TABLET | Freq: Every day | ORAL | Status: DC

## 2015-02-20 NOTE — Patient Instructions (Signed)
Colonoscopy  A colonoscopy is an exam to look at the entire large intestine (colon). This exam can help find problems such as tumors, polyps, inflammation, and areas of bleeding. The exam takes about 1 hour.   LET YOUR HEALTH CARE PROVIDER KNOW ABOUT:   · Any allergies you have.  · All medicines you are taking, including vitamins, herbs, eye drops, creams, and over-the-counter medicines.  · Previous problems you or members of your family have had with the use of anesthetics.  · Any blood disorders you have.  · Previous surgeries you have had.  · Medical conditions you have.  RISKS AND COMPLICATIONS   Generally, this is a safe procedure. However, as with any procedure, complications can occur. Possible complications include:  · Bleeding.  · Tearing or rupture of the colon wall.  · Reaction to medicines given during the exam.  · Infection (rare).  BEFORE THE PROCEDURE   · Ask your health care provider about changing or stopping your regular medicines.  · You may be prescribed an oral bowel prep. This involves drinking a large amount of medicated liquid, starting the day before your procedure. The liquid will cause you to have multiple loose stools until your stool is almost clear or light green. This cleans out your colon in preparation for the procedure.  · Do not eat or drink anything else once you have started the bowel prep, unless your health care provider tells you it is safe to do so.  · Arrange for someone to drive you home after the procedure.  PROCEDURE   · You will be given medicine to help you relax (sedative).  · You will lie on your side with your knees bent.  · A long, flexible tube with a light and camera on the end (colonoscope) will be inserted through the rectum and into the colon. The camera sends video back to a computer screen as it moves through the colon. The colonoscope also releases carbon dioxide gas to inflate the colon. This helps your health care provider see the area better.  · During  the exam, your health care provider may take a small tissue sample (biopsy) to be examined under a microscope if any abnormalities are found.  · The exam is finished when the entire colon has been viewed.  AFTER THE PROCEDURE   · Do not drive for 24 hours after the exam.  · You may have a small amount of blood in your stool.  · You may pass moderate amounts of gas and have mild abdominal cramping or bloating. This is caused by the gas used to inflate your colon during the exam.  · Ask when your test results will be ready and how you will get your results. Make sure you get your test results.  Document Released: 05/15/2000 Document Revised: 03/08/2013 Document Reviewed: 01/23/2013  [employer]® Patient Information ©2015 [employer]. This information is not intended to replace advice given to you by your health care provider. Make sure you discuss any questions you have with your health care provider.

## 2015-02-20 NOTE — Progress Notes (Signed)
Patient ID: Nathan Hunt MRN: [id number], DOB: April 26, 1951 64 y.o. Date of Encounter: 02/20/2015, 5:36 PM  Primary Physician: Wyatt Haste, MD  Chief Complaint: Physical (CPE)  HPI: 64 y.o. male with history noted below here for CPE. Doing well. Last physical was 09/19/12.  1) BPH: Well controlled. Currently on finasteride 5 mg daily. No urinary issues.  2) CPE:. Received his influenza vaccine on 03/29/14 but has yet to have zostavax.  UTD on TDap and has not had colonoscopy to date (financial reasons)   Review of Systems: Consitutional: No fever, chills, fatigue, night sweats, lymphadenopathy, or weight changes. Eyes: No visual changes, eye redness, or discharge. ENT/Mouth: Ears: No otalgia, tinnitus, hearing loss, discharge. Nose: No congestion, rhinorrhea, sinus pain, or epistaxis. Throat: No sore throat, post nasal drip, or teeth pain. Cardiovascular: Positive for palpitations. No CP, diaphoresis, DOE, edema, orthopnea, PND. Respiratory: No cough, hemoptysis, SOB, or wheezing. Gastrointestinal: No anorexia, dysphagia, reflux, pain, nausea, vomiting, hematemesis, diarrhea, constipation, BRBPR, or melena. Genitourinary: No dysuria, frequency, urgency, hematuria, incontinence, nocturia, decreased urinary stream, discharge, impotence, or testicular pain/masses. Musculoskeletal: Weakness of the bilateral elbows. No decreased ROM, myalgias, stiffness, or joint swelling. Skin: No rash, erythema, lesion changes, pain, warmth, jaundice, or pruritis. Neurological: No headache, dizziness, syncope, seizures, tremors, memory loss, coordination problems, or paresthesias. Psychological: No anxiety, depression, hallucinations, SI/HI. Endocrine: No fatigue, polydipsia, polyphagia, polyuria, or known diabetes.   Past Medical History  Diagnosis Date  . Tinnitus   . Insomnia   . Depression   . Tendonitis   . Hypertension   . BPH (benign prostatic hyperplasia)      Past Surgical  History  Procedure Laterality Date  . Tonsillectomy  1962    Home Meds:                                         Current outpatient prescriptions:  .  atorvastatin (LIPITOR) 20 MG tablet, Take 1 tablet (20 mg total) by mouth daily. NO MORE REFILLS WITHOUT OFFICE VISIT - 2ND NOTICE, Disp: 15 tablet, Rfl: 0 .  cetirizine (ZYRTEC) 5 MG tablet, Take 5 mg by mouth daily., Disp: , Rfl:  .  escitalopram (LEXAPRO) 10 MG tablet, Take 10 mg by mouth daily., Disp: , Rfl:    Allergies: No Known Allergies  Social History   Social History  . Marital Status: Single    Spouse Name: N/A  . Number of Children: N/A  . Years of Education: N/A   Occupational History  . Not on file.   Social History Main Topics  . Smoking status: Never Smoker   . Smokeless tobacco: Never Used  . Alcohol Use: 0.0 oz/week    1-2 Cans of beer per week  . Drug Use: No  . Sexual Activity: Not on file   Other Topics Concern  . Not on file   Social History Narrative    Family History  Problem Relation Age of Onset  . Heart disease Mother   . Heart disease Father   . Heart disease Sister   . Colon cancer Neg Hx     Physical Exam: Blood pressure 114/70, pulse 80, temperature 98.5 F (36.9 C), temperature source Oral, resp. rate 16, height 5' 9.5" (1.765 m), weight 182 lb (82.555 kg), SpO2 98 %.  General: Well developed, well nourished, in no acute distress. HEENT: Normocephalic, atraumatic. Conjunctiva pink, sclera non-icteric. Pupils 2  mm constricting to 1 mm, round, regular, and equally reactive to light and accomodation. EOMI. Internal auditory canal clear. TMs with good cone of light and without pathology. Nasal mucosa pink. Nares are without discharge. No sinus tenderness. Oral mucosa pink. Dentition normal. Pharynx without exudate.   Neck: Supple. Trachea midline. No thyromegaly. Full ROM. No lymphadenopathy. Lungs: Clear to auscultation bilaterally without wheezes, rales, or rhonchi. Breathing is  of normal effort and unlabored. Cardiovascular: RRR with S1 S2. No murmurs, rubs, or gallops appreciated. Distal pulses 2+ symmetrically. No carotid or abdominal bruits. Abdomen: Soft, non-tender, non-distended with normoactive bowel sounds. No hepatosplenomegaly or masses. No rebound/guarding. No CVA tenderness. Without hernias.  Rectal: No external hemorrhoids or fissures. Rectal vault without masses. Prostate not enlarged, smooth, symmetrical, without nodules, or TTP.  Genitourinary: Circumcised male. No penile lesions. Testes descended bilaterally, and smooth without tenderness or masses.  Musculoskeletal: Full range of motion and 5/5 strength throughout including upper extremities. No bony TTP. Without swelling, atrophy, tenderness, crepitus, or warmth. Extremities without clubbing, cyanosis, or edema. Calves supple. Skin: Warm and moist without erythema, ecchymosis, wounds, or rash. Neuro: A+Ox3. CN II-XII grossly intact. Moves all extremities spontaneously. Full sensation throughout. Normal gait. DTR 2+ throughout upper and lower extremities. Finger to nose intact. Psych:  Responds to questions appropriately with a normal affect.   Studies:  CBC, CMET, Lipid. Patient is fasting.  Assessment/Plan:  64 y.o. male here for CPE with BPH, hyperlipidemia and colon CA screening.    1) BPH -Well controlled -Finasteride 5 mg daily #30 RF  -Await labs  2) Hyperlipidemia  - Obtain Lipid today, CMET, CBC - Continue with lipitor   3) CPE -Colonoscopy not done per his report.  Will place another referral for him to have this done.  Explained the importance of preventative screening and pt again reiterated that he will get this done.  -Influenza vaccine today along with zostavax -Healthy diet and exercise -Weight loss -Age appropriate anticipatory guidance   Gaspar Bidding R. Awanda Mink, DO of [facility] 02/20/2015, 5:36 PM

## 2015-02-21 LAB — CHOLESTEROL, TOTAL: Cholesterol: 220 mg/dL — ABNORMAL HIGH (ref 125–200)

## 2015-02-21 LAB — COMPREHENSIVE METABOLIC PANEL
ALT: 52 U/L — ABNORMAL HIGH (ref 9–46)
AST: 33 U/L (ref 10–35)
Albumin: 4.3 g/dL (ref 3.6–5.1)
Alkaline Phosphatase: 81 U/L (ref 40–115)
BILIRUBIN TOTAL: 0.6 mg/dL (ref 0.2–1.2)
BUN: 20 mg/dL (ref 7–25)
CHLORIDE: 104 mmol/L (ref 98–110)
CO2: 24 mmol/L (ref 20–31)
CREATININE: 1 mg/dL (ref 0.70–1.25)
Calcium: 9 mg/dL (ref 8.6–10.3)
Glucose, Bld: 119 mg/dL — ABNORMAL HIGH (ref 65–99)
Potassium: 4.1 mmol/L (ref 3.5–5.3)
Sodium: 138 mmol/L (ref 135–146)
Total Protein: 6.8 g/dL (ref 6.1–8.1)

## 2015-03-25 ENCOUNTER — Encounter: Payer: Self-pay | Admitting: Family Medicine

## 2015-03-25 DIAGNOSIS — J301 Allergic rhinitis due to pollen: Secondary | ICD-10-CM | POA: Insufficient documentation

## 2015-03-25 DIAGNOSIS — E785 Hyperlipidemia, unspecified: Secondary | ICD-10-CM | POA: Insufficient documentation

## 2015-08-29 DIAGNOSIS — E872 Acidosis: Secondary | ICD-10-CM | POA: Insufficient documentation

## 2015-08-29 DIAGNOSIS — R000 Tachycardia, unspecified: Secondary | ICD-10-CM | POA: Insufficient documentation

## 2015-08-29 DIAGNOSIS — E8729 Other acidosis: Secondary | ICD-10-CM | POA: Insufficient documentation

## 2015-08-29 DIAGNOSIS — G9340 Encephalopathy, unspecified: Secondary | ICD-10-CM | POA: Insufficient documentation

## 2015-09-06 ENCOUNTER — Ambulatory Visit (INDEPENDENT_AMBULATORY_CARE_PROVIDER_SITE_OTHER): Payer: BLUE CROSS/BLUE SHIELD | Admitting: Family Medicine

## 2015-09-06 VITALS — BP 126/80 | HR 98 | Temp 97.8°F | Resp 16 | Ht 69.5 in | Wt 173.0 lb

## 2015-09-06 DIAGNOSIS — H9313 Tinnitus, bilateral: Secondary | ICD-10-CM | POA: Diagnosis not present

## 2015-09-06 DIAGNOSIS — G4700 Insomnia, unspecified: Secondary | ICD-10-CM | POA: Diagnosis not present

## 2015-09-06 DIAGNOSIS — F32A Depression, unspecified: Secondary | ICD-10-CM

## 2015-09-06 DIAGNOSIS — R7302 Impaired glucose tolerance (oral): Secondary | ICD-10-CM

## 2015-09-06 DIAGNOSIS — F329 Major depressive disorder, single episode, unspecified: Secondary | ICD-10-CM

## 2015-09-06 DIAGNOSIS — J301 Allergic rhinitis due to pollen: Secondary | ICD-10-CM

## 2015-09-06 DIAGNOSIS — N400 Enlarged prostate without lower urinary tract symptoms: Secondary | ICD-10-CM

## 2015-09-06 DIAGNOSIS — F5104 Psychophysiologic insomnia: Secondary | ICD-10-CM | POA: Insufficient documentation

## 2015-09-06 DIAGNOSIS — E785 Hyperlipidemia, unspecified: Secondary | ICD-10-CM

## 2015-09-06 DIAGNOSIS — R7989 Other specified abnormal findings of blood chemistry: Secondary | ICD-10-CM | POA: Diagnosis not present

## 2015-09-06 DIAGNOSIS — R410 Disorientation, unspecified: Secondary | ICD-10-CM

## 2015-09-06 LAB — COMPREHENSIVE METABOLIC PANEL
ALK PHOS: 72 U/L (ref 40–115)
ALT: 35 U/L (ref 9–46)
AST: 33 U/L (ref 10–35)
Albumin: 4.8 g/dL (ref 3.6–5.1)
BUN: 15 mg/dL (ref 7–25)
CALCIUM: 9.6 mg/dL (ref 8.6–10.3)
CHLORIDE: 103 mmol/L (ref 98–110)
CO2: 23 mmol/L (ref 20–31)
Creat: 1.02 mg/dL (ref 0.70–1.25)
Glucose, Bld: 99 mg/dL (ref 65–99)
POTASSIUM: 4.3 mmol/L (ref 3.5–5.3)
Sodium: 138 mmol/L (ref 135–146)
Total Bilirubin: 0.9 mg/dL (ref 0.2–1.2)
Total Protein: 7.6 g/dL (ref 6.1–8.1)

## 2015-09-06 LAB — POCT URINALYSIS DIP (MANUAL ENTRY)
BILIRUBIN UA: NEGATIVE
Blood, UA: NEGATIVE
GLUCOSE UA: NEGATIVE
Leukocytes, UA: NEGATIVE
NITRITE UA: NEGATIVE
PH UA: 5.5
Protein Ur, POC: NEGATIVE
Spec Grav, UA: 1.025
Urobilinogen, UA: 0.2

## 2015-09-06 LAB — HEMOGLOBIN A1C
Hgb A1c MFr Bld: 5.6 % (ref <5.7)
MEAN PLASMA GLUCOSE: 114 mg/dL

## 2015-09-06 NOTE — Patient Instructions (Signed)
Call your psychiatrist for a follow-up appointment now.      IF you received an x-ray today, you will receive an invoice from [facility]. Please contact [facility] at [phone] with questions or concerns regarding your invoice.   IF you received labwork today, you will receive an invoice from [employer]. Please contact [facility] at [phone] with questions or concerns regarding your invoice.   Our billing staff will not be able to assist you with questions regarding bills from these companies.  You will be contacted with the lab results as soon as they are available. The fastest way to get your results is to activate your My Chart account. Instructions are located on the last page of this paperwork. If you have not heard from [place] regarding the results in 2 weeks, please contact this office.

## 2015-09-06 NOTE — Progress Notes (Signed)
Subjective:    Patient ID: Nathan Hunt, male    DOB: 08-31-1950, 65 y.o.   MRN: [id number]  09/06/2015  Follow-up   HPI This 65 y.o. male presents for [place] for recent hospitalization.    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  Primary doctor requests all treatment records; needs ROI.  Admitted at [facility] in [facility].    Was dehydrated and ammonia level was above 100.  Researched high ammonia level and saw cirrhosis, kidney failure.  Was disoriented and had run out of gas.  Was disoriented and dehydrated; drove around until got out of gas.  Alone and traveling to [place]. This is the third event for past 5-10 years; first time taken to [facility] by EMS.  Second time taken to [employer] [facility] by police.  This is the third circumstance.  Each circumstance was the same; disoriented and confused.  Has eleven years ago diagnosed with sudden onset tinnitus.  That is why taking Citalopram and Trazodone.  Has been through two tinnitus clinics at [facility].    Had been off schedule with sleep medication; trying to stay up late at night and get up early in the morning.  Have had a lot to do and likes to stay up late.  Had a big life transition by turning 66.  In process of moving to [place].  Now going to happily retire in nice house in [place].  Has decided not to move.  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  Four police cars came to rescue patient on side of highway.  Finally placed in police car and took to [employer]"; not only told pt that but everything about it was a Personnel officer.  The police took care of patient for roughly 24 hours.  Still does not recall events.  Was in a nice motel room; took battery out of cell phone after removing phone numbers out of phone.  There is a suspicion of  patients that battery was removed from cell phone. Then patient had to go to the front desk for assistance; then went to front desk and 911 was called.  Then someone else would come to front desk and help answer questions.  The last time went to front desk, stated that hungry and without money.  Then three people started getting patient food.  Started crying when food was collected; knew something was wrong with patient.  Then there was a police minivan that pulled up; took to ICU hospital.  Stayed there for 24 hours.  Pt was loud during admission; remembers some of it; layed pt down on something and had patient connected.  Not sure if they knew about ammonia level at that time; was pumping stuff into patient and it hurt horribly.  Does not recall being strapped down.  Warned that going to hurt; was really loud during process.   Passage of time was really off.  Pictured PCP face several times.  Discharge date 08-29-2015.   No previous neurology evaluation in the past.   Denies hallucinations with recent episode.  Psychiatrist is at [facility].  Wondering about tinnitus.  Renard Matter is a dear family friend; helped go through college.  Followed every three months.  Dr. Cristy Friedlander. No episodes of mania; no history of bipolar disorder; no manic depression dx.  Sleep issue is a major problem for patient.  Had been neglecting sleep hygiene.  Exercise and diet have been supplemental to trazodone for insomnia.  Has been resistant to raise dose of Trazodone due to fear of sedation in morning.  Reduces energy level as dose increases.  Now not resistant to it.  Has not been sleeping eight hours per night for six months.  No history of daily alcohol.  Will go one month without alcohol; never gets drunk at bar or restraraunt.  Will drink 2 beers or wine twice weekly.   Vocational rehab had patient undergo psychological evaluation.  Referred to [facility]  Worked in Engineer, technical sales at [employer].  Would travel to [place]; for social life. Out  of work for past 10-11 years.  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  Within past three years, lived like a "homeless person with a home"; "I walked everywhere".    While at [facility], would work all day Friday and stay up all weekend; would present for work on Monday without any sleep.   Review of Systems  Constitutional: Negative for fever, chills, diaphoresis, activity change, appetite change and fatigue.  Respiratory: Negative for cough and shortness of breath.   Cardiovascular: Negative for chest pain, palpitations and leg swelling.  Gastrointestinal: Negative for nausea, vomiting, abdominal pain and diarrhea.  Endocrine: Negative for cold intolerance, heat intolerance, polydipsia, polyphagia and polyuria.  Genitourinary: Negative for dysuria, hematuria, penile swelling, scrotal swelling, genital sores, penile pain and testicular pain.  Skin: Negative for color change, rash and wound.  Neurological: Negative for dizziness, tremors, seizures, syncope, facial asymmetry, speech difficulty, weakness, light-headedness, numbness and headaches.  Psychiatric/Behavioral: Positive for sleep disturbance. Negative for suicidal ideas, self-injury and dysphoric mood. The patient is not nervous/anxious.     Past Medical History  Diagnosis Date  . Tinnitus   . Insomnia   . Depression   . Tendonitis   . Hypertension   . BPH (benign prostatic hyperplasia)   . Hyperlipidemia    Past Surgical History  Procedure Laterality Date  . Tonsillectomy  1962   No Known Allergies Current Outpatient Prescriptions  Medication Sig Dispense Refill  . atorvastatin (LIPITOR) 20 MG tablet Take 1 tablet (20 mg total) by mouth daily. NO MORE REFILLS WITHOUT OFFICE VISIT - 2ND NOTICE 90 tablet 3  . cetirizine (ZYRTEC) 5 MG tablet Take 5 mg by mouth daily.    Marland Kitchen escitalopram (LEXAPRO) 10 MG tablet Take 10 mg by mouth daily.    . traZODone (DESYREL) 50 MG tablet Take 50 mg by mouth at bedtime.      . zoster vaccine live, PF, (ZOSTAVAX) 09811 UNT/0.65ML injection Inject 19,400 Units into the skin once. 1 each 0   No current facility-administered medications for this visit.   Social History   Social History  . Marital Status: Single    Spouse Name: N/A  . Number of Children: N/A  . Years of Education: N/A   Occupational History  . Not on file.   Social History Main Topics  . Smoking status: Never Smoker   . Smokeless tobacco: Never Used  . Alcohol Use: 0.0 oz/week    1-2 Cans of beer per week  . Drug Use: No  . Sexual Activity: Not on file   Other Topics Concern  . Not on file   Social History Narrative   Family History  Problem Relation Age of Onset  . Heart disease Mother   . Heart disease Father   .  Heart disease Sister   . Colon cancer Neg Hx        Objective:    BP 126/80 mmHg  Pulse 98  Temp(Src) 97.8 F (36.6 C) (Oral)  Resp 16  Ht 5' 9.5" (1.765 m)  Wt 173 lb (78.472 kg)  BMI 25.19 kg/m2  SpO2 98% Physical Exam  Constitutional: He is oriented to person, place, and time. He appears well-developed and well-nourished. No distress.  HENT:  Head: Normocephalic and atraumatic.  Right Ear: External ear normal.  Left Ear: External ear normal.  Nose: Nose normal.  Mouth/Throat: Oropharynx is clear and moist.  Eyes: Conjunctivae and EOM are normal. Pupils are equal, round, and reactive to light.  Neck: Normal range of motion. Neck supple. Carotid bruit is not present. No thyromegaly present.  Cardiovascular: Normal rate, regular rhythm, normal heart sounds and intact distal pulses.  Exam reveals no gallop and no friction rub.   No murmur heard. Pulmonary/Chest: Effort normal and breath sounds normal. He has no wheezes. He has no rales.  Abdominal: Soft. Bowel sounds are normal. He exhibits no distension and no mass. There is no tenderness. There is no rebound and no guarding.  Lymphadenopathy:    He has no cervical adenopathy.  Neurological: He is  alert and oriented to person, place, and time. No cranial nerve deficit. He exhibits normal muscle tone. Coordination normal.  Skin: Skin is warm and dry. No rash noted. He is not diaphoretic.  Psychiatric: He has a normal mood and affect. His behavior is normal. Judgment and thought content normal.  Nursing note and vitals reviewed.  Results for orders placed or performed in visit on 09/06/15  POCT urinalysis dipstick  Result Value Ref Range   Color, UA yellow yellow   Clarity, UA clear clear   Glucose, UA negative negative   Bilirubin, UA negative negative   Ketones, POC UA small (15) (A) negative   Spec Grav, UA 1.025    Blood, UA negative negative   pH, UA 5.5    Protein Ur, POC negative negative   Urobilinogen, UA 0.2    Nitrite, UA Negative Negative   Leukocytes, UA Negative Negative       Assessment & Plan:   1. Disorientation   2. Glucose intolerance (impaired glucose tolerance)   3. Increased ammonia level   4. Tinnitus of both ears   5. Depression   6. BPH (benign prostatic hyperplasia)   7. Hyperlipidemia   8. Seasonal allergic rhinitis due to pollen   9. Insomnia    -New. -s/p admission; obtain records for detailed review. -repeat ammonia level today; obtain labs. -if liver imaging not performed during admission, obtain CT abdomen or abdominal US. -recommend close follow-up with psychiatry; concern for underlying psychiatric contributing; cannot rule out mania versus psychosis; review of recent medical records should help with ddx.   Orders Placed This Encounter  Procedures  . CBC with Differential/Platelet  . Comprehensive metabolic panel  . Ammonia  . Hemoglobin A1c  . POCT urinalysis dipstick   Meds ordered this encounter  Medications  . traZODone (DESYREL) 50 MG tablet    Sig: Take 50 mg by mouth at bedtime.    No Follow-up on file.    Layana Konkel Elayne Guerin, M.D. [facility] [address] [place], [place]   [id number] [phone] phone [phone] fax

## 2015-09-07 LAB — CBC WITH DIFFERENTIAL/PLATELET
BASOS PCT: 1 %
Basophils Absolute: 53 cells/uL (ref 0–200)
EOS ABS: 53 {cells}/uL (ref 15–500)
Eosinophils Relative: 1 %
HEMATOCRIT: 45.9 % (ref 38.5–50.0)
Hemoglobin: 16 g/dL (ref 13.2–17.1)
LYMPHS PCT: 29 %
Lymphs Abs: 1537 cells/uL (ref 850–3900)
MCH: 31.1 pg (ref 27.0–33.0)
MCHC: 34.9 g/dL (ref 32.0–36.0)
MCV: 89.1 fL (ref 80.0–100.0)
MONOS PCT: 8 %
MPV: 9.5 fL (ref 7.5–12.5)
Monocytes Absolute: 424 cells/uL (ref 200–950)
NEUTROS ABS: 3233 {cells}/uL (ref 1500–7800)
NEUTROS PCT: 61 %
PLATELETS: 242 10*3/uL (ref 140–400)
RBC: 5.15 MIL/uL (ref 4.20–5.80)
RDW: 14.6 % (ref 11.0–15.0)
WBC: 5.3 10*3/uL (ref 3.8–10.8)

## 2015-09-07 LAB — AMMONIA: AMMONIA: 31 umol/L (ref 16–53)

## 2015-09-12 ENCOUNTER — Encounter: Payer: Self-pay | Admitting: Family Medicine

## 2015-09-13 ENCOUNTER — Encounter: Payer: Self-pay | Admitting: *Deleted

## 2015-09-20 ENCOUNTER — Telehealth: Payer: Self-pay

## 2015-09-20 NOTE — Telephone Encounter (Signed)
Patient states he was told to ask about making an appointment with Dr. Tamala Julian since she he used to see Thurmond Butts at the appointment center. Patient had lab work and wants to know how he should continues to take of himself. He wants to know if he needs to follow up or not and if he can have an appointment. I informed patient of the new appointment but he wants to see if it'll be okay to schedule him since he used to see Ryan. Please advise!   [phone]

## 2015-10-03 NOTE — Telephone Encounter (Signed)
Please review

## 2015-10-03 NOTE — Telephone Encounter (Signed)
Dr. Tamala Julian the last time this patient was seen in the appointment center was on 08/20/2013 by Christell Faith, do you want me to make an appointment for him with you?   Please advise, Thank you,  Have a great day!

## 2015-10-07 NOTE — Telephone Encounter (Signed)
Yes, I am fine seeing patient at appointment center yet he may need follow-up sooner than my next available appointment.  If so, please advise patient to present to the Walk in clinic to see me.  Another option is to be placed on the wait list in case a patient cancels an appointment.  Pt does need a follow-up appointment with me or does need to follow-up with me within the upcoming month.

## 2015-12-31 ENCOUNTER — Ambulatory Visit (INDEPENDENT_AMBULATORY_CARE_PROVIDER_SITE_OTHER): Payer: BLUE CROSS/BLUE SHIELD | Admitting: Physician Assistant

## 2015-12-31 VITALS — BP 128/68 | HR 61 | Temp 97.8°F | Resp 16 | Ht 70.0 in | Wt 168.0 lb

## 2015-12-31 DIAGNOSIS — H9313 Tinnitus, bilateral: Secondary | ICD-10-CM | POA: Diagnosis not present

## 2015-12-31 DIAGNOSIS — E785 Hyperlipidemia, unspecified: Secondary | ICD-10-CM | POA: Diagnosis not present

## 2015-12-31 DIAGNOSIS — R7989 Other specified abnormal findings of blood chemistry: Secondary | ICD-10-CM | POA: Diagnosis not present

## 2015-12-31 DIAGNOSIS — G4700 Insomnia, unspecified: Secondary | ICD-10-CM | POA: Diagnosis not present

## 2015-12-31 NOTE — Progress Notes (Signed)
Patient ID: Nathan Hunt, male    DOB: 04/01/1951, 65 y.o.   MRN: [id number]  PCP: Reginia Forts, MD  Subjective:   Chief Complaint  Patient presents with  . Other    pt wants blood work done for [employer] for blood work, though he isn't sure what.  Seen several months ago following hospitalization for elevated ammonia level and dehydration. He was advised to return in 1 month for recheck, but did not. History is somewhat difficult to gather, alternately he says that he doesn't know why he was hospitalized and wanting to know what he can do to prevent it from happening again He has been drinking lots of water, "Saturating myself." Notes that he has lost weight, intentionally.  For the past 11.5 years, he's had disrupted sleep due to tinnitus. 4-5 weeks ago he started sleeping normally. The trazodone is helping, but he has forgotten it on a couple of occasions. He has no idea why it has been beneficial, and has no confidence that it will last. Continues to work on sleep hygiene.  Psychiatrist: Dr. Colin Ina at [place], visits once every 3 months. Relates that "it's supposed to be a 45 minute session, but he can give me all the information in 15 minutes. It's great."  When asked whether he also sees a counselor, he replies, "You know where I get my counseling, [facility]. They have a little area for counseling, off to the side. They are great. They really explain things to me." He denies seeing a psychotherapist, social worker, etc.  In reviewing his chart to discern what labs were needed, the subject of the confusion/disorientation and hallucinations noted in the discharge notes was addressed. "I do not have hallucinations. I don't know why that nurse at the hospital said I had hallucinations." He reports that his behavior was indeed odd, that he was disoriented, and saying things like, "I'm the strongest man in the world! Come on, get my blood!" but that he  remembers that, and he did not see or hear things that were not there. He conceded that perhaps he was behaving in such a way that the observers perceived that he was hallucinating though he did not, or that he was confused and disoriented and therefore doesn't remember.   Review of Systems  Constitutional: Negative for chills and fever.  Respiratory: Negative for cough and shortness of breath.   Cardiovascular: Negative for chest pain, palpitations and leg swelling.  Gastrointestinal: Negative for diarrhea, nausea and vomiting.  Endocrine: Negative for polydipsia.  Genitourinary: Negative for dysuria, frequency and urgency.  Musculoskeletal: Negative for myalgias.  Skin: Negative for rash.  Neurological: Negative for dizziness and headaches.  Psychiatric/Behavioral: Negative for agitation, confusion, decreased concentration, dysphoric mood, hallucinations, self-injury, sleep disturbance and suicidal ideas. The patient is not nervous/anxious.        Patient Active Problem List   Diagnosis Date Noted  . Insomnia 09/06/2015  . Allergic rhinitis due to pollen 03/25/2015  . Hyperlipidemia 03/25/2015  . Tinnitus of both ears 09/19/2012  . Depression 09/19/2012  . BPH (benign prostatic hyperplasia) 09/19/2012     Prior to Admission medications   Medication Sig Start Date End Date Taking? Authorizing Provider  escitalopram (LEXAPRO) 10 MG tablet Take 10 mg by mouth daily.   Yes Historical Provider, MD  traZODone (DESYREL) 50 MG tablet Take 50 mg by mouth at bedtime.   Yes Historical Provider, MD     No Known Allergies  Objective:  Physical Exam  Constitutional: He is oriented to person, place, and time. He appears well-developed and well-nourished. He is active and cooperative. No distress.  BP 128/68 (BP Location: Right Arm, Patient Position: Sitting, Cuff Size: Normal)   Pulse 61   Temp 97.8 F (36.6 C)   Resp 16   Ht 5\' 10"  (1.778 m)   Wt 168 lb (76.2 kg)   SpO2 97%    BMI 24.11 kg/m   HENT:  Head: Normocephalic and atraumatic.  Right Ear: Hearing normal.  Left Ear: Hearing normal.  Eyes: Conjunctivae are normal. No scleral icterus.  Neck: Normal range of motion. Neck supple. No thyromegaly present.  Cardiovascular: Normal rate, regular rhythm and normal heart sounds.   Pulses:      Radial pulses are 2+ on the right side, and 2+ on the left side.  Pulmonary/Chest: Effort normal and breath sounds normal.  Lymphadenopathy:       Head (right side): No tonsillar, no preauricular, no posterior auricular and no occipital adenopathy present.       Head (left side): No tonsillar, no preauricular, no posterior auricular and no occipital adenopathy present.    He has no cervical adenopathy.       Right: No supraclavicular adenopathy present.       Left: No supraclavicular adenopathy present.  Neurological: He is alert and oriented to person, place, and time. No sensory deficit.  Skin: Skin is warm, dry and intact. No rash noted. No cyanosis or erythema. Nails show no clubbing.  Psychiatric: He has a normal mood and affect. His speech is normal. He is hyperactive (repeatedly stands and walks about the room during interview and exam). He is not agitated, not aggressive, not slowed, not withdrawn, not actively hallucinating and not combative. Thought content is not paranoid. Impulsive: possibly. He expresses no homicidal and no suicidal ideation. He expresses no suicidal plans and no homicidal plans. Abnormal recent memory: possibly. He is inattentive.     Wt Readings from Last 3 Encounters:  12/31/15 168 lb (76.2 kg)  09/06/15 173 lb (78.5 kg)  02/20/15 182 lb (82.6 kg)       Assessment & Plan:   1. Increased ammonia level This had returned to normal in April. He's had no recurrence of the confusion/disorientation that I can tell, so likely remains normal.  - Comprehensive metabolic panel - AMMONIA  2. Hyperlipidemia Not really fasting today, but may  need to resume stating therapy. - Lipid panel  3. Insomnia Presently resolved. May recur. Continue Trazodone and sleep hygiene.  4. Tinnitus of both ears Chronic and unchanged.   Fara Chute, PA-C Physician Assistant-Certified [facility]

## 2015-12-31 NOTE — Patient Instructions (Signed)
     IF you received an x-ray today, you will receive an invoice from [facility]. Please contact [facility] at [phone] with questions or concerns regarding your invoice.   IF you received labwork today, you will receive an invoice from [facility]. Please contact [facility] at [phone] with questions or concerns regarding your invoice.   Our billing staff will not be able to assist you with questions regarding bills from these companies.  You will be contacted with the lab results as soon as they are available. The fastest way to get your results is to activate your My Chart account. Instructions are located on the last page of this paperwork. If you have not heard from us regarding the results in 2 weeks, please contact this office.      

## 2016-01-01 LAB — COMPREHENSIVE METABOLIC PANEL
ALBUMIN: 4.1 g/dL (ref 3.6–5.1)
ALT: 22 U/L (ref 9–46)
AST: 30 U/L (ref 10–35)
Alkaline Phosphatase: 77 U/L (ref 40–115)
BILIRUBIN TOTAL: 0.6 mg/dL (ref 0.2–1.2)
BUN: 24 mg/dL (ref 7–25)
CALCIUM: 9.4 mg/dL (ref 8.6–10.3)
CHLORIDE: 107 mmol/L (ref 98–110)
CO2: 25 mmol/L (ref 20–31)
CREATININE: 1.16 mg/dL (ref 0.70–1.25)
Glucose, Bld: 92 mg/dL (ref 65–99)
Potassium: 5 mmol/L (ref 3.5–5.3)
Sodium: 140 mmol/L (ref 135–146)
TOTAL PROTEIN: 6.7 g/dL (ref 6.1–8.1)

## 2016-01-01 LAB — LIPID PANEL
CHOLESTEROL: 217 mg/dL — AB (ref 125–200)
HDL: 41 mg/dL (ref >=40)
LDL Cholesterol: 131 mg/dL — ABNORMAL HIGH (ref <130)
TRIGLYCERIDES: 224 mg/dL — AB (ref <150)
Total CHOL/HDL Ratio: 5.3 Ratio — ABNORMAL HIGH (ref <=5.0)
VLDL: 45 mg/dL — AB (ref <30)

## 2016-01-01 LAB — AMMONIA: Ammonia: 47 umol/L (ref 16–53)

## 2016-01-02 ENCOUNTER — Encounter: Payer: Self-pay | Admitting: Physician Assistant

## 2016-01-07 NOTE — Telephone Encounter (Signed)
error 

## 2016-07-23 ENCOUNTER — Telehealth: Payer: Self-pay

## 2016-07-23 NOTE — Telephone Encounter (Signed)
Pt stopped in to ask the  Word diagnosis for his elevated amonia levels.  He wants to put it on an ID bracelet so ems knows.  Is there a dx to put on it? I could not find one.  We should call and l/m and spelling with any response

## 2016-07-23 NOTE — Telephone Encounter (Signed)
Call --- you can have pt put on emergency bracelet, "History of hepatic encephalopathy; history of elevated ammonia level."  Has he had any other episodes of confusion and disorientation?

## 2016-07-24 NOTE — Telephone Encounter (Signed)
Pt advised.

## 2016-07-27 ENCOUNTER — Ambulatory Visit: Payer: BLUE CROSS/BLUE SHIELD | Admitting: Family Medicine

## 2016-07-30 ENCOUNTER — Telehealth: Payer: Self-pay | Admitting: Family Medicine

## 2016-07-30 NOTE — Telephone Encounter (Signed)
Dr Tamala Julian  Patient is requesting a lab order for tomorrow.   [phone] (H)

## 2016-07-30 NOTE — Telephone Encounter (Signed)
Unable to leave message for patient. Not sure what lab orders he needs. I do not see that he has a visit scheduled tomorrow.  Last visit here was with me 12/31/2015. Last visit with you was 09/06/2015.

## 2016-07-30 NOTE — Telephone Encounter (Signed)
His cards he asked [place] to fill out are in file up front for pick up too

## 2016-08-05 NOTE — Telephone Encounter (Signed)
Pt came by and picked up his cards.  He says that he is supposed to have ammonia levels checked in his blood ever so often and that it is time for that.  There is no order for this and wants to see if he can get it done.  He has moved and has little service at his home for now, so if you can't get him, please keep trying.  He will try to call you back if he doesn't hear from [place]. [phone]

## 2016-08-06 NOTE — Telephone Encounter (Signed)
Please place order if appropriate.

## 2016-08-07 NOTE — Telephone Encounter (Signed)
Call -- patient needs office visit with me.  I have not personally seen him in 11 months; please schedule office visit with me; we will complete blood work at his office visit.

## 2017-10-26 ENCOUNTER — Encounter: Payer: Self-pay | Admitting: Family Medicine

## 2019-07-16 ENCOUNTER — Ambulatory Visit: Payer: Medicare Other

## 2019-09-09 DIAGNOSIS — F209 Schizophrenia, unspecified: Secondary | ICD-10-CM | POA: Insufficient documentation

## 2019-09-10 DIAGNOSIS — E785 Hyperlipidemia, unspecified: Secondary | ICD-10-CM | POA: Insufficient documentation

## 2019-09-10 DIAGNOSIS — J302 Other seasonal allergic rhinitis: Secondary | ICD-10-CM | POA: Insufficient documentation

## 2019-09-10 DIAGNOSIS — F3113 Bipolar disorder, current episode manic without psychotic features, severe: Secondary | ICD-10-CM | POA: Insufficient documentation

## 2019-09-12 DIAGNOSIS — K5900 Constipation, unspecified: Secondary | ICD-10-CM | POA: Insufficient documentation

## 2019-09-26 ENCOUNTER — Ambulatory Visit (INDEPENDENT_AMBULATORY_CARE_PROVIDER_SITE_OTHER): Payer: Medicare Other | Admitting: Registered Nurse

## 2019-09-26 ENCOUNTER — Other Ambulatory Visit: Payer: Self-pay

## 2019-09-26 ENCOUNTER — Encounter: Payer: Self-pay | Admitting: Registered Nurse

## 2019-09-26 VITALS — BP 112/71 | HR 82 | Temp 97.4°F | Resp 16 | Ht 70.0 in | Wt 175.2 lb

## 2019-09-26 DIAGNOSIS — K7682 Hepatic encephalopathy: Secondary | ICD-10-CM

## 2019-09-26 DIAGNOSIS — Z13228 Encounter for screening for other metabolic disorders: Secondary | ICD-10-CM

## 2019-09-26 DIAGNOSIS — Z1329 Encounter for screening for other suspected endocrine disorder: Secondary | ICD-10-CM | POA: Diagnosis not present

## 2019-09-26 DIAGNOSIS — Z8639 Personal history of other endocrine, nutritional and metabolic disease: Secondary | ICD-10-CM

## 2019-09-26 DIAGNOSIS — Z125 Encounter for screening for malignant neoplasm of prostate: Secondary | ICD-10-CM

## 2019-09-26 DIAGNOSIS — N400 Enlarged prostate without lower urinary tract symptoms: Secondary | ICD-10-CM

## 2019-09-26 DIAGNOSIS — Z1159 Encounter for screening for other viral diseases: Secondary | ICD-10-CM

## 2019-09-26 DIAGNOSIS — Z13220 Encounter for screening for lipoid disorders: Secondary | ICD-10-CM

## 2019-09-26 DIAGNOSIS — R972 Elevated prostate specific antigen [PSA]: Secondary | ICD-10-CM

## 2019-09-26 DIAGNOSIS — K7290 Hepatic failure, unspecified without coma: Secondary | ICD-10-CM

## 2019-09-26 DIAGNOSIS — Z130 Encounter for screening for diseases of the blood and blood-forming organs and certain disorders involving the immune mechanism: Secondary | ICD-10-CM

## 2019-09-26 NOTE — Patient Instructions (Signed)
° ° ° °  If you have lab work done today you will be contacted with your lab results within the next 2 weeks.  If you have not heard from us then please contact us. The fastest way to get your results is to register for My Chart. ° ° °IF you received an x-ray today, you will receive an invoice from [facility]. Please contact [facility] at [phone] with questions or concerns regarding your invoice.  ° °IF you received labwork today, you will receive an invoice from [facility]. Please contact [facility] at [phone] with questions or concerns regarding your invoice.  ° °Our billing staff will not be able to assist you with questions regarding bills from these companies. ° °You will be contacted with the lab results as soon as they are available. The fastest way to get your results is to activate your My Chart account. Instructions are located on the last page of this paperwork. If you have not heard from us regarding the results in 2 weeks, please contact this office. °  ° ° ° °

## 2019-09-27 ENCOUNTER — Encounter: Payer: Self-pay | Admitting: Registered Nurse

## 2019-09-27 LAB — COMPREHENSIVE METABOLIC PANEL
ALT: 29 IU/L (ref 0–44)
AST: 27 IU/L (ref 0–40)
Albumin/Globulin Ratio: 1.6 (ref 1.2–2.2)
Albumin: 4.1 g/dL (ref 3.8–4.8)
Alkaline Phosphatase: 102 IU/L (ref 39–117)
BUN/Creatinine Ratio: 15 (ref 10–24)
BUN: 16 mg/dL (ref 8–27)
Bilirubin Total: 0.3 mg/dL (ref 0.0–1.2)
CO2: 23 mmol/L (ref 20–29)
Calcium: 9.3 mg/dL (ref 8.6–10.2)
Chloride: 107 mmol/L — ABNORMAL HIGH (ref 96–106)
Creatinine, Ser: 1.06 mg/dL (ref 0.76–1.27)
GFR calc Af Amer: 82 mL/min/{1.73_m2} (ref >59)
GFR calc non Af Amer: 71 mL/min/{1.73_m2} (ref >59)
Globulin, Total: 2.6 g/dL (ref 1.5–4.5)
Glucose: 82 mg/dL (ref 65–99)
Potassium: 4.2 mmol/L (ref 3.5–5.2)
Sodium: 143 mmol/L (ref 134–144)
Total Protein: 6.7 g/dL (ref 6.0–8.5)

## 2019-09-27 LAB — CBC WITH DIFFERENTIAL/PLATELET
Basophils Absolute: 0.1 10*3/uL (ref 0.0–0.2)
Basos: 1 %
EOS (ABSOLUTE): 0.3 10*3/uL (ref 0.0–0.4)
Eos: 4 %
Hematocrit: 42.9 % (ref 37.5–51.0)
Hemoglobin: 14.5 g/dL (ref 13.0–17.7)
Immature Grans (Abs): 0 10*3/uL (ref 0.0–0.1)
Immature Granulocytes: 1 %
Lymphocytes Absolute: 1.5 10*3/uL (ref 0.7–3.1)
Lymphs: 23 %
MCH: 30.9 pg (ref 26.6–33.0)
MCHC: 33.8 g/dL (ref 31.5–35.7)
MCV: 91 fL (ref 79–97)
Monocytes Absolute: 0.7 10*3/uL (ref 0.1–0.9)
Monocytes: 10 %
Neutrophils Absolute: 4 10*3/uL (ref 1.4–7.0)
Neutrophils: 61 %
Platelets: 244 10*3/uL (ref 150–450)
RBC: 4.7 x10E6/uL (ref 4.14–5.80)
RDW: 13.2 % (ref 11.6–15.4)
WBC: 6.6 10*3/uL (ref 3.4–10.8)

## 2019-09-27 LAB — LIPID PANEL
Chol/HDL Ratio: 7.8 ratio — ABNORMAL HIGH (ref 0.0–5.0)
Cholesterol, Total: 218 mg/dL — ABNORMAL HIGH (ref 100–199)
HDL: 28 mg/dL — ABNORMAL LOW (ref >39)
LDL Chol Calc (NIH): 123 mg/dL — ABNORMAL HIGH (ref 0–99)
Triglycerides: 377 mg/dL — ABNORMAL HIGH (ref 0–149)
VLDL Cholesterol Cal: 67 mg/dL — ABNORMAL HIGH (ref 5–40)

## 2019-09-27 LAB — TSH: TSH: 2.73 u[IU]/mL (ref 0.450–4.500)

## 2019-09-27 LAB — AMMONIA: Ammonia: 42 ug/dL (ref 40–200)

## 2019-09-27 LAB — HEMOGLOBIN A1C
Est. average glucose Bld gHb Est-mCnc: 103 mg/dL
Hgb A1c MFr Bld: 5.2 % (ref 4.8–5.6)

## 2019-09-27 LAB — PSA: Prostate Specific Ag, Serum: 5.2 ng/mL — ABNORMAL HIGH (ref 0.0–4.0)

## 2019-09-27 LAB — HEPATITIS C ANTIBODY: Hep C Virus Ab: <0.1 s/co ratio (ref 0.0–0.9)

## 2019-09-27 NOTE — Progress Notes (Signed)
New Patient Office Visit  Subjective:  Patient ID: Nathan Hunt, male    DOB: 1951-03-25  Age: 69 y.o. MRN: [id number]  CC:  Chief Complaint  Patient presents with  . New Patient (Initial Visit)    establish care patient states he was in the hospital for 8 days for Hepatic Encephalopathy and stated it has almost killed him 5 times not so he needed to follow up.     HPI Nathan Hunt presents for visit to establish care.  Recent 8 day hospitalization for encephalopathy. Noted in his chart is a psychotic episode and treatment, though he denies this. He states that he has no psychiatric history and is in the process of having this expunged from his record - he states any symptoms of psychosis have been a result of hepatic encephalopathy  He is feeling well today. Has not been under the care of a PCP in some time - hoping to get reestablished, get labs, and get a referral to GI - liver specialist team.  No urgent concerns.   Past Medical History:  Diagnosis Date  . BPH (benign prostatic hyperplasia)   . Depression   . Hyperlipidemia   . Hypertension   . Insomnia   . Tendonitis   . Tinnitus     Past Surgical History:  Procedure Laterality Date  . TONSILLECTOMY  1962    Family History  Problem Relation Age of Onset  . Heart disease Mother   . Heart disease Father   . Heart disease Sister   . Bone cancer Maternal Grandfather   . Bone cancer Other   . Alzheimer's disease Other   . Colon cancer Neg Hx     Social History   Socioeconomic History  . Marital status: Single    Spouse name: Not on file  . Number of children: Not on file  . Years of education: Not on file  . Highest education level: Not on file  Occupational History  . Not on file  Tobacco Use  . Smoking status: Never Smoker  . Smokeless tobacco: Never Used  Substance and Sexual Activity  . Alcohol use: Yes    Alcohol/week: 0.0 standard drinks  . Drug use: No  . Sexual activity: Not on file    Other Topics Concern  . Not on file  Social History Narrative  . Not on file   Social Determinants of Health   Financial Resource Strain:   . Difficulty of Paying Living Expenses:   Food Insecurity:   . Worried About Charity fundraiser in the Last Year:   . Arboriculturist in the Last Year:   Transportation Needs:   . Film/video editor (Medical):   Marland Kitchen Lack of Transportation (Non-Medical):   Physical Activity:   . Days of Exercise per Week:   . Minutes of Exercise per Session:   Stress:   . Feeling of Stress :   Social Connections:   . Frequency of Communication with Friends and Family:   . Frequency of Social Gatherings with Friends and Family:   . Attends Religious Services:   . Active Member of Clubs or Organizations:   . Attends Archivist Meetings:   Marland Kitchen Marital Status:   Intimate Partner Violence:   . Fear of Current or Ex-Partner:   . Emotionally Abused:   Marland Kitchen Physically Abused:   . Sexually Abused:     ROS Review of Systems  Constitutional: Negative.   HENT: Negative.  Eyes: Negative.   Respiratory: Negative.   Cardiovascular: Negative.   Gastrointestinal: Negative.   Endocrine: Negative.   Genitourinary: Negative.   Musculoskeletal: Negative.   Skin: Negative.   Allergic/Immunologic: Negative.   Neurological: Negative.   Hematological: Negative.   Psychiatric/Behavioral: Negative.   All other systems reviewed and are negative.   Objective:   Today's Vitals: BP 112/71   Pulse 82   Temp (!) 97.4 F (36.3 C) (Temporal)   Resp 16   Ht 5\' 10"  (1.778 m)   Wt 175 lb 3.2 oz (79.5 kg)   SpO2 97%   BMI 25.14 kg/m   Physical Exam Vitals and nursing note reviewed.  Constitutional:      Appearance: Normal appearance. He is normal weight.  HENT:     Head: Normocephalic.  Cardiovascular:     Rate and Rhythm: Normal rate and regular rhythm.  Pulmonary:     Effort: Pulmonary effort is normal. No respiratory distress.  Abdominal:      Comments: Deferred  Skin:    Capillary Refill: Capillary refill takes less than 2 seconds.  Neurological:     General: No focal deficit present.     Mental Status: He is alert. Mental status is at baseline.  Psychiatric:        Mood and Affect: Mood normal.        Behavior: Behavior normal.        Thought Content: Thought content normal.        Judgment: Judgment normal.     Assessment & Plan:   Problem List Items Addressed This Visit      Genitourinary   BPH (benign prostatic hyperplasia)   Relevant Orders   PSA (Completed)    Other Visit Diagnoses    Screening for viral disease    -  Primary   Relevant Orders   Hepatitis C antibody (Completed)   Screening PSA (prostate specific antigen)       Relevant Orders   PSA (Completed)   Screening for endocrine, metabolic and immunity disorder       Relevant Orders   TSH (Completed)   History of elevated glucose       Relevant Orders   Hemoglobin A1c (Completed)   Hepatic encephalopathy ([place])       Relevant Orders   CBC with Differential (Completed)   Comprehensive metabolic panel (Completed)   Ammonia (Completed)   Lipid screening       Relevant Orders   Lipid panel (Completed)      Outpatient Encounter Medications as of 09/26/2019  Medication Sig  . atorvastatin (LIPITOR) 20 MG tablet Take 1 tablet (20 mg total) by mouth daily. NO MORE REFILLS WITHOUT OFFICE VISIT - 2ND NOTICE  . cetirizine (ZYRTEC) 5 MG tablet Take 5 mg by mouth daily.  Marland Kitchen escitalopram (LEXAPRO) 10 MG tablet Take 10 mg by mouth daily.  . traZODone (DESYREL) 50 MG tablet Take 50 mg by mouth at bedtime.  . zoster vaccine live, PF, (ZOSTAVAX) 16109 UNT/0.65ML injection Inject 19,400 Units into the skin once. (Patient not taking: Reported on 12/31/2015)   No facility-administered encounter medications on file as of 09/26/2019.    Follow-up: No follow-ups on file.   PLAN  Refer to GI - hepatology  Drawn labs - will follow up as warranted  Suggest  CPE  Patient encouraged to call clinic with any questions, comments, or concerns.  Maximiano Coss, NP

## 2019-09-29 ENCOUNTER — Encounter: Payer: Self-pay | Admitting: Gastroenterology

## 2019-10-31 ENCOUNTER — Ambulatory Visit (INDEPENDENT_AMBULATORY_CARE_PROVIDER_SITE_OTHER): Payer: Medicare Other | Admitting: Gastroenterology

## 2019-10-31 ENCOUNTER — Encounter: Payer: Self-pay | Admitting: Gastroenterology

## 2019-10-31 VITALS — BP 112/82 | HR 77 | Ht 70.0 in | Wt 171.6 lb

## 2019-10-31 DIAGNOSIS — Z8679 Personal history of other diseases of the circulatory system: Secondary | ICD-10-CM | POA: Diagnosis not present

## 2019-10-31 DIAGNOSIS — F05 Delirium due to known physiological condition: Secondary | ICD-10-CM | POA: Diagnosis not present

## 2019-10-31 DIAGNOSIS — Z8719 Personal history of other diseases of the digestive system: Secondary | ICD-10-CM

## 2019-10-31 NOTE — Progress Notes (Signed)
Referring Provider: Maximiano Coss, NP Primary Care Physician:  Maximiano Coss, NP  Reason for Consultation:  Hepatic encephalopathy   IMPRESSION:  Acute episodes of confusion requiring hospitalization ? Hepatic encephalopathy  Although he had one isolated elevated ammonia level in 2017, they have been normal since that time without any evidence for portal hypertension and/or cirrhosis by labs, physical exam, or history. Cirrhosis and/or underlying portal hypertension is necessary for hepatic encephalopathy to develop.   Labs from April 2021 show normal platelets, normal liver enzymes, and normal albumin. FIB-4 is 1.42 (negative predictive value of 90% for advanced fibrosis) and APRI is 0.277 (<0.5 has a high negative predictive value).    Will plan a doppler ultrasound to evaluate the hepatic parenchyma and to evaluate for additional evidence for portal hypertension. No further evaluation recommended if that study is negative.    Patient remains fixated on a diagnosis of hepatic encephalopathy. He wishes to obtain a second opinion at a tertiary care center if his ultrasound is negative, although he was hoping this could be performed within the [facility] system. Marland Kitchen      PLAN: Doppler ultrasound Referral to [facility] if doppler ultrasound is negative Consider screening colonoscopy when he is interested in pursuing preventative care  I spent 45 minutes, including in depth chart review, face-to-face time with the patient, coordinating care, ordering studies as appropriate, and documentation.   HPI: Nathan Hunt is a 69 y.o. male referred by NP Orland Mustard for evaluation of possible hepatic encephalopathy. The history is obtained through the patient and review of his electronic health record. Previously under the care of Dr. Tamala Julian at [facility], recently established care with NP Orland Mustard.  Recently hospitalized for 8 days in [place]. He was treated psychologically but  feels that he should be treated medically for hepatic encephalopathy. Feels that he has been misdiagnosed and mistreated.   He lives in [place] but he has a second home in [place]. He feels that he has been hospitalized 4 times and nearly died. Initially hospitalized in [place], [place]. Last July he was treated at [facility]. Two months ago he was taken to the [facility] again and was ultimately transferred to [facility]. Per the patient's report, he was treated as if he was bipolar or schizophrenic. He does not feel that he was appropriately treated. He feels that his problems was actually hepatic encephalopathy. He will self-treat his ammonia levels with water hydration on hospitalization and feels this ultimately leads to his clinical improvement.  Records from his last hospitalization mention significant depression, psychosis, and insomnia with a presentation of agitation, confusion, and pananoia. During his hospitalization he was comanaged with the hospitalist group who found no evidence for hepatic encephalopathy.    No prior blood donation.  No prior blood transfusion.  No history of jaundice or scleral icterus.  No history of varices, ascites, HPS, HRS. No history of use or experimentation with IV or intranasal street drugs.  No history of autoimmune disease.  No family history of liver disease.  Labs 09/26/19 show normal hepatic panel, and normal CBC including normal platelets at 244.  Records in EPIC show normal ammonia levels 4 years ago, 3 years ago, and 1 year ago. CareEverywhere shows one elevated ammonia level in 2017 at 105 that normalized to 34 within 24 hours.   I am unable to locate any prior abdominal imaging. The patient is not aware of any imaging. No prior liver biopsy.  Past Medical History:  Diagnosis Date  . BPH (benign prostatic hyperplasia)   . Depression   . Hyperlipidemia   . Hypertension   . Insomnia   . Tendonitis   . Tinnitus      Past Surgical History:  Procedure Laterality Date  . TONSILLECTOMY  1962    Current Outpatient Medications  Medication Sig Dispense Refill  . cetirizine (ZYRTEC) 5 MG tablet Take 5 mg by mouth daily.    Marland Kitchen atorvastatin (LIPITOR) 20 MG tablet Take 1 tablet (20 mg total) by mouth daily. NO MORE REFILLS WITHOUT OFFICE VISIT - 2ND NOTICE (Patient not taking: Reported on 10/31/2019) 90 tablet 3  . escitalopram (LEXAPRO) 10 MG tablet Take 10 mg by mouth daily.     No current facility-administered medications for this visit.    Allergies as of 10/31/2019  . (No Known Allergies)    Family History  Problem Relation Age of Onset  . Heart disease Mother   . Heart disease Father   . Heart disease Sister   . Bone cancer Maternal Grandfather   . Bone cancer Other   . Alzheimer's disease Other   . Colon cancer Neg Hx     Social History   Socioeconomic History  . Marital status: Single    Spouse name: Not on file  . Number of children: Not on file  . Years of education: Not on file  . Highest education level: Not on file  Occupational History  . Not on file  Tobacco Use  . Smoking status: Former Smoker    Types: Cigarettes  . Smokeless tobacco: Never Used  Substance and Sexual Activity  . Alcohol use: Yes    Alcohol/week: 0.0 standard drinks  . Drug use: No  . Sexual activity: Not on file  Other Topics Concern  . Not on file  Social History Narrative  . Not on file   Social Determinants of Health   Financial Resource Strain:   . Difficulty of Paying Living Expenses:   Food Insecurity:   . Worried About Charity fundraiser in the Last Year:   . Arboriculturist in the Last Year:   Transportation Needs:   . Film/video editor (Medical):   Marland Kitchen Lack of Transportation (Non-Medical):   Physical Activity:   . Days of Exercise per Week:   . Minutes of Exercise per Session:   Stress:   . Feeling of Stress :   Social Connections:   . Frequency of Communication with  Friends and Family:   . Frequency of Social Gatherings with Friends and Family:   . Attends Religious Services:   . Active Member of Clubs or Organizations:   . Attends Archivist Meetings:   Marland Kitchen Marital Status:   Intimate Partner Violence:   . Fear of Current or Ex-Partner:   . Emotionally Abused:   Marland Kitchen Physically Abused:   . Sexually Abused:     Review of Systems: 12 system ROS is negative except as noted above.   Physical Exam: General:   Alert,  well-nourished, pleasant and cooperative in NAD Head:  Normocephalic and atraumatic. Eyes:  Sclera clear, no icterus.   Conjunctiva pink. Ears:  Normal auditory acuity. Nose:  No deformity, discharge,  or lesions. Mouth:  No deformity or lesions.   Neck:  Supple; no masses or thyromegaly. Lungs:  Clear throughout to auscultation.   No wheezes. Heart:  Regular rate and rhythm; no murmurs. Abdomen:  Soft,nontender, nondistended, normal bowel  sounds, no rebound or guarding. No hepatosplenomegaly.   Rectal:  Deferred  Msk:  Symmetrical. No boney deformities LAD: No inguinal or umbilical LAD Extremities:  No clubbing or edema. Neurologic:  Alert and  oriented x4;  grossly nonfocal Skin:  Intact without significant lesions or rashes. Psych:  Alert and cooperative. Normal mood and affect.    Nathan Delafuente L. Tarri Glenn, MD, MPH 10/31/2019, 3:24 PM

## 2019-10-31 NOTE — Patient Instructions (Signed)
You have been scheduled for an liver doppler ultrasound at [facility] (1st floor of hospital) on 11/07/19 at 9am. Please arrive 15 minutes prior to your appointment for registration. Make certain not to have anything to eat or drink 6 hours prior to your appointment. Should you need to reschedule your appointment, please contact radiology at [phone]. This test typically takes about 30 minutes to perform.  Thank you for trusting me with your gastrointestinal care!    Thornton Park, MD, MPH

## 2019-11-07 ENCOUNTER — Ambulatory Visit (HOSPITAL_COMMUNITY): Payer: Medicare Other

## 2019-11-10 ENCOUNTER — Encounter: Payer: Self-pay | Admitting: Registered Nurse

## 2019-11-10 ENCOUNTER — Ambulatory Visit (INDEPENDENT_AMBULATORY_CARE_PROVIDER_SITE_OTHER): Payer: Medicare Other | Admitting: Registered Nurse

## 2019-11-10 ENCOUNTER — Other Ambulatory Visit: Payer: Self-pay

## 2019-11-10 VITALS — BP 130/85 | HR 66 | Temp 98.2°F | Resp 18 | Ht 70.0 in | Wt 170.0 lb

## 2019-11-10 DIAGNOSIS — G479 Sleep disorder, unspecified: Secondary | ICD-10-CM | POA: Diagnosis not present

## 2019-11-10 MED ORDER — TRAZODONE HCL 50 MG PO TABS: 25.0000 mg | ORAL_TABLET | Freq: Every evening | ORAL | 0 refills | Status: DC | PRN

## 2019-11-10 NOTE — Patient Instructions (Signed)
° ° ° °  If you have lab work done today you will be contacted with your lab results within the next 2 weeks.  If you have not heard from us then please contact us. The fastest way to get your results is to register for My Chart. ° ° °IF you received an x-ray today, you will receive an invoice from [facility]. Please contact [facility] at [phone] with questions or concerns regarding your invoice.  ° °IF you received labwork today, you will receive an invoice from [facility]. Please contact [facility] at [phone] with questions or concerns regarding your invoice.  ° °Our billing staff will not be able to assist you with questions regarding bills from these companies. ° °You will be contacted with the lab results as soon as they are available. The fastest way to get your results is to activate your My Chart account. Instructions are located on the last page of this paperwork. If you have not heard from us regarding the results in 2 weeks, please contact this office. °  ° ° ° °

## 2019-11-10 NOTE — Progress Notes (Signed)
Established Patient Office Visit  Subjective:  Patient ID: Nathan Hunt, male    DOB: 16-Aug-1950  Age: 69 y.o. MRN: [id number]  CC:  Chief Complaint  Patient presents with  . Follow-up    Patient is here for follow up . states he has a bump back of the neck , top of head , and groin area    HPI Nathan Hunt presents for follow up  Has concerns for his ongoing medical conditions - with elevated PSA, he was sent to urology, who mentioned "cancer", but Nathan Hunt cannot remember in what context. Denies hematuria, reviewed labs with normal kidney function, has hx of BPH but no acute urinary changes. He has a planned follow up with Urology on 12/19/19. We discussed that often times "cancer" is used in association with an elevated PSA - concern for prostate ca, and that close follow up is not indicative of a diagnosis. Without the records from urology available, irresponsible to speculate further.  Otherwise, notes that he had a visit with Dr. Tarri Glenn, gastroenterology, regarding his history of hepatic encephalopathy. He has an upcoming [place] of his liver on 11/15/19 at [facility]. We will make an effort to follow up with him after this, though we reassured him that Dr. Tarri Glenn will also follow up.   Lastly, he is concerned for sleep disturbance. He thinks this is related to increased anxiety and less activity lately, as he is fearful for activity intolerance with his recent history of mental status changes related to increase ammonia / hepatic encephalopathy. He has been seen at [facility] in the past and has plans to reestablish there next week. He had been on trazodone in the past 25-50mg  PO qhs PRN for sleep with good effect. Usually getting 1-2 good hours of sleep each night, but usually not more than 4 hours total.  He continues to state that his placement in a psychiatric ward was improper care - his mental status changes have arisen from hepatic encephalopathy and elevated ammonia. He  will be worked up at [facility] where we can hopefully have more concrete evidence of this - nothing in our visits is suggestive of mental health concerns beyond anxiety and depression, however, the acute episodes when he has been seen in the ED are concerning.  Otherwise feeling well.   Past Medical History:  Diagnosis Date  . BPH (benign prostatic hyperplasia)   . Depression   . Hyperlipidemia   . Hypertension   . Insomnia   . Tendonitis   . Tinnitus     Past Surgical History:  Procedure Laterality Date  . TONSILLECTOMY  1962    Family History  Problem Relation Age of Onset  . Heart disease Mother   . Heart disease Father   . Heart disease Sister   . Bone cancer Maternal Grandfather   . Bone cancer Other   . Alzheimer's disease Other   . Colon cancer Neg Hx     Social History   Socioeconomic History  . Marital status: Single    Spouse name: Not on file  . Number of children: Not on file  . Years of education: Not on file  . Highest education level: Not on file  Occupational History  . Not on file  Tobacco Use  . Smoking status: Former Smoker    Types: Cigarettes  . Smokeless tobacco: Never Used  Vaping Use  . Vaping Use: Never used  Substance and Sexual Activity  . Alcohol use: Yes  Alcohol/week: 0.0 standard drinks  . Drug use: No  . Sexual activity: Not on file  Other Topics Concern  . Not on file  Social History Narrative  . Not on file   Social Determinants of Health   Financial Resource Strain:   . Difficulty of Paying Living Expenses:   Food Insecurity:   . Worried About Charity fundraiser in the Last Year:   . Arboriculturist in the Last Year:   Transportation Needs:   . Film/video editor (Medical):   Marland Kitchen Lack of Transportation (Non-Medical):   Physical Activity:   . Days of Exercise per Week:   . Minutes of Exercise per Session:   Stress:   . Feeling of Stress :   Social Connections:   . Frequency of Communication with Friends and  Family:   . Frequency of Social Gatherings with Friends and Family:   . Attends Religious Services:   . Active Member of Clubs or Organizations:   . Attends Archivist Meetings:   Marland Kitchen Marital Status:   Intimate Partner Violence:   . Fear of Current or Ex-Partner:   . Emotionally Abused:   Marland Kitchen Physically Abused:   . Sexually Abused:     Outpatient Medications Prior to Visit  Medication Sig Dispense Refill  . cetirizine (ZYRTEC) 5 MG tablet Take 5 mg by mouth daily.    Marland Kitchen atorvastatin (LIPITOR) 20 MG tablet Take 1 tablet (20 mg total) by mouth daily. NO MORE REFILLS WITHOUT OFFICE VISIT - 2ND NOTICE (Patient not taking: Reported on 10/31/2019) 90 tablet 3  . escitalopram (LEXAPRO) 10 MG tablet Take 10 mg by mouth daily. (Patient not taking: Reported on 11/10/2019)     No facility-administered medications prior to visit.    No Known Allergies  ROS Review of Systems  Constitutional: Negative.   HENT: Negative.   Eyes: Negative.   Respiratory: Negative.   Cardiovascular: Negative.   Gastrointestinal: Negative.   Endocrine: Negative.   Genitourinary: Negative.   Musculoskeletal: Negative.   Allergic/Immunologic: Negative.   Neurological: Negative.   Hematological: Negative.   Psychiatric/Behavioral: Positive for sleep disturbance. Negative for agitation, behavioral problems, confusion, decreased concentration, dysphoric mood, hallucinations, self-injury and suicidal ideas. The patient is nervous/anxious. The patient is not hyperactive.   All other systems reviewed and are negative.     Objective:    Physical Exam Vitals and nursing note reviewed.  Constitutional:      General: He is not in acute distress.    Appearance: Normal appearance. He is normal weight. He is not ill-appearing, toxic-appearing or diaphoretic.  Cardiovascular:     Rate and Rhythm: Normal rate and regular rhythm.  Pulmonary:     Effort: Pulmonary effort is normal. No respiratory distress.   Skin:    General: Skin is warm and dry.     Capillary Refill: Capillary refill takes less than 2 seconds.     Findings: Lesion (a number of bumps: lipoma / inclusion cyst on head and neck, and a comedone in groin) present.  Neurological:     General: No focal deficit present.     Mental Status: He is alert and oriented to person, place, and time. Mental status is at baseline.     Motor: No weakness.  Psychiatric:        Mood and Affect: Mood normal.        Behavior: Behavior normal.        Thought Content: Thought content normal.  Judgment: Judgment normal.     BP 130/85   Pulse 66   Temp 98.2 F (36.8 C) (Temporal)   Resp 18   Ht 5\' 10"  (1.778 m)   Wt 170 lb (77.1 kg)   SpO2 97%   BMI 24.39 kg/m  Wt Readings from Last 3 Encounters:  11/10/19 170 lb (77.1 kg)  10/31/19 171 lb 9.6 oz (77.8 kg)  09/26/19 175 lb 3.2 oz (79.5 kg)     There are no preventive care reminders to display for this patient.  There are no preventive care reminders to display for this patient.  Lab Results  Component Value Date   TSH 2.730 09/26/2019   Lab Results  Component Value Date   WBC 6.6 09/26/2019   HGB 14.5 09/26/2019   HCT 42.9 09/26/2019   MCV 91 09/26/2019   PLT 244 09/26/2019   Lab Results  Component Value Date   NA 143 09/26/2019   K 4.2 09/26/2019   CO2 23 09/26/2019   GLUCOSE 82 09/26/2019   BUN 16 09/26/2019   CREATININE 1.06 09/26/2019   BILITOT 0.3 09/26/2019   ALKPHOS 102 09/26/2019   AST 27 09/26/2019   ALT 29 09/26/2019   PROT 6.7 09/26/2019   ALBUMIN 4.1 09/26/2019   CALCIUM 9.3 09/26/2019   Lab Results  Component Value Date   CHOL 218 (H) 09/26/2019   Lab Results  Component Value Date   HDL 28 (L) 09/26/2019   Lab Results  Component Value Date   LDLCALC 123 (H) 09/26/2019   Lab Results  Component Value Date   TRIG 377 (H) 09/26/2019   Lab Results  Component Value Date   CHOLHDL 7.8 (H) 09/26/2019   Lab Results  Component Value  Date   HGBA1C 5.2 09/26/2019      Assessment & Plan:   Problem List Items Addressed This Visit    None    Visit Diagnoses    Sleep disturbance    -  Primary   Relevant Medications   traZODone (DESYREL) 50 MG tablet      Meds ordered this encounter  Medications  . traZODone (DESYREL) 50 MG tablet    Sig: Take 0.5-1 tablets (25-50 mg total) by mouth at bedtime as needed for sleep.    Dispense:  90 tablet    Refill:  0    Order Specific Question:   Supervising Provider    Answer:   Carlota Raspberry, JEFFREY R [[id number]]    Follow-up: No follow-ups on file.   PLAN  Return in 2 mo for follow up on medical condition  Trazodone 25-50mg  PO qhs PRN  Follow up as scheduled with Dr. Tarri Glenn, Urology, and [facility].  Patient encouraged to call clinic with any questions, comments, or concerns.  Maximiano Coss, NP

## 2019-11-15 ENCOUNTER — Ambulatory Visit (HOSPITAL_COMMUNITY)
Admission: RE | Admit: 2019-11-15 | Discharge: 2019-11-15 | Disposition: A | Discharge status: Home / Self Care | Payer: Medicare Other | Source: Ambulatory Visit | Attending: Gastroenterology | Admitting: Gastroenterology

## 2019-11-15 ENCOUNTER — Other Ambulatory Visit: Payer: Self-pay

## 2019-11-15 DIAGNOSIS — Z8679 Personal history of other diseases of the circulatory system: Secondary | ICD-10-CM | POA: Insufficient documentation

## 2019-11-15 IMAGING — US US HEPATIC LIVER DOPPLER
2 series · 13 of 25 positions shown · non-contrast
Comparison: None.

CLINICAL DATA: Hepatic encephalopathy

EXAM:
DUPLEX ULTRASOUND OF LIVER
TECHNIQUE: Color and duplex Doppler ultrasound was performed to evaluate the
hepatic in-flow and out-flow vessels.

[Series 1: us hepatic liver doppler · 12 of 78 slices shown (1 of 2)]
[im 1/78]
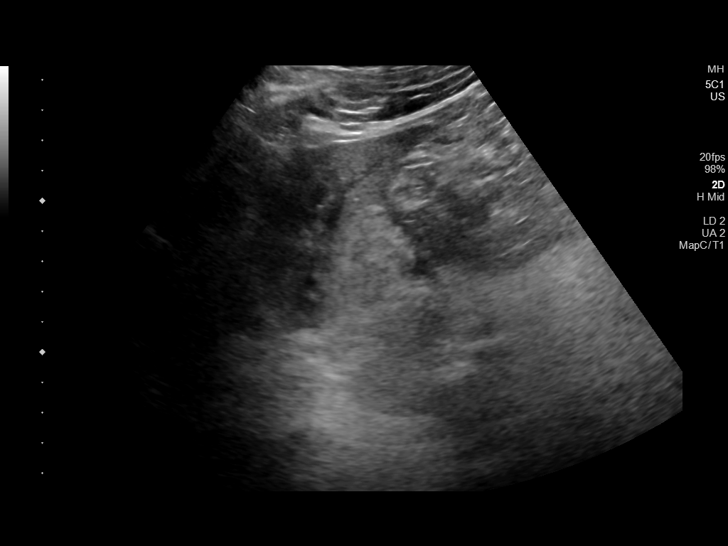
[im 7/78]
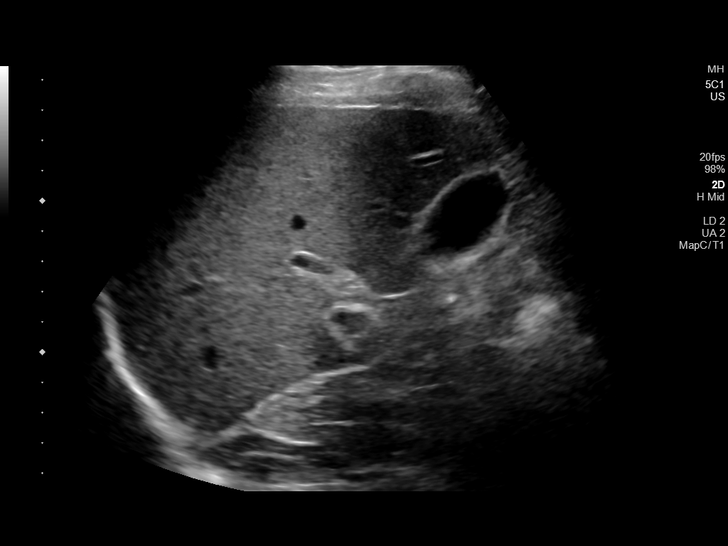
[im 14/78]
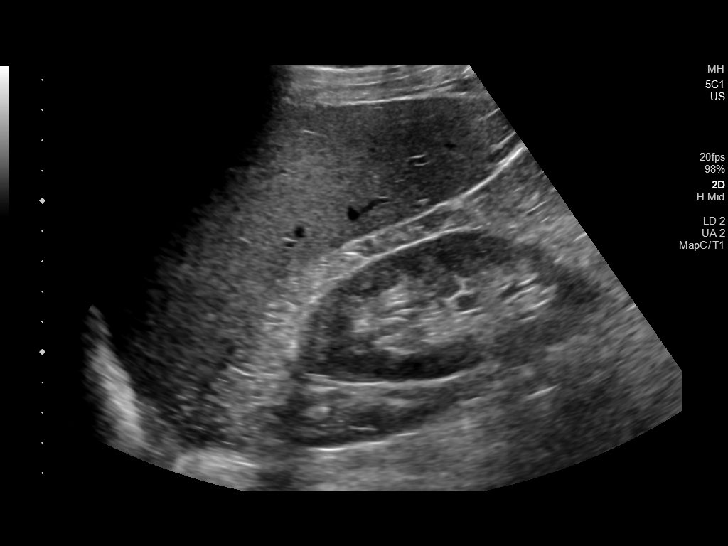
[im 21/78]
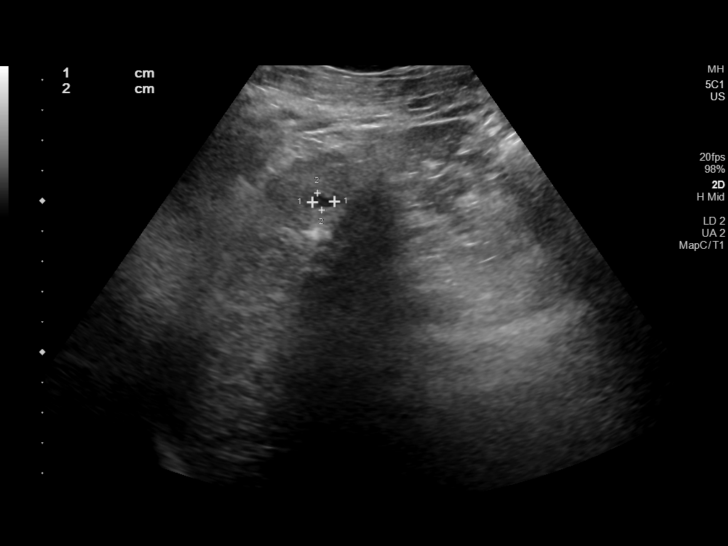
[im 27/78]
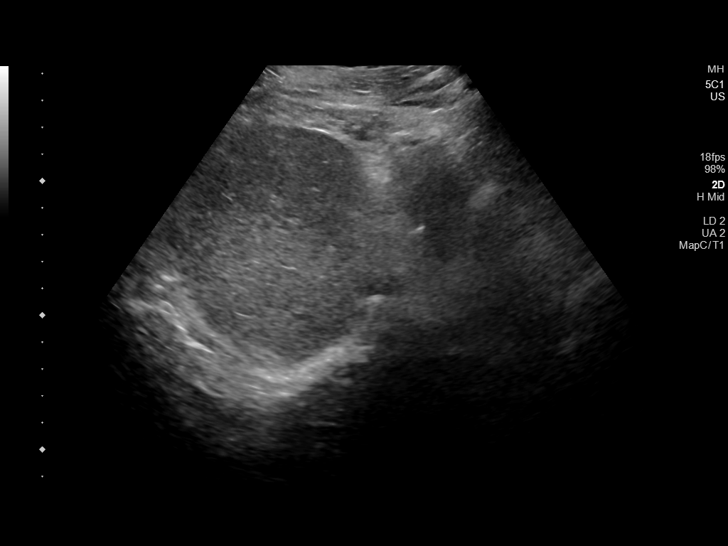
[im 34/78]
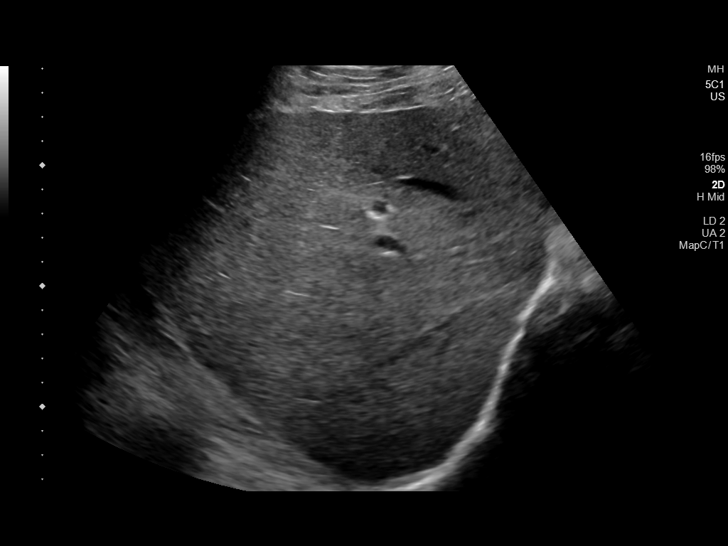
[im 41/78]
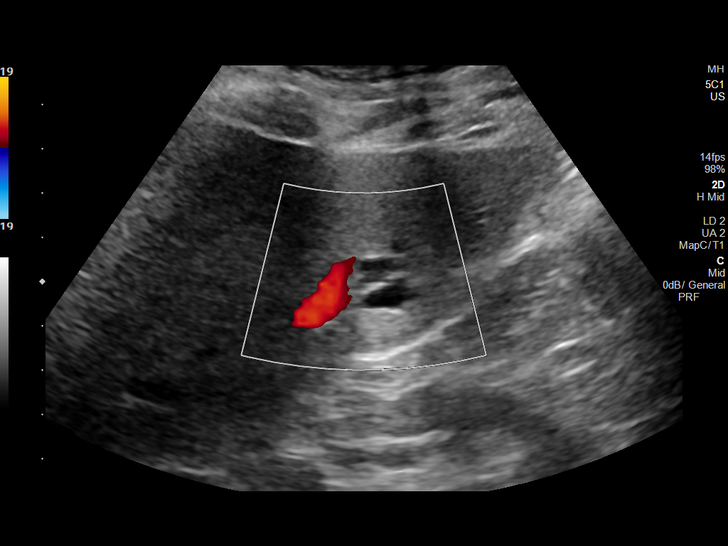
[im 47/78]
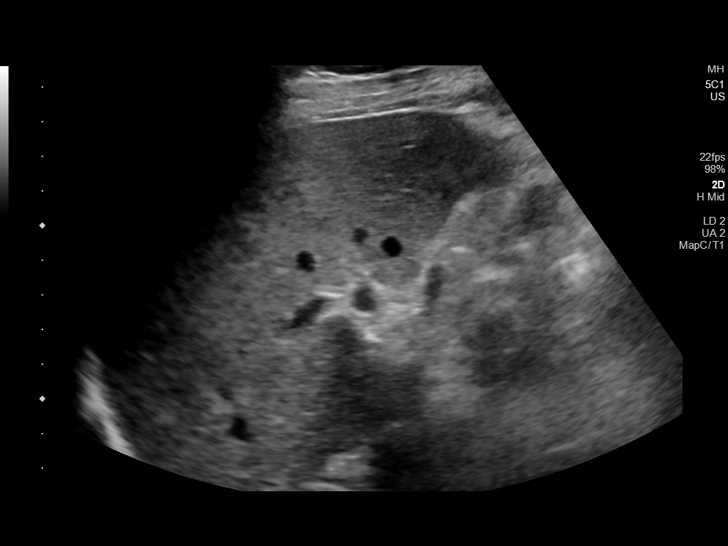
[im 54/78]
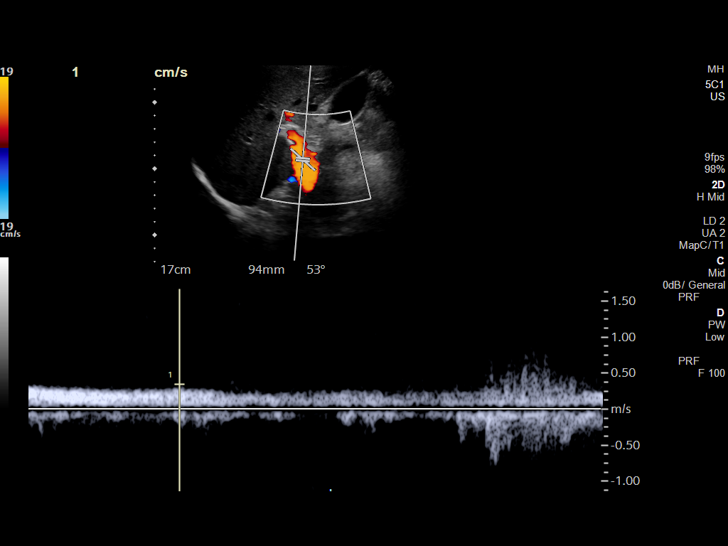
[im 61/78]
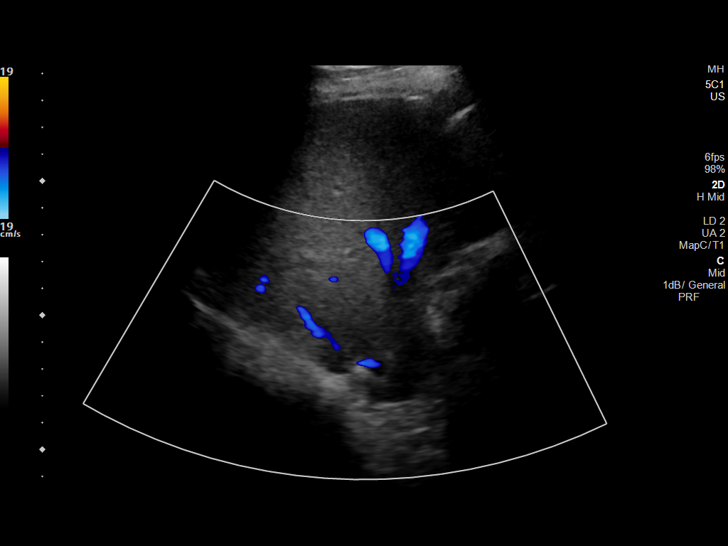
[im 67/78]
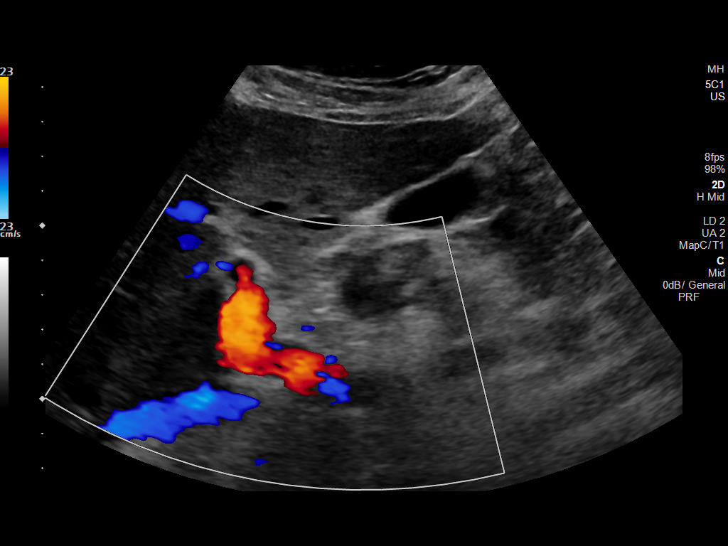
[im 74/78]
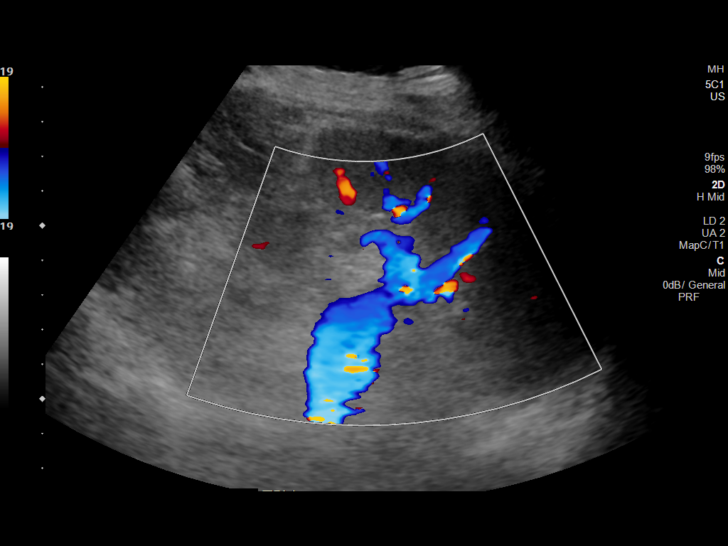

[Series 2: us hepatic liver doppler · 1 of 2 slices shown (2 of 2)]
[im 1/2]
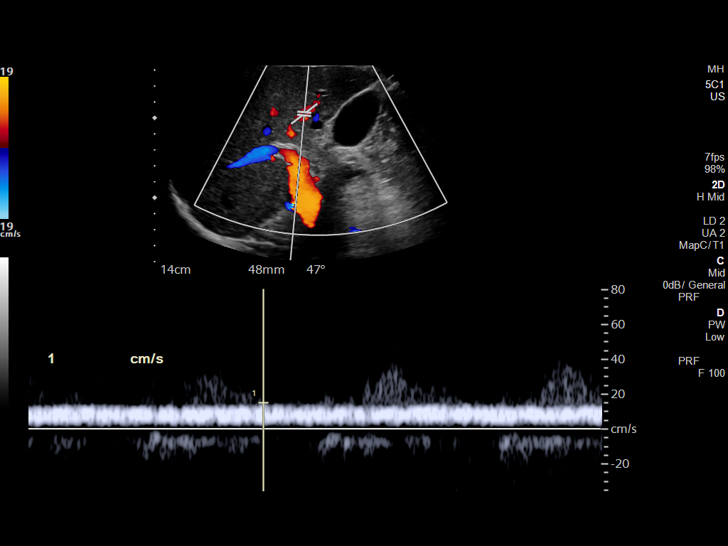

[13 of 25 positions shown; findings below may reference images not displayed]

FINDINGS: Liver: Normal parenchymal echogenicity. Normal hepatic contour
without nodularity.

Several scattered small hepatic cyst noted, largest measures 1.5 cm
in the right lobe inferiorly.

Main Portal Vein size: 0.8 cm cm

Portal Vein Velocities

Main Prox:  25 cm/sec

Main Mid: 25 cm/sec

Main Dist:  34 cm/sec
Right: 30 cm/sec
Left: 15 cm/sec

Hepatic Vein Velocities

Right:  24 cm/sec

Middle:  32 cm/sec

Left:  25 cm/sec

IVC: Present and patent with normal respiratory phasicity.

Hepatic Artery Velocity:  48 cm/sec

Splenic Vein Velocity:  21 cm/sec

Spleen: 8.4 cm x 3.2 cm x 9.8 cm with a total volume of 136 cm^3
(411 cm^3 is upper limit normal)

Portal Vein Occlusion/Thrombus: No

Splenic Vein Occlusion/Thrombus: No

Ascites: None

Varices: None

Patent portal, hepatic and splenic veins with normal directional
flow. Negative for portal vein occlusion or thrombus.
IMPRESSION: Normal hepatic venous Doppler. Negative for portal vein occlusion or
thrombus.

Incidental scattered small hepatic cysts

No free fluid or ascites

## 2019-12-22 ENCOUNTER — Ambulatory Visit: Payer: Medicare Other | Admitting: Registered Nurse

## 2019-12-25 ENCOUNTER — Encounter: Payer: Self-pay | Admitting: Registered Nurse

## 2020-05-07 ENCOUNTER — Ambulatory Visit (INDEPENDENT_AMBULATORY_CARE_PROVIDER_SITE_OTHER): Payer: Medicare Other | Admitting: Registered Nurse

## 2020-05-07 ENCOUNTER — Encounter: Payer: Self-pay | Admitting: Registered Nurse

## 2020-05-07 ENCOUNTER — Other Ambulatory Visit: Payer: Self-pay

## 2020-05-07 VITALS — BP 144/85 | HR 67 | Temp 98.0°F | Resp 18 | Ht 70.0 in | Wt 174.8 lb

## 2020-05-07 DIAGNOSIS — Z125 Encounter for screening for malignant neoplasm of prostate: Secondary | ICD-10-CM | POA: Diagnosis not present

## 2020-05-07 DIAGNOSIS — Z130 Encounter for screening for diseases of the blood and blood-forming organs and certain disorders involving the immune mechanism: Secondary | ICD-10-CM

## 2020-05-07 DIAGNOSIS — Z8639 Personal history of other endocrine, nutritional and metabolic disease: Secondary | ICD-10-CM

## 2020-05-07 DIAGNOSIS — R4189 Other symptoms and signs involving cognitive functions and awareness: Secondary | ICD-10-CM

## 2020-05-07 DIAGNOSIS — Z13220 Encounter for screening for lipoid disorders: Secondary | ICD-10-CM | POA: Diagnosis not present

## 2020-05-07 DIAGNOSIS — F3289 Other specified depressive episodes: Secondary | ICD-10-CM

## 2020-05-07 DIAGNOSIS — Z1329 Encounter for screening for other suspected endocrine disorder: Secondary | ICD-10-CM | POA: Diagnosis not present

## 2020-05-07 DIAGNOSIS — R4689 Other symptoms and signs involving appearance and behavior: Secondary | ICD-10-CM

## 2020-05-07 DIAGNOSIS — R972 Elevated prostate specific antigen [PSA]: Secondary | ICD-10-CM

## 2020-05-07 DIAGNOSIS — E785 Hyperlipidemia, unspecified: Secondary | ICD-10-CM

## 2020-05-07 DIAGNOSIS — K7290 Hepatic failure, unspecified without coma: Secondary | ICD-10-CM | POA: Diagnosis not present

## 2020-05-07 DIAGNOSIS — Z13228 Encounter for screening for other metabolic disorders: Secondary | ICD-10-CM

## 2020-05-07 DIAGNOSIS — K7682 Hepatic encephalopathy: Secondary | ICD-10-CM

## 2020-05-07 NOTE — Patient Instructions (Signed)
° ° ° °  If you have lab work done today you will be contacted with your lab results within the next 2 weeks.  If you have not heard from us then please contact us. The fastest way to get your results is to register for My Chart. ° ° °IF you received an x-ray today, you will receive an invoice from [facility]. Please contact [facility] at [phone] with questions or concerns regarding your invoice.  ° °IF you received labwork today, you will receive an invoice from [facility]. Please contact [facility] at [phone] with questions or concerns regarding your invoice.  ° °Our billing staff will not be able to assist you with questions regarding bills from these companies. ° °You will be contacted with the lab results as soon as they are available. The fastest way to get your results is to activate your My Chart account. Instructions are located on the last page of this paperwork. If you have not heard from us regarding the results in 2 weeks, please contact this office. °  ° ° ° °

## 2020-05-07 NOTE — Progress Notes (Signed)
Established Patient Office Visit  Subjective:  Patient ID: Nathan Hunt, male    DOB: 1950/08/18  Age: 69 y.o. MRN: [id number]  CC:  Chief Complaint  Patient presents with  . Hospitalization Follow-up    Patient states he is here today for an hospital follow up . Patient states he was at his lawyer office and ask somebody to call 911 then he was transported to a behavioral hospital.     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 He then remembers being in the back seat of a police vehicle being transported to the ER. He was admitted for 2-3 days with police supervision throughout. He states he was in severe pain and is generally unsure of what transpired during this time. After that, he was sent to a behavioral health unit where he stayed for around 10 days. They started him on risperidone and trazodone with dc plans to resume care at [facility] and hopefully transition to monthly paliperidone injections. He is seeing Alemu Mengitsu NP as a prescriber at [facility].   Today he comes in with a myriad of questions - largely centered around what his diagnosis is and why other potential diagnoses have been discussed. He admits his memory has gotten worse in the preceding years, but attributes this to not having access to paper bank records, which he would reference to establish dates that events occurred. Now that the records are electronic, he feels more lost.  In regards to hepatic encephalopathy, we discussed that an isolated elevated ammonia in 2017 is an outlier rather than a pattern, and likely not responsible for the 4 - 6 additional episodes.   In regards to dementia, in the setting of a diagnosed psychiatric disorder, I would prefer to have him  follow up with neurology, as I am not confident we could distinguish age related change vs. Schizophrenia vs. Neurodegenerative disorder in this office.   Of note, he does state a family history of Alzheimer's Disease, but is unclear on which family members this has affected.  Other symptoms he notes at this time are: weakness, fatigue, more shortness of breath, decreased libido and less frequent erections, ongoing trouble sleeping, and worsening depression.  Past Medical History:  Diagnosis Date  . BPH (benign prostatic hyperplasia)   . Depression   . Hyperlipidemia   . Hypertension   . Insomnia   . Tendonitis   . Tinnitus     Past Surgical History:  Procedure Laterality Date  . TONSILLECTOMY  1962    Family History  Problem Relation Age of Onset  . Heart disease Mother   . Heart disease Father   . Heart disease Sister   . Bone cancer Maternal Grandfather   . Bone cancer Other   . Alzheimer's disease Other   . Colon cancer Neg Hx     Social History   Socioeconomic History  . Marital status: Single    Spouse name: Not on file  . Number of children: Not on file  . Years of education: Not on file  . Highest education level: Not on file  Occupational History  . Not on file  Tobacco Use  . Smoking status: Former Smoker    Types: Cigarettes  .  Smokeless tobacco: Never Used  Vaping Use  . Vaping Use: Never used  Substance and Sexual Activity  . Alcohol use: Yes    Alcohol/week: 0.0 standard drinks  . Drug use: No  . Sexual activity: Not on file  Other Topics Concern  . Not on file  Social History Narrative  . Not on file   Social Determinants of Health   Financial Resource Strain:   . Difficulty of Paying Living Expenses: Not on file  Food Insecurity:   . Worried About Charity fundraiser in the Last Year: Not on file  . Ran Out of Food in the Last Year: Not on file  Transportation Needs:   . Lack of Transportation (Medical): Not on file  . Lack of  Transportation (Non-Medical): Not on file  Physical Activity:   . Days of Exercise per Week: Not on file  . Minutes of Exercise per Session: Not on file  Stress:   . Feeling of Stress : Not on file  Social Connections:   . Frequency of Communication with Friends and Family: Not on file  . Frequency of Social Gatherings with Friends and Family: Not on file  . Attends Religious Services: Not on file  . Active Member of Clubs or Organizations: Not on file  . Attends Archivist Meetings: Not on file  . Marital Status: Not on file  Intimate Partner Violence:   . Fear of Current or Ex-Partner: Not on file  . Emotionally Abused: Not on file  . Physically Abused: Not on file  . Sexually Abused: Not on file    Outpatient Medications Prior to Visit  Medication Sig Dispense Refill  . cetirizine (ZYRTEC) 5 MG tablet Take 5 mg by mouth daily.    . traZODone (DESYREL) 50 MG tablet Take 0.5-1 tablets (25-50 mg total) by mouth at bedtime as needed for sleep. 90 tablet 0  . atorvastatin (LIPITOR) 20 MG tablet Take 1 tablet (20 mg total) by mouth daily. NO MORE REFILLS WITHOUT OFFICE VISIT - 2ND NOTICE (Patient not taking: Reported on 10/31/2019) 90 tablet 3  . escitalopram (LEXAPRO) 10 MG tablet Take 10 mg by mouth daily. (Patient not taking: Reported on 11/10/2019)     No facility-administered medications prior to visit.    No Known Allergies  ROS Review of Systems Per hpi     Objective:    Physical Exam Constitutional:      General: He is not in acute distress.    Appearance: Normal appearance. He is normal weight. He is not ill-appearing, toxic-appearing or diaphoretic.  Cardiovascular:     Rate and Rhythm: Normal rate and regular rhythm.     Heart sounds: Normal heart sounds. No murmur heard.  No friction rub. No gallop.   Pulmonary:     Effort: Pulmonary effort is normal. No respiratory distress.     Breath sounds: Normal breath sounds. No stridor. No wheezing, rhonchi or  rales.  Chest:     Chest wall: No tenderness.  Skin:    General: Skin is warm and dry.  Neurological:     General: No focal deficit present.     Mental Status: He is alert and oriented to person, place, and time. Mental status is at baseline.     Cranial Nerves: Cranial nerves are intact. No cranial nerve deficit.     Sensory: No sensory deficit.     Motor: No weakness.     Coordination: Coordination is intact. Coordination normal.  Deep Tendon Reflexes: Reflexes normal.     Comments: Question of shuffling gait - I am unfamiliar with his baseline.  Psychiatric:        Attention and Perception: Attention and perception normal.        Mood and Affect: Mood is anxious and depressed.        Speech: He is communicative. Speech is tangential (at times, mild, easily redirectable.). Speech is not rapid and pressured, delayed or slurred.        Behavior: Behavior normal. Behavior is not agitated, slowed, aggressive, withdrawn, hyperactive or combative. Behavior is cooperative.        Thought Content: Thought content normal.        Cognition and Memory: Cognition is not impaired. Memory is impaired. He exhibits impaired recent memory and impaired remote memory.        Judgment: Judgment normal. Judgment is not impulsive or inappropriate.     BP (!) 144/85   Pulse 67   Temp 98 F (36.7 C) (Temporal)   Resp 18   Ht $R'5\' 10"'Dv$  (1.778 m)   Wt 174 lb 12.8 oz (79.3 kg)   SpO2 96%   BMI 25.08 kg/m  Wt Readings from Last 3 Encounters:  05/07/20 174 lb 12.8 oz (79.3 kg)  11/10/19 170 lb (77.1 kg)  10/31/19 171 lb 9.6 oz (77.8 kg)     There are no preventive care reminders to display for this patient.  There are no preventive care reminders to display for this patient.  Lab Results  Component Value Date   TSH 2.730 09/26/2019   Lab Results  Component Value Date   WBC 6.6 09/26/2019   HGB 14.5 09/26/2019   HCT 42.9 09/26/2019   MCV 91 09/26/2019   PLT 244 09/26/2019   Lab Results    Component Value Date   NA 143 09/26/2019   K 4.2 09/26/2019   CO2 23 09/26/2019   GLUCOSE 82 09/26/2019   BUN 16 09/26/2019   CREATININE 1.06 09/26/2019   BILITOT 0.3 09/26/2019   ALKPHOS 102 09/26/2019   AST 27 09/26/2019   ALT 29 09/26/2019   PROT 6.7 09/26/2019   ALBUMIN 4.1 09/26/2019   CALCIUM 9.3 09/26/2019   Lab Results  Component Value Date   CHOL 218 (H) 09/26/2019   Lab Results  Component Value Date   HDL 28 (L) 09/26/2019   Lab Results  Component Value Date   LDLCALC 123 (H) 09/26/2019   Lab Results  Component Value Date   TRIG 377 (H) 09/26/2019   Lab Results  Component Value Date   CHOLHDL 7.8 (H) 09/26/2019   Lab Results  Component Value Date   HGBA1C 5.2 09/26/2019      Assessment & Plan:   Problem List Items Addressed This Visit      Other   Depression   Relevant Orders   CBC With Differential   Hyperlipidemia   Relevant Orders   Lipid panel    Other Visit Diagnoses    Screening PSA (prostate specific antigen)    -  Primary   Relevant Orders   PSA   Hepatic encephalopathy ([place])       Relevant Orders   Ammonia   Screening for endocrine, metabolic and immunity disorder       Relevant Orders   CBC With Differential   Comprehensive metabolic panel   Hemoglobin A1c   TSH   Lipid screening       Relevant Orders  Lipid panel   Elevated PSA       Relevant Orders   PSA   History of elevated glucose       Relevant Orders   Hemoglobin A1c   Cognitive and behavioral changes       Relevant Orders   MR Brain W Wo Contrast   Ambulatory referral to Neurology      No orders of the defined types were placed in this encounter.   Follow-up: No follow-ups on file.   PLAN  While this patient has been seen in a variety of inpatient and outpatient settings, it seems that he has not been given a full neurological assessment. I think it is worth sending him to neurology as a diagnosis of schizophrenia or schizoaffective disorder later  in life would be very uncommon - while a family history of Alzheimer's Disease is concerning for his own neurodegenerative process.  Will order mri brain. Note that last one in system is 09/01/2012 ct head showing negative exam. During admission to [facility] in 2017, he did have a CT head showing: "Findings: Ventricles and sulci within normal limits in overall size and position for age. No intracranial hemorrhage or mass effect. No localized extra-axial fluid collections. Couple of small subcentimeter hypodensities in the basal ganglia, one on each side, images 13 and 14, are most likely perivascular spaces. Tiny old lacunar infarcts could appear similar.Paranasal sinuses are satisfactorily aerated aside from mild mucosal thickening in bilateral ethmoids. Calvarium grossly intact.", While at the time this was read as "essentially negative noncontrast head CT.", updated imaging on these findings, namely the hypodensities in the basal ganglia worth investigating.  Will draw labs to check metabolic and other abnormalities that may be affected or are affecting his condition  Discussed plan with patient at length, who demonstrates understanding  Patient encouraged to call clinic with any questions, comments, or concerns.    Maximiano Coss, NP

## 2020-05-08 ENCOUNTER — Other Ambulatory Visit: Payer: Self-pay | Admitting: Registered Nurse

## 2020-05-08 LAB — COMPREHENSIVE METABOLIC PANEL
ALT: 40 IU/L (ref 0–44)
AST: 23 IU/L (ref 0–40)
Albumin/Globulin Ratio: 2 (ref 1.2–2.2)
Albumin: 4.4 g/dL (ref 3.8–4.8)
Alkaline Phosphatase: 84 IU/L (ref 44–121)
BUN/Creatinine Ratio: 18 (ref 10–24)
BUN: 16 mg/dL (ref 8–27)
Bilirubin Total: 0.5 mg/dL (ref 0.0–1.2)
CO2: 24 mmol/L (ref 20–29)
Calcium: 9.1 mg/dL (ref 8.6–10.2)
Chloride: 101 mmol/L (ref 96–106)
Creatinine, Ser: 0.91 mg/dL (ref 0.76–1.27)
GFR calc Af Amer: 99 mL/min/{1.73_m2} (ref >59)
GFR calc non Af Amer: 86 mL/min/{1.73_m2} (ref >59)
Globulin, Total: 2.2 g/dL (ref 1.5–4.5)
Glucose: 85 mg/dL (ref 65–99)
Potassium: 4.2 mmol/L (ref 3.5–5.2)
Sodium: 139 mmol/L (ref 134–144)
Total Protein: 6.6 g/dL (ref 6.0–8.5)

## 2020-05-08 LAB — LIPID PANEL
Chol/HDL Ratio: 7.5 ratio — ABNORMAL HIGH (ref 0.0–5.0)
Cholesterol, Total: 254 mg/dL — ABNORMAL HIGH (ref 100–199)
HDL: 34 mg/dL — ABNORMAL LOW (ref >39)
LDL Chol Calc (NIH): 184 mg/dL — ABNORMAL HIGH (ref 0–99)
Triglycerides: 191 mg/dL — ABNORMAL HIGH (ref 0–149)
VLDL Cholesterol Cal: 36 mg/dL (ref 5–40)

## 2020-05-08 LAB — CBC WITH DIFFERENTIAL
Basophils Absolute: 0.1 10*3/uL (ref 0.0–0.2)
Basos: 1 %
EOS (ABSOLUTE): 0.1 10*3/uL (ref 0.0–0.4)
Eos: 2 %
Hematocrit: 44.8 % (ref 37.5–51.0)
Hemoglobin: 15 g/dL (ref 13.0–17.7)
Immature Grans (Abs): 0 10*3/uL (ref 0.0–0.1)
Immature Granulocytes: 0 %
Lymphocytes Absolute: 1.8 10*3/uL (ref 0.7–3.1)
Lymphs: 26 %
MCH: 30.3 pg (ref 26.6–33.0)
MCHC: 33.5 g/dL (ref 31.5–35.7)
MCV: 91 fL (ref 79–97)
Monocytes Absolute: 0.6 10*3/uL (ref 0.1–0.9)
Monocytes: 9 %
Neutrophils Absolute: 4.3 10*3/uL (ref 1.4–7.0)
Neutrophils: 62 %
RBC: 4.95 x10E6/uL (ref 4.14–5.80)
RDW: 12.8 % (ref 11.6–15.4)
WBC: 6.9 10*3/uL (ref 3.4–10.8)

## 2020-05-08 LAB — TSH: TSH: 2.07 u[IU]/mL (ref 0.450–4.500)

## 2020-05-08 LAB — AMMONIA: Ammonia: 47 ug/dL (ref 40–200)

## 2020-05-08 LAB — PSA: Prostate Specific Ag, Serum: 3.7 ng/mL (ref 0.0–4.0)

## 2020-05-08 LAB — HEMOGLOBIN A1C
Est. average glucose Bld gHb Est-mCnc: 105 mg/dL
Hgb A1c MFr Bld: 5.3 % (ref 4.8–5.6)

## 2020-07-16 ENCOUNTER — Encounter: Payer: Self-pay | Admitting: Registered Nurse

## 2020-07-16 ENCOUNTER — Other Ambulatory Visit: Payer: Self-pay

## 2020-07-16 ENCOUNTER — Ambulatory Visit
Admission: RE | Admit: 2020-07-16 | Discharge: 2020-07-16 | Disposition: A | Discharge status: Home / Self Care | Payer: Medicare Other | Source: Ambulatory Visit | Attending: Registered Nurse | Admitting: Registered Nurse

## 2020-07-16 ENCOUNTER — Ambulatory Visit (INDEPENDENT_AMBULATORY_CARE_PROVIDER_SITE_OTHER): Payer: Medicare Other | Admitting: Registered Nurse

## 2020-07-16 VITALS — BP 111/70 | HR 105 | Temp 98.0°F | Resp 18 | Ht 70.0 in | Wt 165.2 lb

## 2020-07-16 DIAGNOSIS — R42 Dizziness and giddiness: Secondary | ICD-10-CM

## 2020-07-16 DIAGNOSIS — R972 Elevated prostate specific antigen [PSA]: Secondary | ICD-10-CM

## 2020-07-16 DIAGNOSIS — Z87898 Personal history of other specified conditions: Secondary | ICD-10-CM | POA: Diagnosis not present

## 2020-07-16 DIAGNOSIS — R4589 Other symptoms and signs involving emotional state: Secondary | ICD-10-CM

## 2020-07-16 DIAGNOSIS — Z8639 Personal history of other endocrine, nutritional and metabolic disease: Secondary | ICD-10-CM

## 2020-07-16 DIAGNOSIS — E559 Vitamin D deficiency, unspecified: Secondary | ICD-10-CM

## 2020-07-16 DIAGNOSIS — R6889 Other general symptoms and signs: Secondary | ICD-10-CM

## 2020-07-16 DIAGNOSIS — R9431 Abnormal electrocardiogram [ECG] [EKG]: Secondary | ICD-10-CM

## 2020-07-16 DIAGNOSIS — R5382 Chronic fatigue, unspecified: Secondary | ICD-10-CM

## 2020-07-16 DIAGNOSIS — R0602 Shortness of breath: Secondary | ICD-10-CM

## 2020-07-16 DIAGNOSIS — Z8669 Personal history of other diseases of the nervous system and sense organs: Secondary | ICD-10-CM

## 2020-07-16 DIAGNOSIS — E785 Hyperlipidemia, unspecified: Secondary | ICD-10-CM

## 2020-07-16 DIAGNOSIS — N179 Acute kidney failure, unspecified: Secondary | ICD-10-CM

## 2020-07-16 IMAGING — CR DG CHEST 2V
2 series · 2 of 2 positions shown · non-contrast
Comparison: None.

CLINICAL DATA: Abnormal EKG.

EXAM:
CHEST - 2 VIEW

[w chest pa]
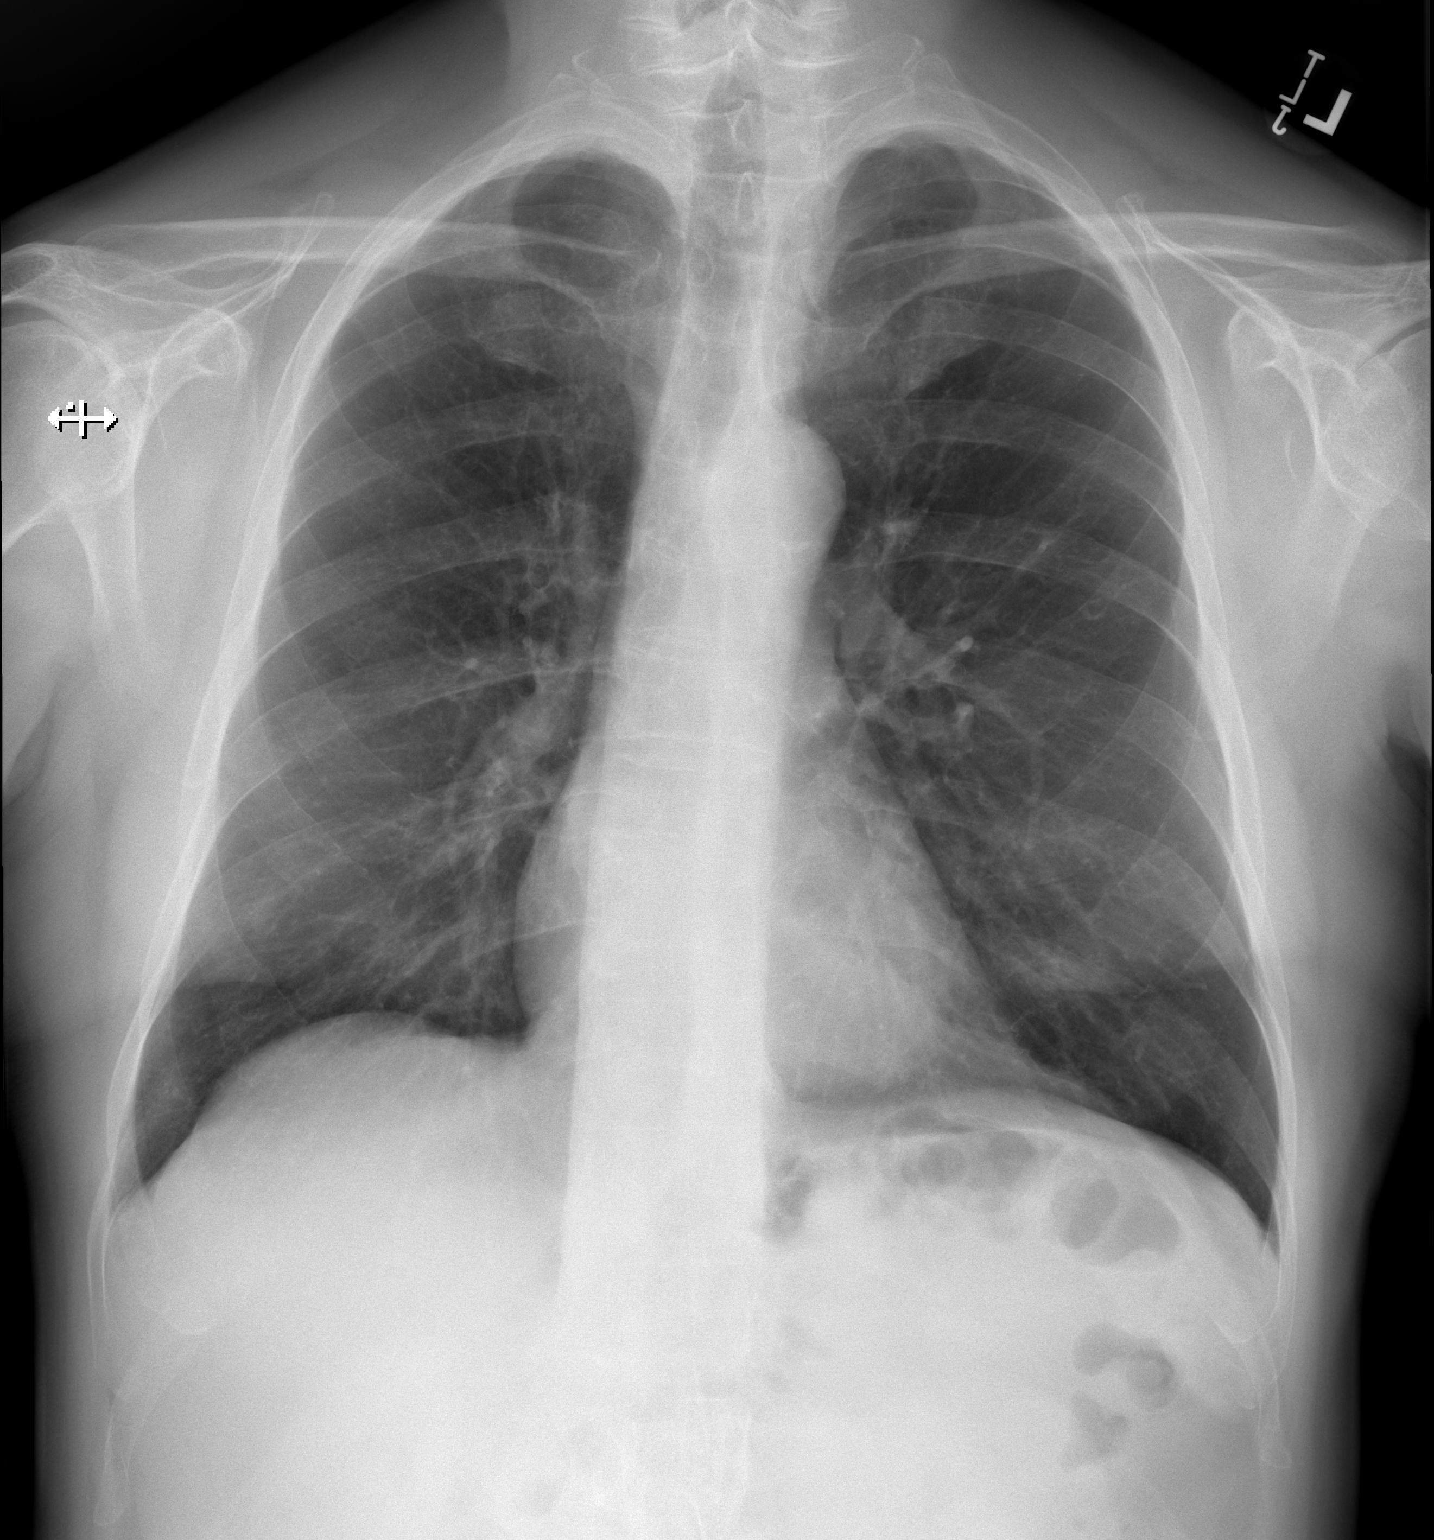

[w chest lat]
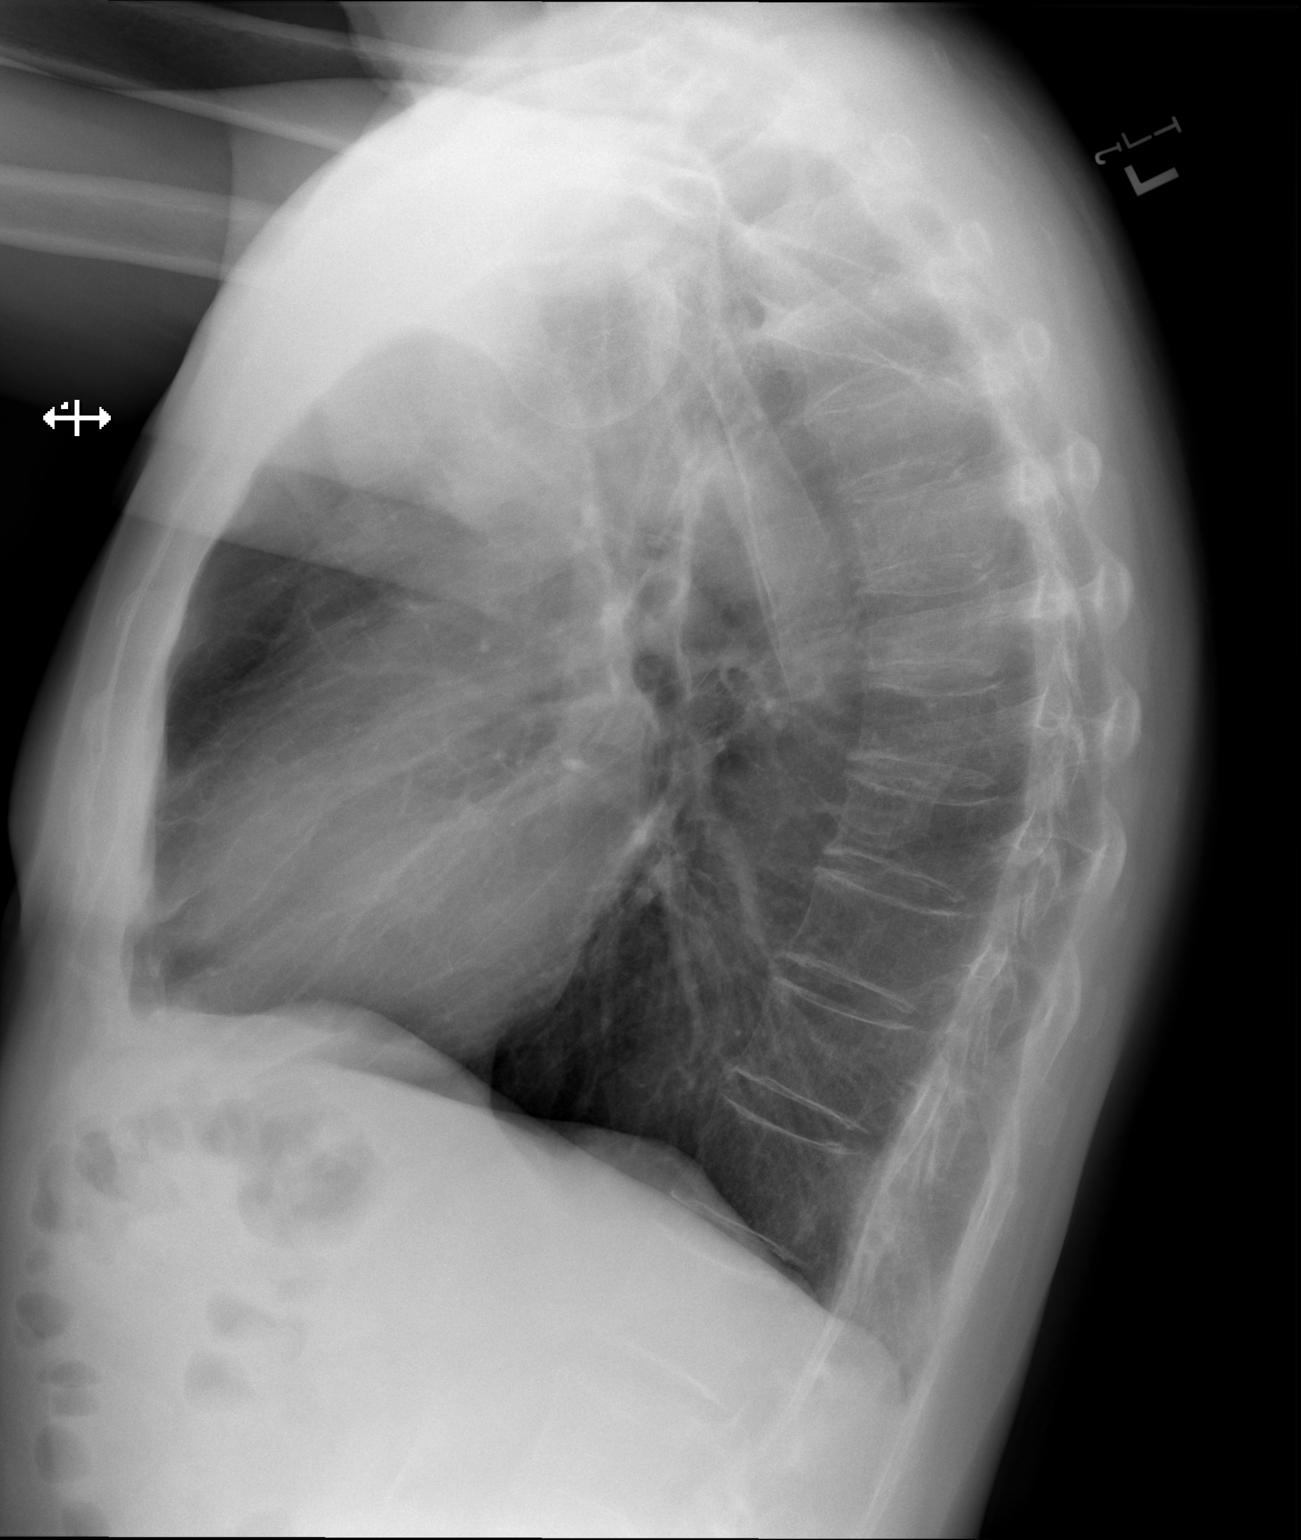

[2 of 2 positions shown; findings below may reference images not displayed]

FINDINGS: Lungs are adequately inflated and otherwise clear. Cardiomediastinal
silhouette is normal. Minimal degenerative change of the spine.
IMPRESSION: No active cardiopulmonary disease.

## 2020-07-16 NOTE — Progress Notes (Signed)
Acute Office Visit  Subjective:    Patient ID: Nathan Hunt, male    DOB: Apr 06, 1951, 70 y.o.   MRN: [id number]  Chief Complaint  Patient presents with  . Follow-up    Patient states he is here for a follow up. Per patient he has not being feeling well, and states that his head has been feeling a little heavy and lightheaded when he lift the weights.    HPI Patient is in today for malaise  Feeling tired, down, depressed.  Easily shob, fatigued, and weak No sudden weight changes Has been trying to be more active but is having a hard time Unsure if his depression and anxiety is poorly controlled from a medical perspective or if there is a physical health aspect to things No new headaches or visual changes Does feel sometimes that his heart is pounding, but no irregular beats Has not happened before No acute changes, has been steady onset over past month or two.  Past Medical History:  Diagnosis Date  . BPH (benign prostatic hyperplasia)   . Depression   . Hyperlipidemia   . Hypertension   . Insomnia   . Tendonitis   . Tinnitus     Past Surgical History:  Procedure Laterality Date  . TONSILLECTOMY  1962    Family History  Problem Relation Age of Onset  . Heart disease Mother   . Heart disease Father   . Heart disease Sister   . Bone cancer Maternal Grandfather   . Bone cancer Other   . Alzheimer's disease Other   . Colon cancer Neg Hx     Social History   Socioeconomic History  . Marital status: Single    Spouse name: Not on file  . Number of children: Not on file  . Years of education: Not on file  . Highest education level: Not on file  Occupational History  . Not on file  Tobacco Use  . Smoking status: Former Smoker    Types: Cigarettes  . Smokeless tobacco: Never Used  Vaping Use  . Vaping Use: Never used  Substance and Sexual Activity  . Alcohol use: Yes    Alcohol/week: 0.0 standard drinks  . Drug use: No  . Sexual activity: Not on file   Other Topics Concern  . Not on file  Social History Narrative  . Not on file   Social Determinants of Health   Financial Resource Strain: Not on file  Food Insecurity: Not on file  Transportation Needs: Not on file  Physical Activity: Not on file  Stress: Not on file  Social Connections: Not on file  Intimate Partner Violence: Not on file    Outpatient Medications Prior to Visit  Medication Sig Dispense Refill  . atorvastatin (LIPITOR) 20 MG tablet Take 1 tablet (20 mg total) by mouth daily. NO MORE REFILLS WITHOUT OFFICE VISIT - 2ND NOTICE 90 tablet 3  . cetirizine (ZYRTEC) 5 MG tablet Take 5 mg by mouth daily.    Marland Kitchen escitalopram (LEXAPRO) 10 MG tablet Take 10 mg by mouth daily.    . traZODone (DESYREL) 50 MG tablet Take 0.5-1 tablets (25-50 mg total) by mouth at bedtime as needed for sleep. 90 tablet 0   No facility-administered medications prior to visit.    No Known Allergies  Review of Systems Per hpi     Objective:    Physical Exam Vitals and nursing note reviewed.  Constitutional:      Appearance: Normal appearance.  Cardiovascular:  Rate and Rhythm: Normal rate and regular rhythm.     Pulses: Normal pulses.     Heart sounds: Normal heart sounds. No murmur heard. No friction rub. No gallop.   Pulmonary:     Effort: Pulmonary effort is normal. No respiratory distress.     Breath sounds: Normal breath sounds. No stridor. No wheezing, rhonchi or rales.  Skin:    General: Skin is warm and dry.     Capillary Refill: Capillary refill takes less than 2 seconds.  Neurological:     General: No focal deficit present.     Mental Status: He is alert. Mental status is at baseline.  Psychiatric:        Mood and Affect: Mood normal.        Behavior: Behavior normal.        Thought Content: Thought content normal.        Judgment: Judgment normal.     BP 111/70   Pulse (!) 105   Temp 98 F (36.7 C) (Temporal)   Resp 18   Ht 5\' 10"  (1.778 m)   Wt 165 lb 3.2  oz (74.9 kg)   SpO2 95%   BMI 23.70 kg/m  Wt Readings from Last 3 Encounters:  07/16/20 165 lb 3.2 oz (74.9 kg)  05/07/20 174 lb 12.8 oz (79.3 kg)  11/10/19 170 lb (77.1 kg)    There are no preventive care reminders to display for this patient.  There are no preventive care reminders to display for this patient.   Lab Results  Component Value Date   TSH 2.070 05/07/2020   Lab Results  Component Value Date   WBC 6.9 05/07/2020   HGB 15.0 05/07/2020   HCT 44.8 05/07/2020   MCV 91 05/07/2020   PLT 244 09/26/2019   Lab Results  Component Value Date   NA 139 05/07/2020   K 4.2 05/07/2020   CO2 24 05/07/2020   GLUCOSE 85 05/07/2020   BUN 16 05/07/2020   CREATININE 0.91 05/07/2020   BILITOT 0.5 05/07/2020   ALKPHOS 84 05/07/2020   AST 23 05/07/2020   ALT 40 05/07/2020   PROT 6.6 05/07/2020   ALBUMIN 4.4 05/07/2020   CALCIUM 9.1 05/07/2020   Lab Results  Component Value Date   CHOL 254 (H) 05/07/2020   Lab Results  Component Value Date   HDL 34 (L) 05/07/2020   Lab Results  Component Value Date   LDLCALC 184 (H) 05/07/2020   Lab Results  Component Value Date   TRIG 191 (H) 05/07/2020   Lab Results  Component Value Date   CHOLHDL 7.5 (H) 05/07/2020   Lab Results  Component Value Date   HGBA1C 5.3 05/07/2020       Assessment & Plan:   Problem List Items Addressed This Visit      Other   Hyperlipidemia   Relevant Orders   Lipid panel    Other Visit Diagnoses    Lightheaded    -  Primary   Relevant Orders   EKG 12-Lead (Completed)   CBC With Differential   Comprehensive metabolic panel   TSH   Urinalysis   Vitamin B12   History of elevated PSA       History of encephalopathy       Relevant Orders   Ammonia   Depressed mood       Relevant Orders   TSH   Chronic fatigue       Relevant Orders   CBC With  Differential   Comprehensive metabolic panel   TSH   Vitamin B12   History of elevated glucose       Relevant Orders   Hemoglobin  A1c   History of hypoglycemia       Relevant Orders   Insulin and C-Peptide   Shortness of breath       Relevant Orders   Brain natriuretic peptide   AKI (acute kidney injury) ([place])       Elevated PSA       Relevant Orders   PSA   Other general symptoms and signs        Relevant Orders   Vitamin B12   History of vitamin D deficiency       Relevant Orders   Vitamin D, 25-hydroxy   Vitamin D deficiency, unspecified        Relevant Orders   Vitamin D, 25-hydroxy   Nonspecific abnormal electrocardiogram (ECG) (EKG)       Relevant Orders   DG Chest 2 View       No orders of the defined types were placed in this encounter.  PLAN  Exam reassuring  Given his somewhat broad symptoms, will draw a number of labs and opt for cxr to help rule out pulmonary infection or process as well as rule out any other CV concerns   EKG compared to 08/07/13 - no acute changes. EKG read from today states potential pulmonary disease but review of EKG does not find correlation nor does exam   Close follow up with patient - would like to rule out possible contributing factors prior to his follow up with psychiatry next week.  Patient encouraged to call clinic with any questions, comments, or concerns.   Maximiano Coss, NP

## 2020-07-16 NOTE — Patient Instructions (Signed)
° ° ° °  If you have lab work done today you will be contacted with your lab results within the next 2 weeks.  If you have not heard from us then please contact us. The fastest way to get your results is to register for My Chart. ° ° °IF you received an x-ray today, you will receive an invoice from [facility]. Please contact [facility] at [phone] with questions or concerns regarding your invoice.  ° °IF you received labwork today, you will receive an invoice from [facility]. Please contact [facility] at [phone] with questions or concerns regarding your invoice.  ° °Our billing staff will not be able to assist you with questions regarding bills from these companies. ° °You will be contacted with the lab results as soon as they are available. The fastest way to get your results is to activate your My Chart account. Instructions are located on the last page of this paperwork. If you have not heard from us regarding the results in 2 weeks, please contact this office. °  ° ° ° °

## 2020-07-17 LAB — CBC WITH DIFFERENTIAL
Basophils Absolute: 0.1 10*3/uL (ref 0.0–0.2)
Basos: 1 %
EOS (ABSOLUTE): 0 10*3/uL (ref 0.0–0.4)
Eos: 0 %
Hematocrit: 45 % (ref 37.5–51.0)
Hemoglobin: 15.1 g/dL (ref 13.0–17.7)
Immature Grans (Abs): 0 10*3/uL (ref 0.0–0.1)
Immature Granulocytes: 0 %
Lymphocytes Absolute: 1 10*3/uL (ref 0.7–3.1)
Lymphs: 12 %
MCH: 30.4 pg (ref 26.6–33.0)
MCHC: 33.6 g/dL (ref 31.5–35.7)
MCV: 91 fL (ref 79–97)
Monocytes Absolute: 0.6 10*3/uL (ref 0.1–0.9)
Monocytes: 8 %
Neutrophils Absolute: 6.6 10*3/uL (ref 1.4–7.0)
Neutrophils: 79 %
RBC: 4.97 x10E6/uL (ref 4.14–5.80)
RDW: 13.1 % (ref 11.6–15.4)
WBC: 8.3 10*3/uL (ref 3.4–10.8)

## 2020-07-17 LAB — COMPREHENSIVE METABOLIC PANEL
ALT: 21 IU/L (ref 0–44)
AST: 20 IU/L (ref 0–40)
Albumin/Globulin Ratio: 1.8 (ref 1.2–2.2)
Albumin: 4.4 g/dL (ref 3.8–4.8)
Alkaline Phosphatase: 67 IU/L (ref 44–121)
BUN/Creatinine Ratio: 13 (ref 10–24)
BUN: 16 mg/dL (ref 8–27)
Bilirubin Total: 0.3 mg/dL (ref 0.0–1.2)
CO2: 23 mmol/L (ref 20–29)
Calcium: 9.5 mg/dL (ref 8.6–10.2)
Chloride: 100 mmol/L (ref 96–106)
Creatinine, Ser: 1.2 mg/dL (ref 0.76–1.27)
GFR calc Af Amer: 70 mL/min/{1.73_m2} (ref >59)
GFR calc non Af Amer: 61 mL/min/{1.73_m2} (ref >59)
Globulin, Total: 2.4 g/dL (ref 1.5–4.5)
Glucose: 98 mg/dL (ref 65–99)
Potassium: 4.2 mmol/L (ref 3.5–5.2)
Sodium: 139 mmol/L (ref 134–144)
Total Protein: 6.8 g/dL (ref 6.0–8.5)

## 2020-07-17 LAB — INSULIN AND C-PEPTIDE, SERUM
C-Peptide: 6.1 ng/mL — ABNORMAL HIGH (ref 1.1–4.4)
INSULIN: 18.9 u[IU]/mL (ref 2.6–24.9)

## 2020-07-17 LAB — VITAMIN B12: Vitamin B-12: 898 pg/mL (ref 232–1245)

## 2020-07-17 LAB — HEMOGLOBIN A1C
Est. average glucose Bld gHb Est-mCnc: 108 mg/dL
Hgb A1c MFr Bld: 5.4 % (ref 4.8–5.6)

## 2020-07-17 LAB — PSA: Prostate Specific Ag, Serum: 3.9 ng/mL (ref 0.0–4.0)

## 2020-07-17 LAB — TSH: TSH: 2.39 u[IU]/mL (ref 0.450–4.500)

## 2020-07-17 LAB — LIPID PANEL
Chol/HDL Ratio: 6.5 ratio — ABNORMAL HIGH (ref 0.0–5.0)
Cholesterol, Total: 241 mg/dL — ABNORMAL HIGH (ref 100–199)
HDL: 37 mg/dL — ABNORMAL LOW (ref >39)
LDL Chol Calc (NIH): 173 mg/dL — ABNORMAL HIGH (ref 0–99)
Triglycerides: 169 mg/dL — ABNORMAL HIGH (ref 0–149)
VLDL Cholesterol Cal: 31 mg/dL (ref 5–40)

## 2020-07-17 LAB — VITAMIN D 25 HYDROXY (VIT D DEFICIENCY, FRACTURES): Vit D, 25-Hydroxy: 38.7 ng/mL (ref 30.0–100.0)

## 2020-07-17 LAB — AMMONIA: Ammonia: 26 ug/dL — ABNORMAL LOW (ref 40–200)

## 2020-07-17 LAB — BRAIN NATRIURETIC PEPTIDE: BNP: 28.7 pg/mL (ref 0.0–100.0)

## 2020-07-17 NOTE — Progress Notes (Signed)
Dr. Tarri Glenn -   I had the attached patient see you last summer for a supposed history of hepatic encephalopathy with a number of incidents of acute mental status changes but only 1-2 episodes of elevated ammonia in the preceding years, and normal ammonia levels through some of those changes. Now, he has a low ammonia level. Given his atypical presentation I want to make sure I do my due diligence to address any and all concerns he may have - though I still agree that his symptoms are psychiatric in nature, is there any chance he could have an atypical presentation of a urea cycle disorder? He's a fairly poor historian so I'm fairly lost on objective data besides what's available in his chart.  Thanks!  Rich

## 2020-07-31 NOTE — Progress Notes (Signed)
Thank you! I'll offer those options to him.  Rich

## 2020-08-13 ENCOUNTER — Other Ambulatory Visit: Payer: Self-pay

## 2020-08-13 ENCOUNTER — Encounter: Payer: Self-pay | Admitting: Registered Nurse

## 2020-08-13 ENCOUNTER — Ambulatory Visit (INDEPENDENT_AMBULATORY_CARE_PROVIDER_SITE_OTHER): Payer: Medicare Other | Admitting: Registered Nurse

## 2020-08-13 VITALS — BP 103/69 | HR 71 | Temp 98.0°F | Resp 18 | Ht 70.0 in | Wt 157.2 lb

## 2020-08-13 DIAGNOSIS — R197 Diarrhea, unspecified: Secondary | ICD-10-CM

## 2020-08-13 DIAGNOSIS — R1084 Generalized abdominal pain: Secondary | ICD-10-CM | POA: Diagnosis not present

## 2020-08-13 DIAGNOSIS — K921 Melena: Secondary | ICD-10-CM | POA: Diagnosis not present

## 2020-08-13 DIAGNOSIS — R634 Abnormal weight loss: Secondary | ICD-10-CM | POA: Diagnosis not present

## 2020-08-13 DIAGNOSIS — R112 Nausea with vomiting, unspecified: Secondary | ICD-10-CM | POA: Diagnosis not present

## 2020-08-13 MED ORDER — ONDANSETRON HCL 4 MG PO TABS: 4.0000 mg | ORAL_TABLET | Freq: Two times a day (BID) | ORAL | 0 refills | Status: DC | PRN

## 2020-08-13 NOTE — Patient Instructions (Addendum)
[name] -    Nathan Hunt to see you today.   Let's try ondansetron 4mg  by mouth twice daily as needed for diarrhea and nausea.  I want to get a CT scan of your abdomen done. The imaging location will call you to schedule this.  In general I recommend a very bland diet to help out your bowels: Banana Rice Applesauce Toast  Further food recommendations are below  Push plenty of fluids in the coming days.  I will contact you when the imaging results are in.  Thank you,  Rich   If you have lab work done today you will be contacted with your lab results within the next 2 weeks.  If you have not heard from [place] then please contact us. The fastest way to get your results is to register for My Chart.   IF you received an x-ray today, you will receive an invoice from [facility]. Please contact [facility] at [phone] with questions or concerns regarding your invoice.   IF you received labwork today, you will receive an invoice from [facility]. Please contact [facility] at [phone] with questions or concerns regarding your invoice.   Our billing staff will not be able to assist you with questions regarding bills from these companies.  You will be contacted with the lab results as soon as they are available. The fastest way to get your results is to activate your My Chart account. Instructions are located on the last page of this paperwork. If you have not heard from [place] regarding the results in 2 weeks, please contact this office.      Food Choices to Help Relieve Diarrhea, Adult Diarrhea can make you feel weak and cause you to become dehydrated. It is important to choose the right foods and drinks to:  Relieve diarrhea.  Replace lost fluids and nutrients.  Prevent dehydration. What are tips for following this plan? Relieving diarrhea  Avoid foods that make your diarrhea worse. These may include: ? Foods and beverages sweetened with high-fructose corn  syrup, honey, or sweeteners such as xylitol, sorbitol, and mannitol. ? Fried, greasy, or spicy foods. ? Raw fruits and vegetables.  Eat foods that are rich in probiotics. These include foods such as yogurt and fermented milk products. Probiotics can help increase healthy bacteria in your stomach and intestines (gastrointestinal tract or GI tract). This may help digestion and stop diarrhea.  If you have lactose intolerance, avoid dairy products. These may make your diarrhea worse.  Take medicine to help stop diarrhea only as told by your health care provider. Replacing nutrients  Eat bland, easy-to-digest foods in small amounts as you are able, until your diarrhea starts to get better. These foods include bananas, applesauce, rice, toast, and crackers.  Gradually reintroduce nutrient-rich foods as tolerated or as told by your health care provider. This includes: ? Well-cooked protein foods, such as eggs, lean meats like fish or chicken without skin, and tofu. ? Peeled, seeded, and soft-cooked fruits and vegetables. ? Low-fat dairy products. ? Whole grains.  Take vitamin and mineral supplements as told by your health care provider.   Preventing dehydration  Start by sipping water or a solution to prevent dehydration (oral rehydration solution, ORS). This is a drink that helps replace fluids and minerals your body has lost. You can buy an ORS at pharmacies and retail stores.  Try to drink at least 8-10 cups (2,000-2,500 mL) of fluid each day to help replace lost fluids. If you have urine  that is pale yellow, you are getting enough fluids.  You may drink other liquids in addition to water, such as fruit juice that you have added water to (diluted fruit juice) or low-calorie sports drinks, as tolerated or as told by your health care provider.  Avoid drinks with caffeine, such as coffee, tea, or soft drinks.  Avoid alcohol.   Summary  When you have diarrhea, it is important to choose the  right foods and drinks to relieve diarrhea, to replace lost fluids and nutrients, and to prevent dehydration.  Make sure you drink enough fluid to keep your urine pale yellow.  You may benefit from eating bland foods at first. Gradually reintroduce healthy, nutrient-rich foods as tolerated or as told by your health care provider.  Avoid foods that make your diarrhea worse, such as fried, greasy, or spicy foods. This information is not intended to replace advice given to you by your health care provider. Make sure you discuss any questions you have with your health care provider. Document Revised: 07/04/2019 Document Reviewed: 07/04/2019 Elsevier Patient Education  2021 [place] Choices to Help Relieve Diarrhea, Adult Diarrhea can make you feel weak and cause you to become dehydrated. It is important to choose the right foods and drinks to:  Relieve diarrhea.  Replace lost fluids and nutrients.  Prevent dehydration. What are tips for following this plan? Relieving diarrhea  Avoid foods that make your diarrhea worse. These may include: ? Foods and beverages sweetened with high-fructose corn syrup, honey, or sweeteners such as xylitol, sorbitol, and mannitol. ? Fried, greasy, or spicy foods. ? Raw fruits and vegetables.  Eat foods that are rich in probiotics. These include foods such as yogurt and fermented milk products. Probiotics can help increase healthy bacteria in your stomach and intestines (gastrointestinal tract or GI tract). This may help digestion and stop diarrhea.  If you have lactose intolerance, avoid dairy products. These may make your diarrhea worse.  Take medicine to help stop diarrhea only as told by your health care provider. Replacing nutrients  Eat bland, easy-to-digest foods in small amounts as you are able, until your diarrhea starts to get better. These foods include bananas, applesauce, rice, toast, and crackers.  Gradually reintroduce  nutrient-rich foods as tolerated or as told by your health care provider. This includes: ? Well-cooked protein foods, such as eggs, lean meats like fish or chicken without skin, and tofu. ? Peeled, seeded, and soft-cooked fruits and vegetables. ? Low-fat dairy products. ? Whole grains.  Take vitamin and mineral supplements as told by your health care provider.   Preventing dehydration  Start by sipping water or a solution to prevent dehydration (oral rehydration solution, ORS). This is a drink that helps replace fluids and minerals your body has lost. You can buy an ORS at pharmacies and retail stores.  Try to drink at least 8-10 cups (2,000-2,500 mL) of fluid each day to help replace lost fluids. If you have urine that is pale yellow, you are getting enough fluids.  You may drink other liquids in addition to water, such as fruit juice that you have added water to (diluted fruit juice) or low-calorie sports drinks, as tolerated or as told by your health care provider.  Avoid drinks with caffeine, such as coffee, tea, or soft drinks.  Avoid alcohol.   Summary  When you have diarrhea, it is important to choose the right foods and drinks to relieve diarrhea, to replace lost fluids and nutrients, and to  prevent dehydration.  Make sure you drink enough fluid to keep your urine pale yellow.  You may benefit from eating bland foods at first. Gradually reintroduce healthy, nutrient-rich foods as tolerated or as told by your health care provider.  Avoid foods that make your diarrhea worse, such as fried, greasy, or spicy foods. This information is not intended to replace advice given to you by your health care provider. Make sure you discuss any questions you have with your health care provider. Document Revised: 07/04/2019 Document Reviewed: 07/04/2019 Elsevier Patient Education  2021 [employer].

## 2020-08-13 NOTE — Progress Notes (Signed)
Established Patient Office Visit  Subjective:  Patient ID: Nathan Hunt, male    DOB: 1950-10-17  Age: 70 y.o. MRN: [id number]  CC:  Chief Complaint  Patient presents with  . Diarrhea    Patient states he has been having diarrhea and some fatigue for about 2 weeks and results of a chest xray.    HPI LANIS STORLIE presents for loose stools x 3-4 days and fatigue  Loose stools Sudden start on Friday or Saturday Having multiple movements each day though it is improving Loose, watery stool Does endorse one episode of blood in stool but unable to comment on if it was light or dark blood No melena Some nausea and decreased appetite, but attributes this to anxiety Has noted weight loss in preceding months - 17lb since beginning of December. Poor PO intake and anxiety throughout as he adjusts to psychiatric treatment through [facility].  No abdominal pain, but some bloating as he has had more flatulence. Otherwise no changes. Vitals today reassuring.   Past Medical History:  Diagnosis Date  . BPH (benign prostatic hyperplasia)   . Depression   . Hyperlipidemia   . Hypertension   . Insomnia   . Tendonitis   . Tinnitus     Past Surgical History:  Procedure Laterality Date  . TONSILLECTOMY  1962    Family History  Problem Relation Age of Onset  . Heart disease Mother   . Heart disease Father   . Heart disease Sister   . Bone cancer Maternal Grandfather   . Bone cancer Other   . Alzheimer's disease Other   . Colon cancer Neg Hx     Social History   Socioeconomic History  . Marital status: Single    Spouse name: Not on file  . Number of children: Not on file  . Years of education: Not on file  . Highest education level: Not on file  Occupational History  . Not on file  Tobacco Use  . Smoking status: Former Smoker    Types: Cigarettes  . Smokeless tobacco: Never Used  Vaping Use  . Vaping Use: Never used  Substance and Sexual Activity  . Alcohol use:  Yes    Alcohol/week: 0.0 standard drinks  . Drug use: No  . Sexual activity: Not on file  Other Topics Concern  . Not on file  Social History Narrative  . Not on file   Social Determinants of Health   Financial Resource Strain: Not on file  Food Insecurity: Not on file  Transportation Needs: Not on file  Physical Activity: Not on file  Stress: Not on file  Social Connections: Not on file  Intimate Partner Violence: Not on file    Outpatient Medications Prior to Visit  Medication Sig Dispense Refill  . atorvastatin (LIPITOR) 20 MG tablet Take 1 tablet (20 mg total) by mouth daily. NO MORE REFILLS WITHOUT OFFICE VISIT - 2ND NOTICE 90 tablet 3  . cetirizine (ZYRTEC) 5 MG tablet Take 5 mg by mouth daily.    . traZODone (DESYREL) 50 MG tablet Take 0.5-1 tablets (25-50 mg total) by mouth at bedtime as needed for sleep. 90 tablet 0  . escitalopram (LEXAPRO) 10 MG tablet Take 10 mg by mouth daily. (Patient not taking: Reported on 08/13/2020)     No facility-administered medications prior to visit.    No Known Allergies  ROS Review of Systems Per hpi     Objective:    Physical Exam Constitutional:  General: He is not in acute distress.    Appearance: Normal appearance. He is normal weight. He is not ill-appearing, toxic-appearing or diaphoretic.  Cardiovascular:     Rate and Rhythm: Normal rate and regular rhythm.     Heart sounds: Normal heart sounds. No murmur heard. No friction rub. No gallop.   Pulmonary:     Effort: Pulmonary effort is normal. No respiratory distress.     Breath sounds: Normal breath sounds. No stridor. No wheezing, rhonchi or rales.  Chest:     Chest wall: No tenderness.  Abdominal:     General: Abdomen is flat. Bowel sounds are normal. There is no distension.     Palpations: Abdomen is soft. There is no mass.     Tenderness: There is no abdominal tenderness. There is no right CVA tenderness, left CVA tenderness, guarding or rebound.      Hernia: No hernia is present.  Neurological:     General: No focal deficit present.     Mental Status: He is alert and oriented to person, place, and time. Mental status is at baseline.  Psychiatric:        Mood and Affect: Mood normal.        Behavior: Behavior normal.        Thought Content: Thought content normal.        Judgment: Judgment normal.     BP 103/69   Pulse 71   Temp 98 F (36.7 C) (Temporal)   Resp 18   Ht 5\' 10"  (1.778 m)   Wt 157 lb 3.2 oz (71.3 kg)   SpO2 98%   BMI 22.56 kg/m  Wt Readings from Last 3 Encounters:  08/13/20 157 lb 3.2 oz (71.3 kg)  07/16/20 165 lb 3.2 oz (74.9 kg)  05/07/20 174 lb 12.8 oz (79.3 kg)     There are no preventive care reminders to display for this patient.  There are no preventive care reminders to display for this patient.  Lab Results  Component Value Date   TSH 2.390 07/16/2020   Lab Results  Component Value Date   WBC 8.3 07/16/2020   HGB 15.1 07/16/2020   HCT 45.0 07/16/2020   MCV 91 07/16/2020   PLT 244 09/26/2019   Lab Results  Component Value Date   NA 139 07/16/2020   K 4.2 07/16/2020   CO2 23 07/16/2020   GLUCOSE 98 07/16/2020   BUN 16 07/16/2020   CREATININE 1.20 07/16/2020   BILITOT 0.3 07/16/2020   ALKPHOS 67 07/16/2020   AST 20 07/16/2020   ALT 21 07/16/2020   PROT 6.8 07/16/2020   ALBUMIN 4.4 07/16/2020   CALCIUM 9.5 07/16/2020   Lab Results  Component Value Date   CHOL 241 (H) 07/16/2020   Lab Results  Component Value Date   HDL 37 (L) 07/16/2020   Lab Results  Component Value Date   LDLCALC 173 (H) 07/16/2020   Lab Results  Component Value Date   TRIG 169 (H) 07/16/2020   Lab Results  Component Value Date   CHOLHDL 6.5 (H) 07/16/2020   Lab Results  Component Value Date   HGBA1C 5.4 07/16/2020      Assessment & Plan:   Problem List Items Addressed This Visit   None   Visit Diagnoses    Nausea vomiting and diarrhea    -  Primary   Relevant Medications    ondansetron (ZOFRAN) 4 MG tablet   Generalized abdominal pain  Relevant Orders   CT Abdomen Pelvis Wo Contrast   Unintended weight loss       Relevant Orders   CT Abdomen Pelvis Wo Contrast   Blood in stool       Relevant Orders   CT Abdomen Pelvis Wo Contrast      Meds ordered this encounter  Medications  . ondansetron (ZOFRAN) 4 MG tablet    Sig: Take 1 tablet (4 mg total) by mouth 2 (two) times daily as needed for nausea (diarrhea, upset stomach).    Dispense:  20 tablet    Refill:  0    Order Specific Question:   Supervising Provider    Answer:   Carlota Raspberry, JEFFREY R [[id number]]    Follow-up: No follow-ups on file.   PLAN  Short course of low dose and limited zofran to try to help with any nausea and pursue side effect of slowing bowels.   Suspect anxiety to be largest factor here but may be self limited viral gastroenteritis as well  Cannot rule out diverticulitis, pt is at times a poor historian and has not had colonoscopy, would be beneficial to pursue CT of abdomen and pelvis and follow with colonoscopy.  Discussed plan with patient who is in agreement. Diet suggestions provided. Return prn.  Patient encouraged to call clinic with any questions, comments, or concerns.  Maximiano Coss, NP

## 2020-09-11 ENCOUNTER — Other Ambulatory Visit: Payer: Self-pay

## 2020-09-11 ENCOUNTER — Ambulatory Visit (INDEPENDENT_AMBULATORY_CARE_PROVIDER_SITE_OTHER): Payer: Medicare Other | Admitting: Registered Nurse

## 2020-09-11 VITALS — BP 108/66 | HR 106 | Temp 98.0°F | Resp 16 | Ht 70.0 in | Wt 150.0 lb

## 2020-09-11 DIAGNOSIS — R6889 Other general symptoms and signs: Secondary | ICD-10-CM

## 2020-09-11 DIAGNOSIS — Z87891 Personal history of nicotine dependence: Secondary | ICD-10-CM

## 2020-09-11 DIAGNOSIS — Z8669 Personal history of other diseases of the nervous system and sense organs: Secondary | ICD-10-CM | POA: Diagnosis not present

## 2020-09-11 DIAGNOSIS — R4189 Other symptoms and signs involving cognitive functions and awareness: Secondary | ICD-10-CM

## 2020-09-11 DIAGNOSIS — R1114 Bilious vomiting: Secondary | ICD-10-CM

## 2020-09-11 DIAGNOSIS — R634 Abnormal weight loss: Secondary | ICD-10-CM

## 2020-09-11 DIAGNOSIS — R112 Nausea with vomiting, unspecified: Secondary | ICD-10-CM

## 2020-09-11 DIAGNOSIS — R0600 Dyspnea, unspecified: Secondary | ICD-10-CM | POA: Diagnosis not present

## 2020-09-11 DIAGNOSIS — R197 Diarrhea, unspecified: Secondary | ICD-10-CM | POA: Diagnosis not present

## 2020-09-11 DIAGNOSIS — G4452 New daily persistent headache (NDPH): Secondary | ICD-10-CM

## 2020-09-11 DIAGNOSIS — R4689 Other symptoms and signs involving appearance and behavior: Secondary | ICD-10-CM

## 2020-09-11 DIAGNOSIS — R42 Dizziness and giddiness: Secondary | ICD-10-CM | POA: Diagnosis not present

## 2020-09-11 DIAGNOSIS — K529 Noninfective gastroenteritis and colitis, unspecified: Secondary | ICD-10-CM

## 2020-09-11 DIAGNOSIS — R0609 Other forms of dyspnea: Secondary | ICD-10-CM

## 2020-09-11 LAB — COMPREHENSIVE METABOLIC PANEL
ALT: 16 U/L (ref 0–53)
AST: 16 U/L (ref 0–37)
Albumin: 3.7 g/dL (ref 3.5–5.2)
Alkaline Phosphatase: 66 U/L (ref 39–117)
BUN: 18 mg/dL (ref 6–23)
CO2: 30 mEq/L (ref 19–32)
Calcium: 9 mg/dL (ref 8.4–10.5)
Chloride: 103 mEq/L (ref 96–112)
Creatinine, Ser: 1.09 mg/dL (ref 0.40–1.50)
GFR: 68.92 mL/min (ref >60.00)
Glucose, Bld: 84 mg/dL (ref 70–99)
Potassium: 3.4 mEq/L — ABNORMAL LOW (ref 3.5–5.1)
Sodium: 141 mEq/L (ref 135–145)
Total Bilirubin: 0.6 mg/dL (ref 0.2–1.2)
Total Protein: 6.2 g/dL (ref 6.0–8.3)

## 2020-09-11 LAB — CBC WITH DIFFERENTIAL/PLATELET
Basophils Absolute: 0 10*3/uL (ref 0.0–0.1)
Basophils Relative: 0.5 % (ref 0.0–3.0)
Eosinophils Absolute: 0 10*3/uL (ref 0.0–0.7)
Eosinophils Relative: 0.4 % (ref 0.0–5.0)
HCT: 40.7 % (ref 39.0–52.0)
Hemoglobin: 13.9 g/dL (ref 13.0–17.0)
Lymphocytes Relative: 10.7 % — ABNORMAL LOW (ref 12.0–46.0)
Lymphs Abs: 0.9 10*3/uL (ref 0.7–4.0)
MCHC: 34.1 g/dL (ref 30.0–36.0)
MCV: 89.3 fl (ref 78.0–100.0)
Monocytes Absolute: 0.6 10*3/uL (ref 0.1–1.0)
Monocytes Relative: 6.8 % (ref 3.0–12.0)
Neutro Abs: 7.2 10*3/uL (ref 1.4–7.7)
Neutrophils Relative %: 81.6 % — ABNORMAL HIGH (ref 43.0–77.0)
Platelets: 207 10*3/uL (ref 150.0–400.0)
RBC: 4.57 Mil/uL (ref 4.22–5.81)
RDW: 14.4 % (ref 11.5–15.5)
WBC: 8.8 10*3/uL (ref 4.0–10.5)

## 2020-09-11 LAB — URINALYSIS
Hgb urine dipstick: NEGATIVE
Leukocytes,Ua: NEGATIVE
Nitrite: NEGATIVE
Specific Gravity, Urine: >=1.03 — AB (ref 1.000–1.030)
Urine Glucose: NEGATIVE
Urobilinogen, UA: 0.2 (ref 0.0–1.0)
pH: 5.5 (ref 5.0–8.0)

## 2020-09-11 LAB — BRAIN NATRIURETIC PEPTIDE: Pro B Natriuretic peptide (BNP): 41 pg/mL (ref 0.0–100.0)

## 2020-09-11 LAB — AMMONIA: Ammonia: 33 umol/L (ref 11–35)

## 2020-09-11 IMAGING — CT CT HEAD W/O CM
3 of 4 series · 13 of 47 positions shown, 15 images · non-contrast
Comparison: [DATE]

CLINICAL DATA: Headache, chronic

EXAM:
CT HEAD WITHOUT CONTRAST
TECHNIQUE: Contiguous axial images were obtained from the base of the skull
through the vertex without intravenous contrast.

[Series 2: head wo ax · axial · 0.34mm/px · z∈[-222,-92]mm · 7 of 36 slices shown, 9 images]
[im 5/36  brain]
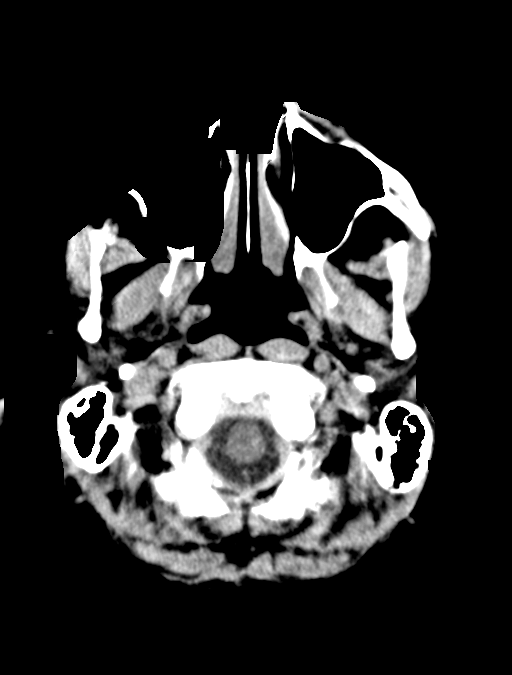
[im 5/36  bone]
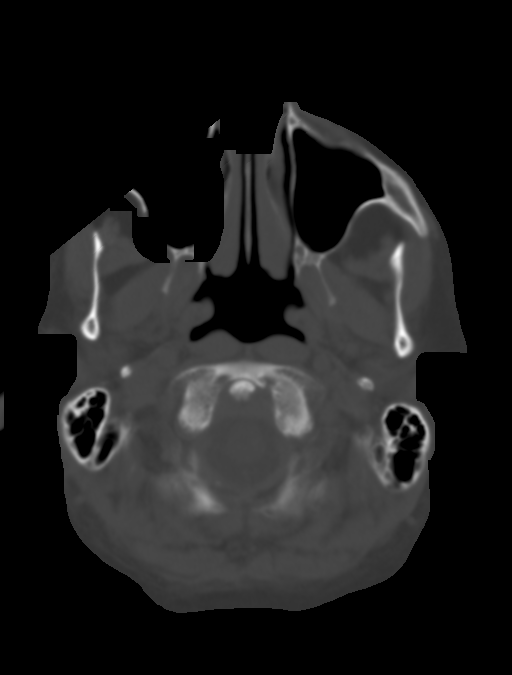
[im 9/36  brain]
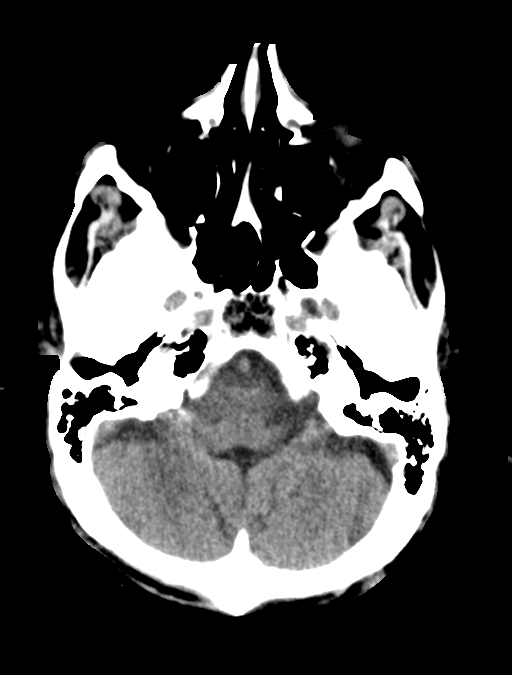
[im 14/36  brain]
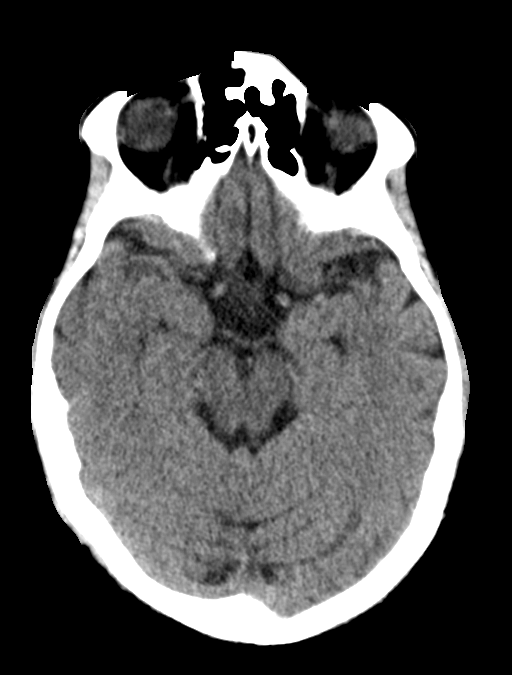
[im 18/36  brain]
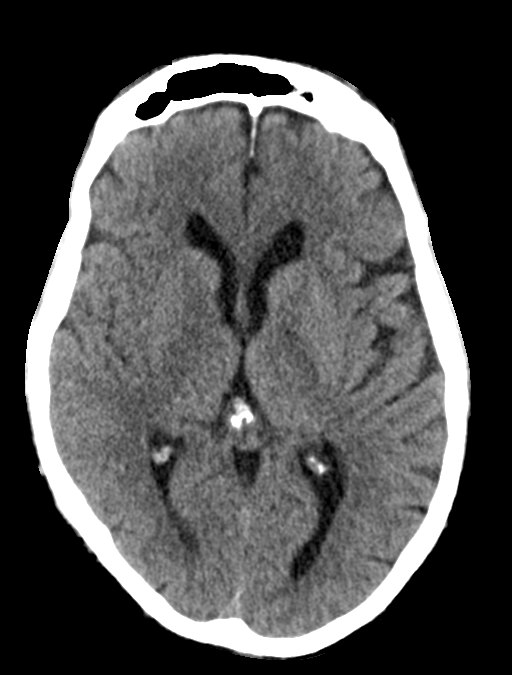
[im 22/36  brain]
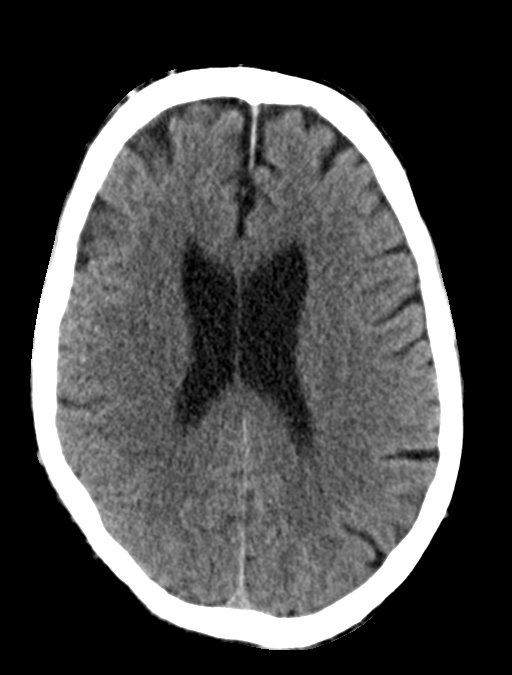
[im 22/36  bone]
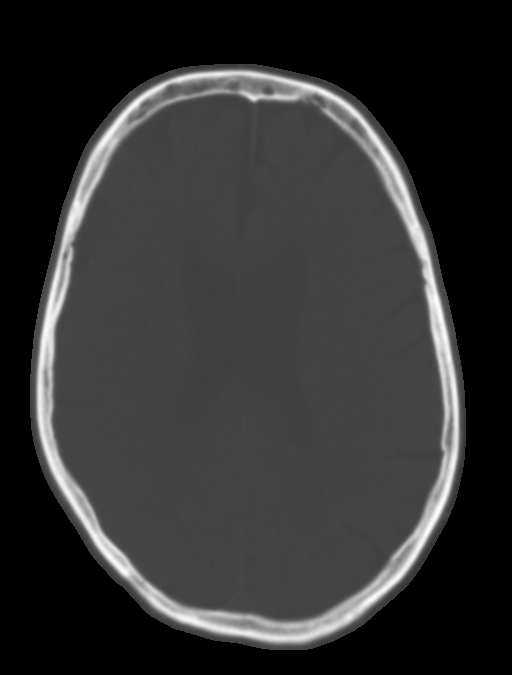
[im 27/36  brain]
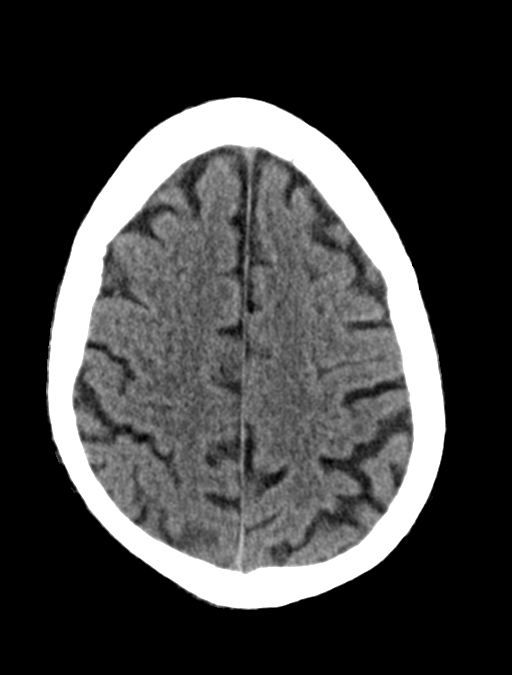
[im 31/36  brain]
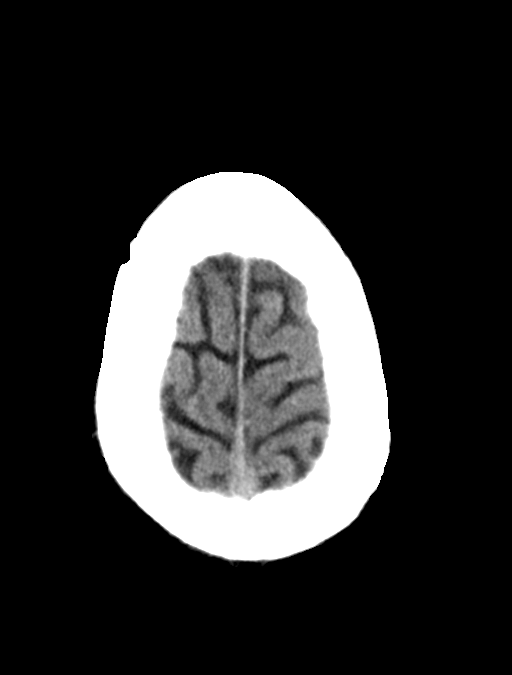

[Series 4: coronal soft · coronal · 0.35mm/px · 3 of 75 slices shown]
[im 25/75  brain]
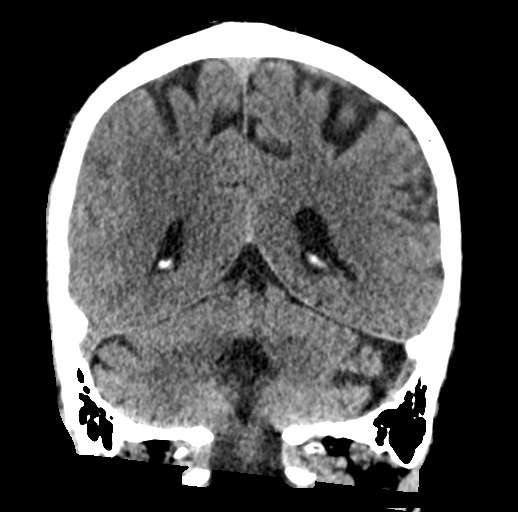
[im 33/75  brain]
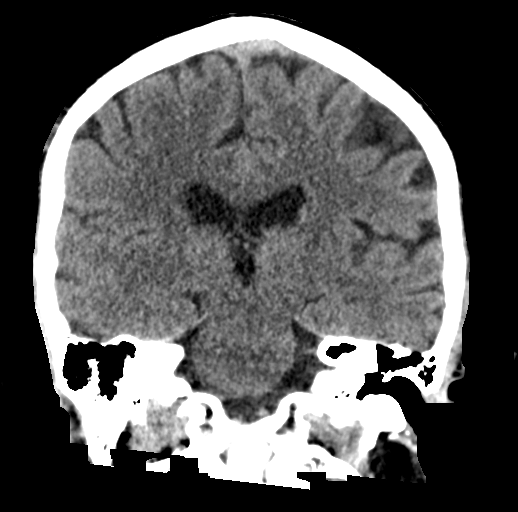
[im 42/75  brain]
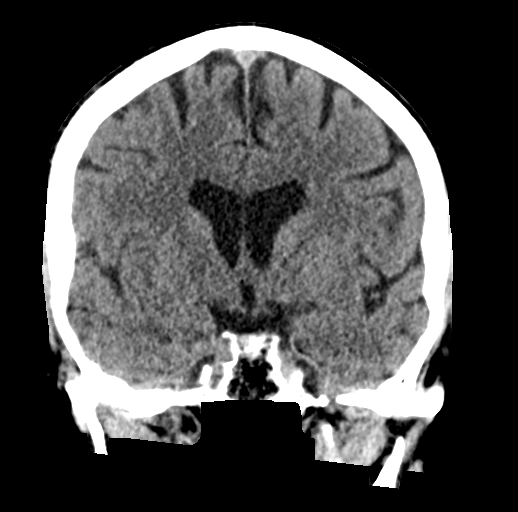

[Series 5: sagittal soft · sagittal · 0.35mm/px · 3 of 58 slices shown]
[im 20/58  brain]
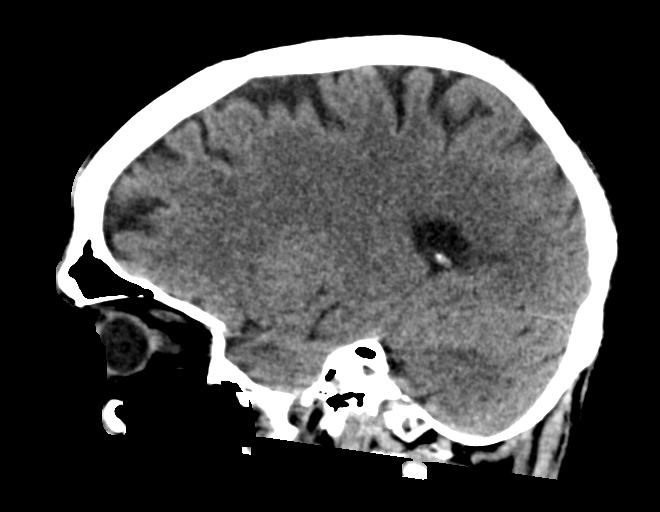
[im 29/58  brain]
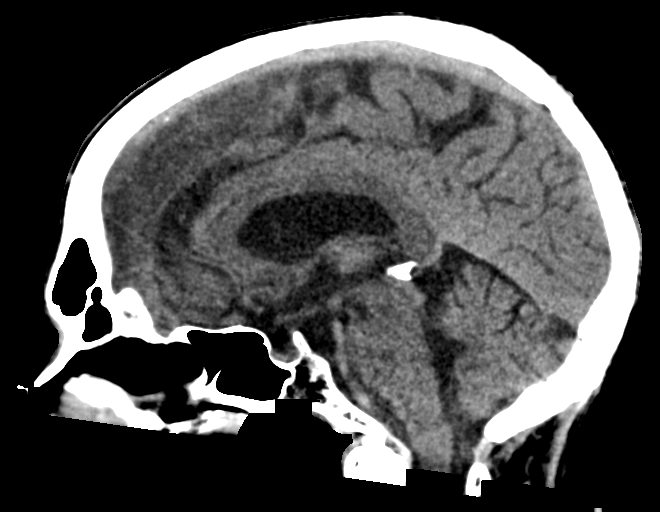
[im 39/58  brain]
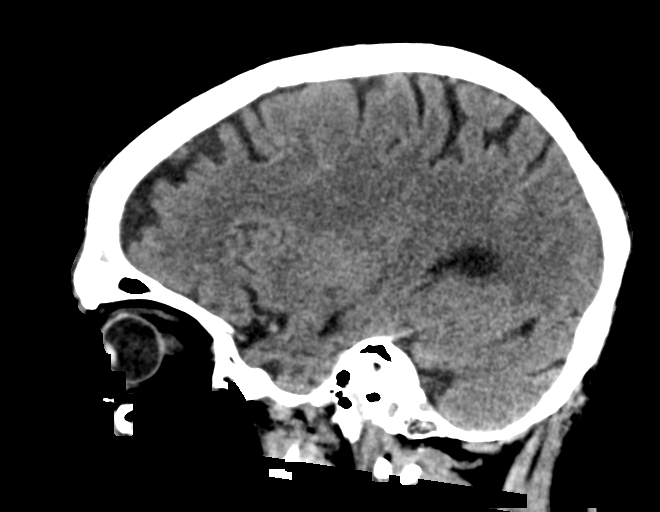

[13 of 47 positions shown; findings below may reference images not displayed]

FINDINGS: Brain: No evidence of acute infarction, hemorrhage, hydrocephalus,
extra-axial collection or mass lesion/mass effect.

Vascular: No hyperdense vessel or unexpected calcification.

Skull: Normal. Negative for fracture or focal lesion.

Sinuses/Orbits: No acute finding.

Other: None.
IMPRESSION: No acute intracranial pathology. No non-contrast CT findings to
explain headache.

## 2020-09-11 IMAGING — CT CT CHEST LUNG CANCER SCREENING LOW DOSE W/O CM
3 of 4 series · 16 of 40 positions shown, 18 images · non-contrast
Comparison: Chest radiograph [DATE].  No prior CT.

CLINICAL DATA: Twenty pack-year smoking history, quitting 29 years
ago. History of hepatic encephalopathy.

EXAM:
CT CHEST WITHOUT CONTRAST LOW-DOSE FOR LUNG CANCER SCREENING
TECHNIQUE: Multidetector CT imaging of the chest was performed following the
standard protocol without IV contrast.

[Series 3: ldct soft tissue · axial · 0.73mm/px · z∈[-584,-328]mm · 8 of 61 slices shown]
[im 5/61  mediastinal]
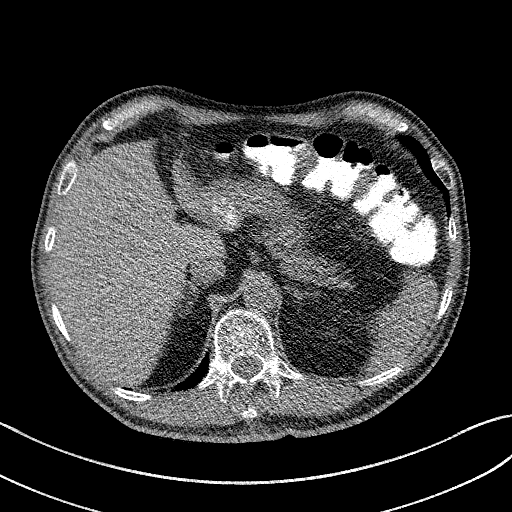
[im 12/61  mediastinal]
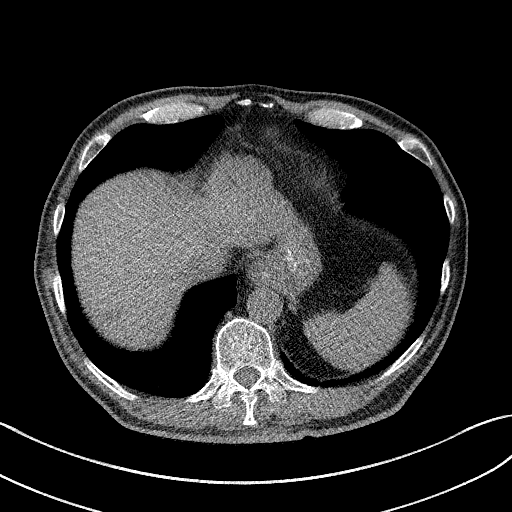
[im 19/61  mediastinal]
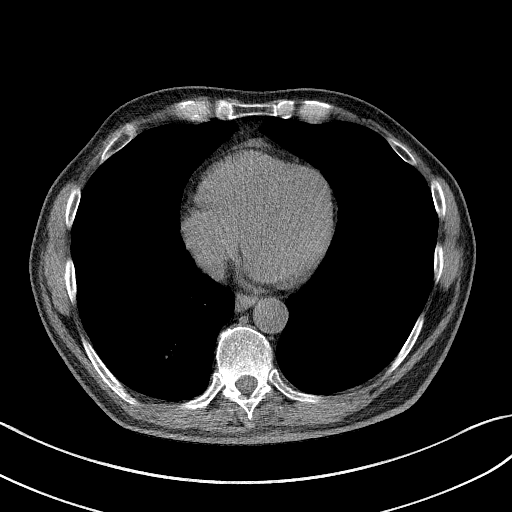
[im 26/61  mediastinal]
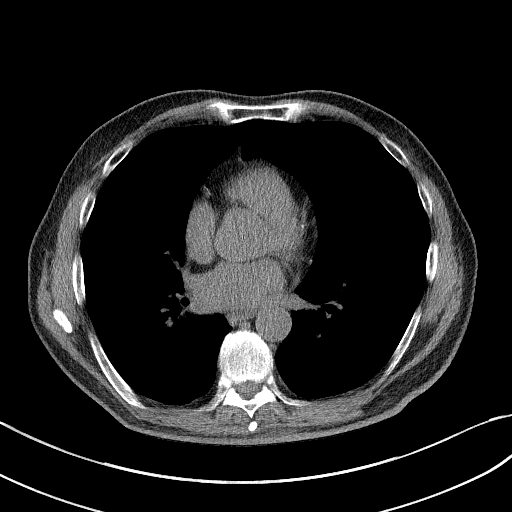
[im 35/61  mediastinal]
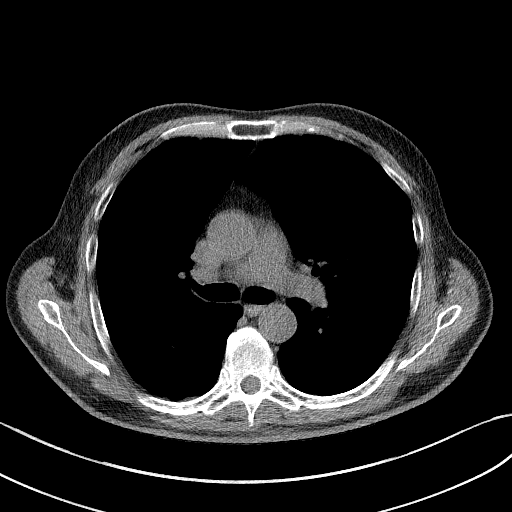
[im 42/61  mediastinal]
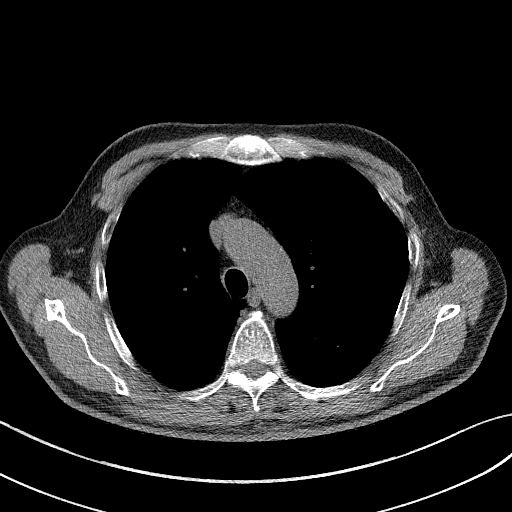
[im 49/61  mediastinal]
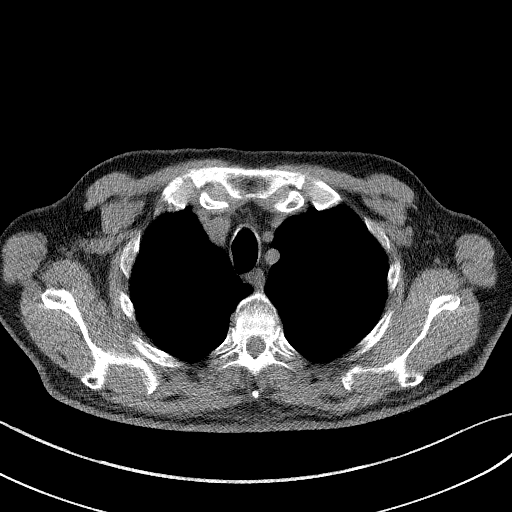
[im 56/61  mediastinal]
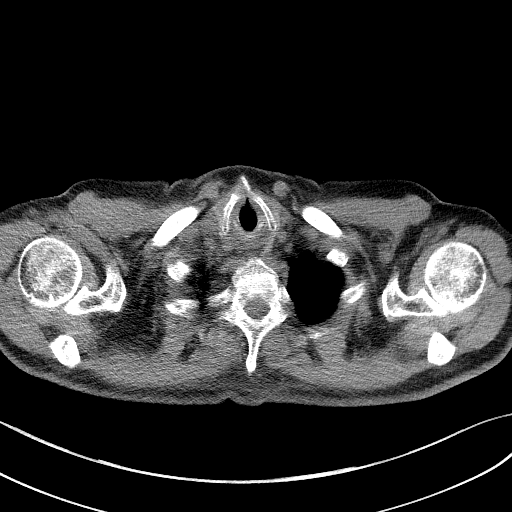

[Series 5: coronal soft · coronal · 0.62mm/px · 3 of 267 slices shown]
[im 54/267  lung]
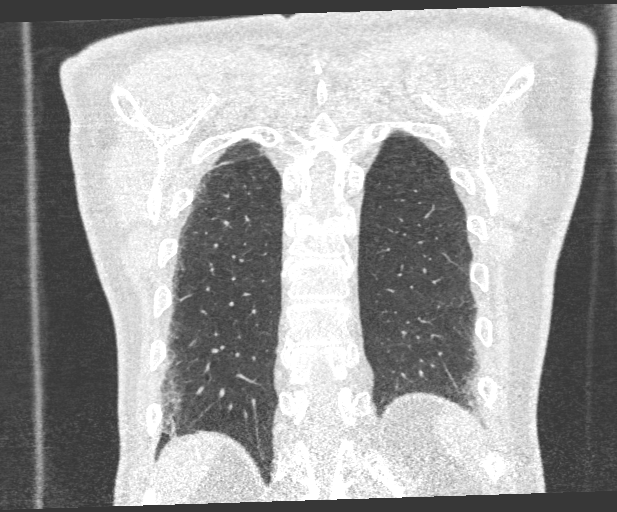
[im 107/267  lung]
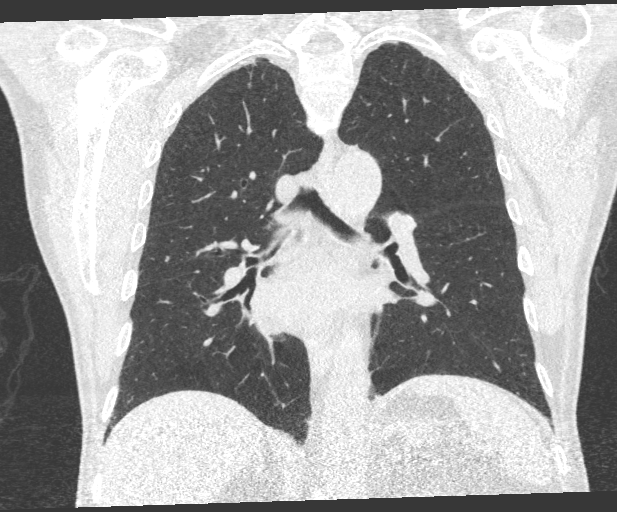
[im 160/267  lung]
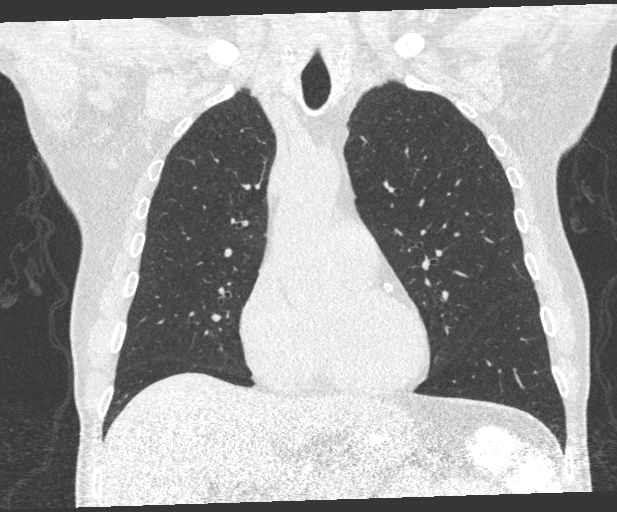

[ct lung segmentation data · axial · 0.73mm/px · z∈[-604,-604]mm · 5 of 302 frames shown]
[frame 1/302  mediastinal]
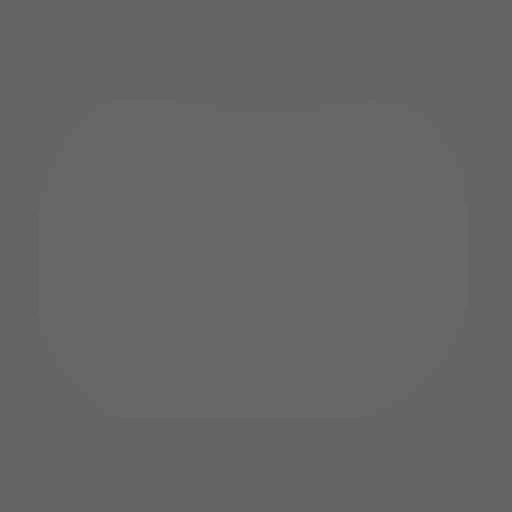
[frame 1/302  lung]
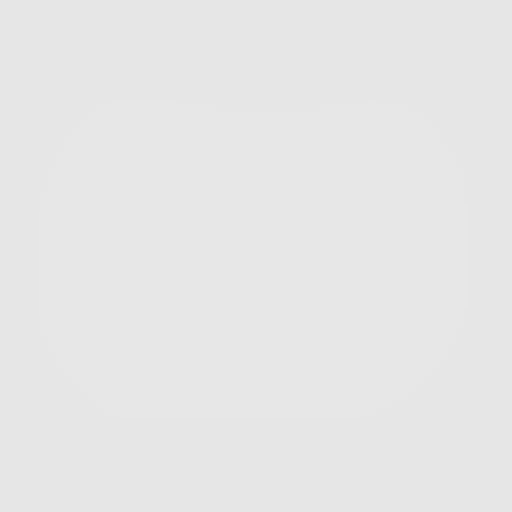
[frame 34/302  lung]
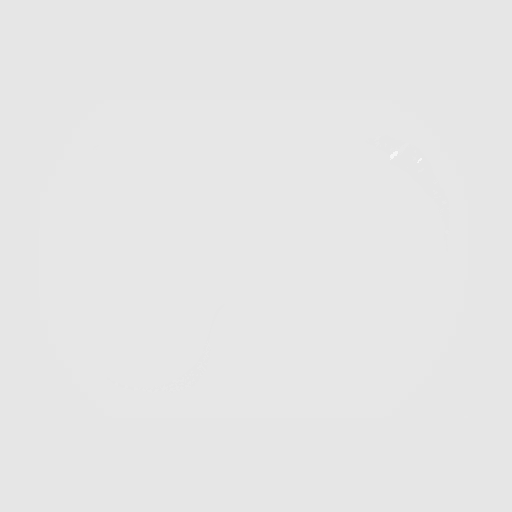
[frame 67/302  lung]
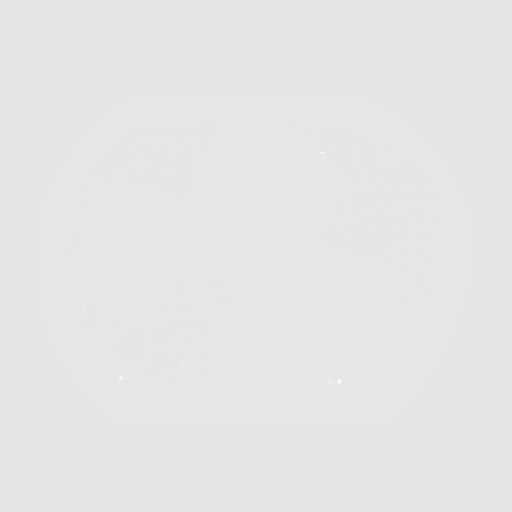
[frame 101/302  lung]
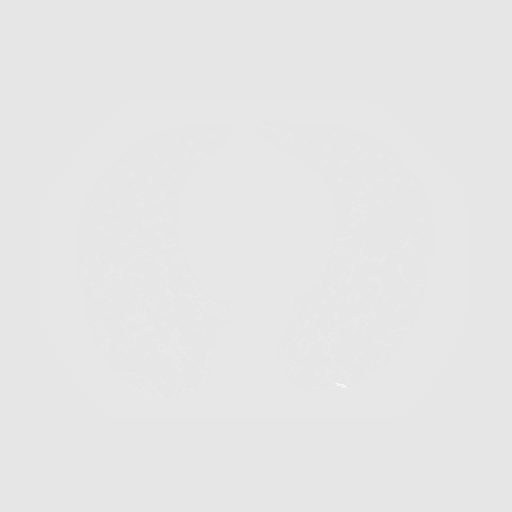
[frame 134/302  mediastinal]
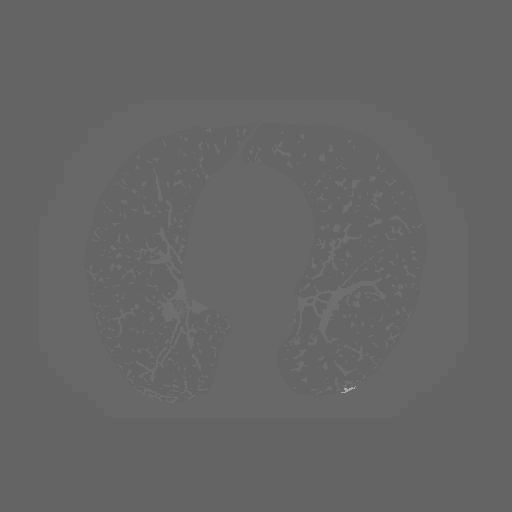
[frame 134/302  lung]
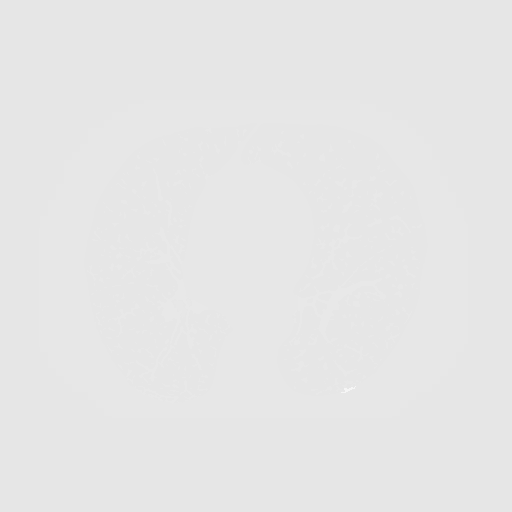

[16 of 40 positions shown; findings below may reference images not displayed]

FINDINGS: Cardiovascular: Aortic atherosclerosis. Borderline cardiomegaly.
Multivessel coronary artery atherosclerosis.

Mediastinum/Nodes: No mediastinal or definite hilar adenopathy,
given limitations of unenhanced CT.

Lungs/Pleura: No pleural fluid.  Clear lungs.

Upper Abdomen: Normal imaged portions of the liver, spleen, stomach,
pancreas, adrenal glands, gallbladder, left kidney.

Musculoskeletal: Mild osteopenia.
IMPRESSION: 1. Lung-RADS 1, negative. Please note that per the submitted
clinical history, the patient does not qualify for lung cancer
screening exam. Guidelines are as follows.: Low-dose CT lung cancer
screening is recommended for patients who are 50-80 years of age
with a 20+ pack-year history of smoking, and who are currently
smoking or quit <=15 years ago.
2. Coronary artery atherosclerosis. Aortic Atherosclerosis
([F4]-[F4]).

## 2020-09-11 IMAGING — CT CT ABD-PELV W/ CM
2 of 5 series · 15 of 46 positions shown, 17 images · IV contrast (APPLIED)
Comparison: None.

CLINICAL DATA: Nausea, vomiting, weight loss, concern for neoplasm,
diffuse abdominal tenderness and guarding on exam

EXAM:
CT ABDOMEN AND PELVIS WITH CONTRAST
TECHNIQUE: Multidetector CT imaging of the abdomen and pelvis was performed
using the standard protocol following bolus administration of
intravenous contrast.
CONTRAST:  80mL OMNIPAQUE IOHEXOL 300 MG/ML  SOLN

[Series 4: abd pel w · axial · 0.72mm/px · z∈[-960,-535]mm · 12 of 96 slices shown, 14 images]
[im 6/96  soft-tissue]
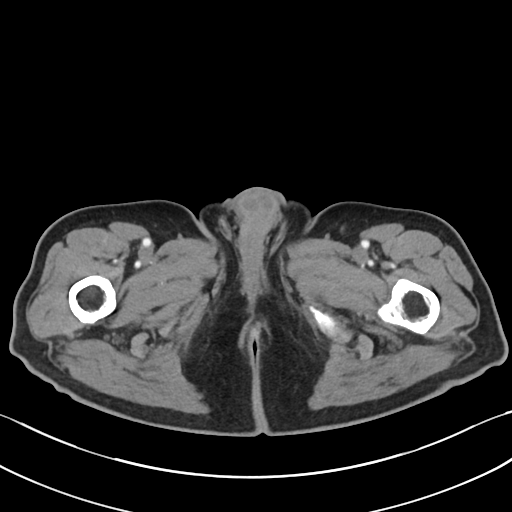
[im 6/96  bone]
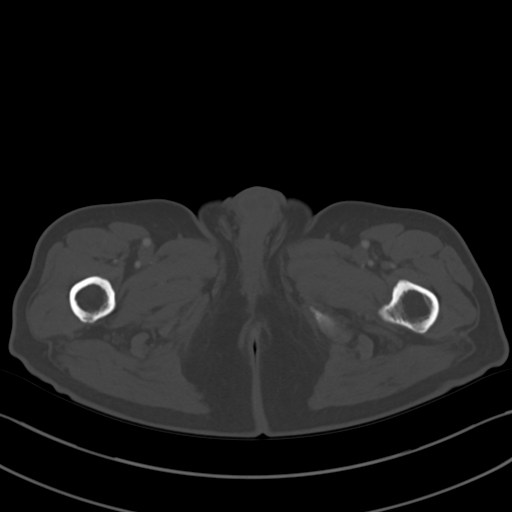
[im 16/96  soft-tissue]
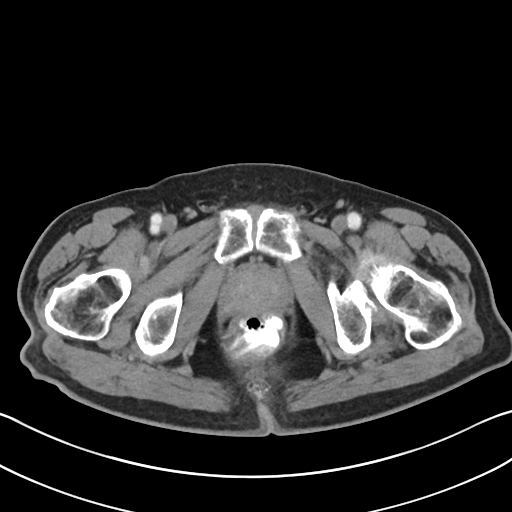
[im 21/96  soft-tissue]
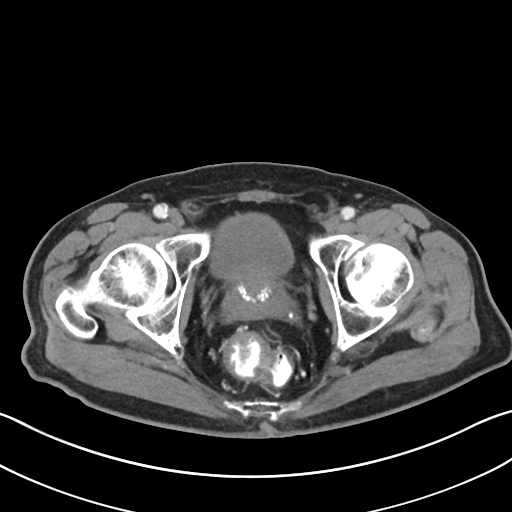
[im 31/96  soft-tissue]
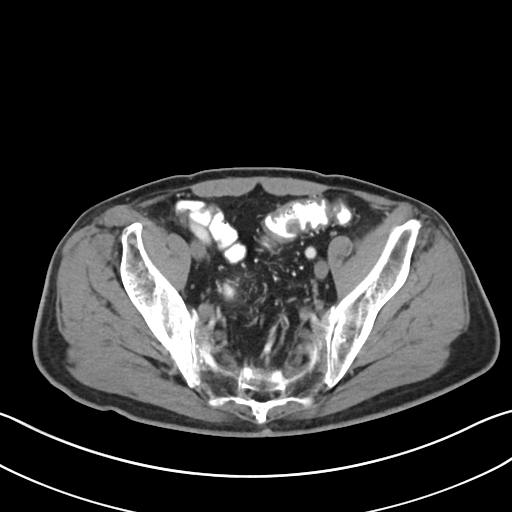
[im 36/96  soft-tissue]
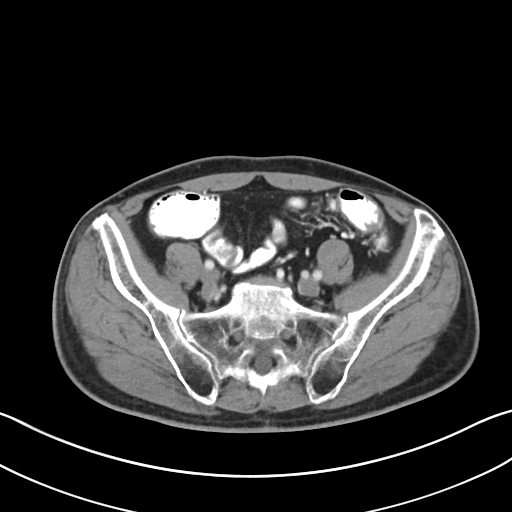
[im 46/96  soft-tissue]
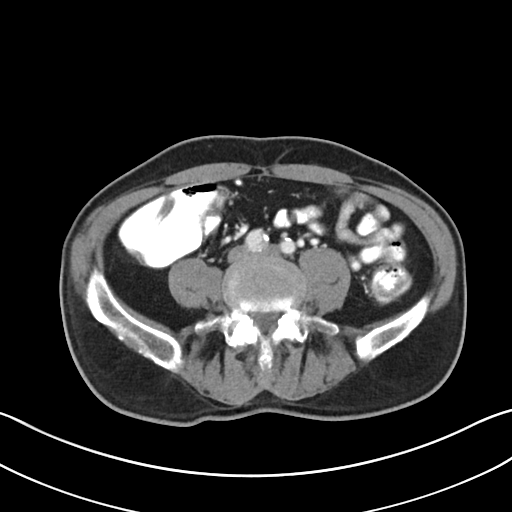
[im 51/96  soft-tissue]
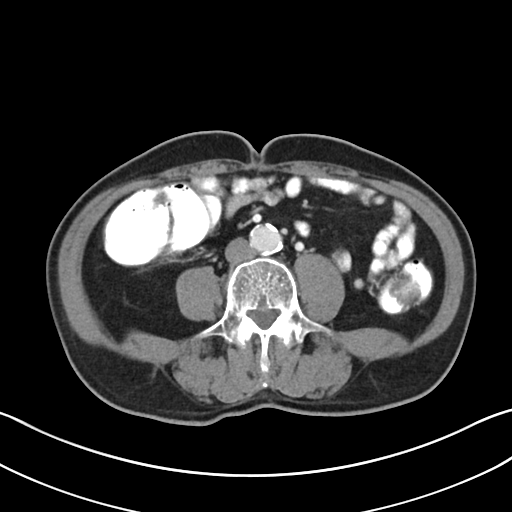
[im 61/96  soft-tissue]
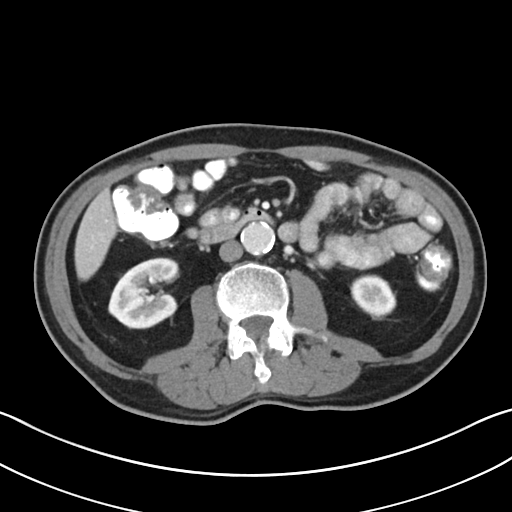
[im 66/96  soft-tissue]
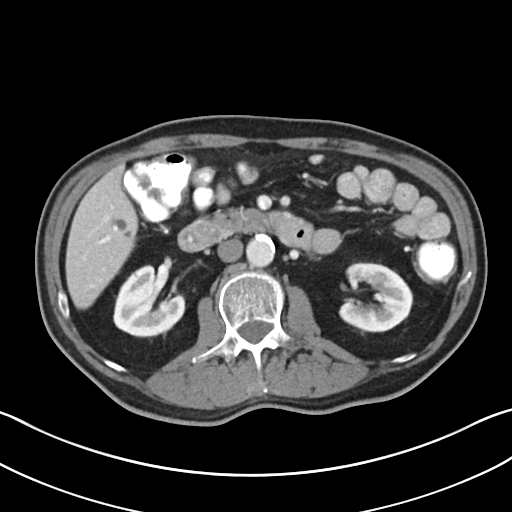
[im 66/96  bone]
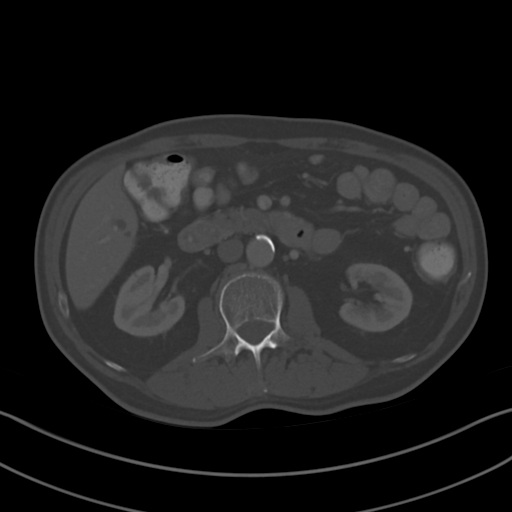
[im 76/96  soft-tissue]
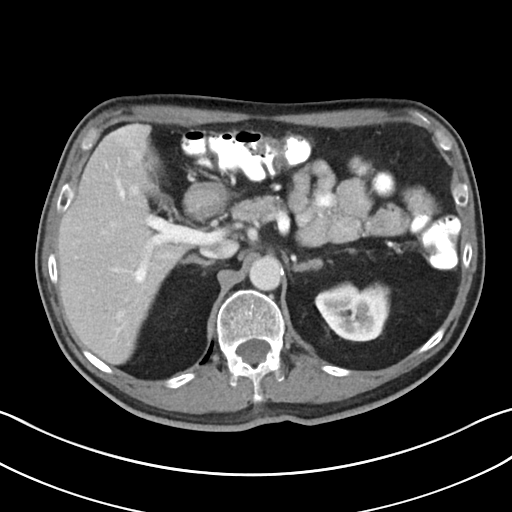
[im 81/96  soft-tissue]
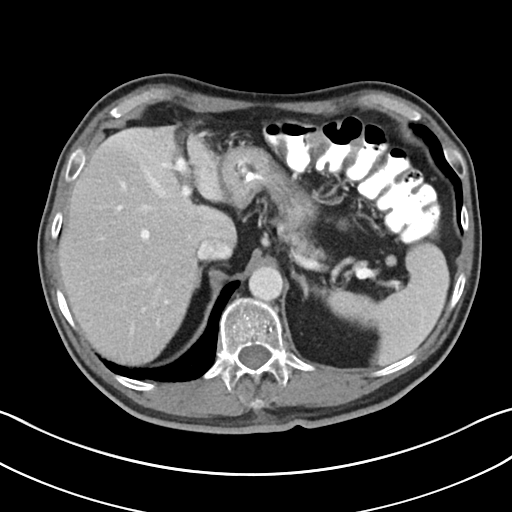
[im 91/96  soft-tissue]
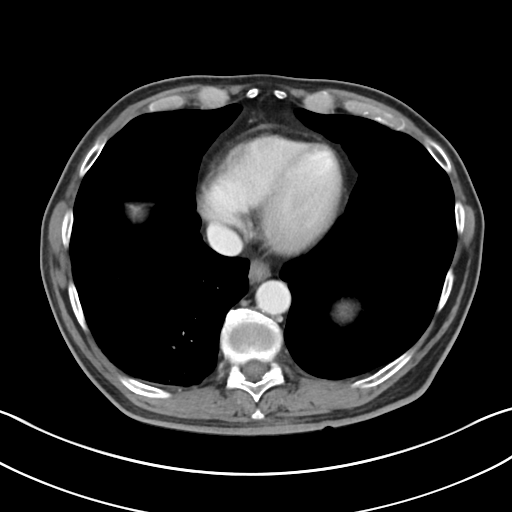

[Series 7: coronal · coronal · 0.71mm/px · 3 of 88 slices shown]
[im 30/88  soft-tissue]
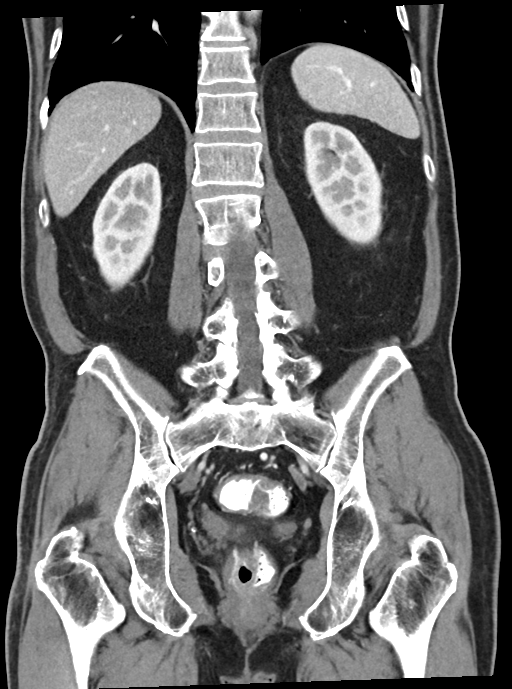
[im 39/88  soft-tissue]
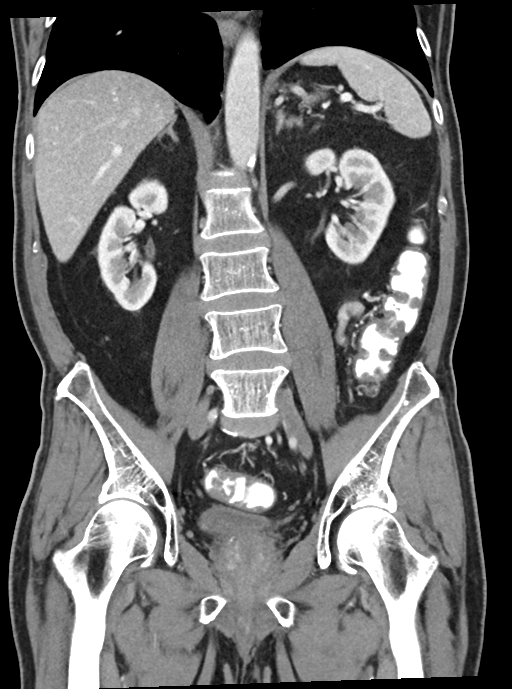
[im 49/88  soft-tissue]
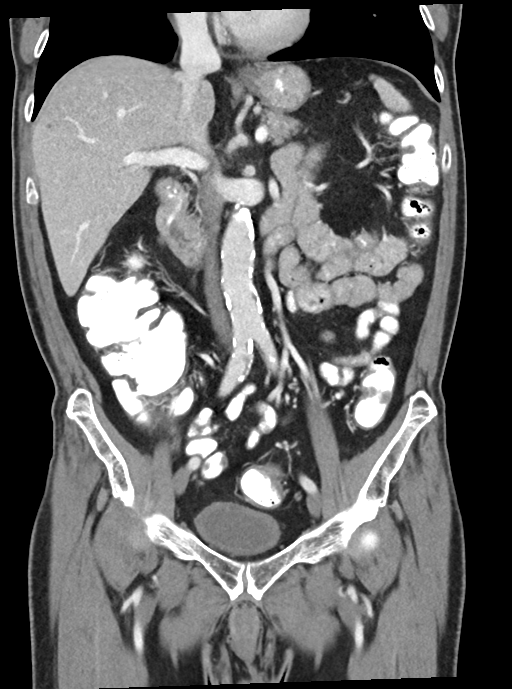

[15 of 46 positions shown; findings below may reference images not displayed]

FINDINGS: Lower chest: No acute abnormality.

Hepatobiliary: Unusual, heterogeneously enhancing lesion of the
atrophic appearing left lobe of the liver, measuring approximately
6.6 x 3.4 x 2.4 cm (series 4, image 10, series 7, image 62). Certain
regions of this lesion appear to have peripheral nodular
enhancement. Multiple additional small fluid attenuation cysts or
hemangiomata throughout the liver. No gallstones, gallbladder wall
thickening, or biliary dilatation.

Pancreas: Unremarkable. No pancreatic ductal dilatation or
surrounding inflammatory changes.

Spleen: Normal in size without significant abnormality.

Adrenals/Urinary Tract: Adrenal glands are unremarkable. Kidneys are
normal, without renal calculi, solid lesion, or hydronephrosis.
Bladder is unremarkable.

Stomach/Bowel: Stomach is within normal limits. Appendix appears
normal. No evidence of bowel wall thickening, distention, or
inflammatory changes.

Vascular/Lymphatic: Aortic atherosclerosis. No enlarged abdominal or
pelvic lymph nodes.

Reproductive: Mild prostatomegaly.

Other: No abdominal wall hernia or abnormality. No abdominopelvic
ascites.

Musculoskeletal: No acute or significant osseous findings.
IMPRESSION: 1. No acute CT findings of the abdomen or pelvis to explain nausea,
vomiting, or weight loss.
2. Unusual, heterogeneously enhancing lesion of the atrophic
appearing left lobe of the liver, measuring approximately 6.6 x
x 2.4 cm. Certain regions of this lesion appear to have peripheral
nodular enhancement. This appearance is suggestive of a sizable
hepatic hemangioma, although incompletely characterized. Recommend
multiphasic contrast enhanced MRI to further characterize.
3. Mild prostatomegaly.

Aortic Atherosclerosis ([X7]-[X7]).

## 2020-09-12 ENCOUNTER — Ambulatory Visit (HOSPITAL_BASED_OUTPATIENT_CLINIC_OR_DEPARTMENT_OTHER)
Admission: RE | Admit: 2020-09-12 | Discharge: 2020-09-12 | Disposition: A | Discharge status: Home / Self Care | Payer: Medicare Other | Source: Ambulatory Visit | Attending: Registered Nurse | Admitting: Registered Nurse

## 2020-09-12 ENCOUNTER — Encounter (HOSPITAL_BASED_OUTPATIENT_CLINIC_OR_DEPARTMENT_OTHER): Payer: Self-pay

## 2020-09-12 DIAGNOSIS — R1114 Bilious vomiting: Secondary | ICD-10-CM | POA: Diagnosis present

## 2020-09-12 DIAGNOSIS — Z87891 Personal history of nicotine dependence: Secondary | ICD-10-CM | POA: Insufficient documentation

## 2020-09-12 DIAGNOSIS — G4452 New daily persistent headache (NDPH): Secondary | ICD-10-CM | POA: Diagnosis present

## 2020-09-12 DIAGNOSIS — R42 Dizziness and giddiness: Secondary | ICD-10-CM | POA: Insufficient documentation

## 2020-09-12 DIAGNOSIS — R4689 Other symptoms and signs involving appearance and behavior: Secondary | ICD-10-CM | POA: Insufficient documentation

## 2020-09-12 DIAGNOSIS — R634 Abnormal weight loss: Secondary | ICD-10-CM | POA: Insufficient documentation

## 2020-09-12 DIAGNOSIS — Z8669 Personal history of other diseases of the nervous system and sense organs: Secondary | ICD-10-CM | POA: Diagnosis present

## 2020-09-12 DIAGNOSIS — R4189 Other symptoms and signs involving cognitive functions and awareness: Secondary | ICD-10-CM | POA: Diagnosis present

## 2020-09-12 LAB — URINE DRUGS OF ABUSE SCREEN W ALC, ROUTINE (REF LAB)
Amphetamines, Urine: NEGATIVE ng/mL
Barbiturate Quant, Ur: NEGATIVE ng/mL
Benzodiazepine Quant, Ur: NEGATIVE ng/mL
Cannabinoid Quant, Ur: NEGATIVE ng/mL
Cocaine (Metab.): NEGATIVE ng/mL
Ethanol, Urine: NEGATIVE %
Methadone Screen, Urine: NEGATIVE ng/mL
Opiate Quant, Ur: NEGATIVE ng/mL
PCP Quant, Ur: NEGATIVE ng/mL
Propoxyphene: NEGATIVE ng/mL

## 2020-09-12 LAB — HEPATITIS PANEL, ACUTE
Hep A IgM: NONREACTIVE
Hep B C IgM: NONREACTIVE
Hepatitis B Surface Ag: NONREACTIVE
Hepatitis C Ab: NONREACTIVE
SIGNAL TO CUT-OFF: 0 (ref <1.00)

## 2020-09-12 MED ORDER — IOHEXOL 300 MG/ML  SOLN
80.0000 mL | Freq: Once | INTRAMUSCULAR | Status: AC | PRN
  Administered 2020-09-12: 80 mL via INTRAVENOUS

## 2020-09-13 ENCOUNTER — Other Ambulatory Visit: Payer: Self-pay | Admitting: Registered Nurse

## 2020-09-13 DIAGNOSIS — R160 Hepatomegaly, not elsewhere classified: Secondary | ICD-10-CM

## 2020-09-16 ENCOUNTER — Encounter: Payer: Self-pay | Admitting: Registered Nurse

## 2020-09-16 DIAGNOSIS — I700 Atherosclerosis of aorta: Secondary | ICD-10-CM | POA: Insufficient documentation

## 2020-09-16 NOTE — Telephone Encounter (Signed)
Called and spoke with patient and he will be waiting on the call for the referral.

## 2020-09-16 NOTE — Progress Notes (Signed)
Established Patient Office Visit  Subjective:  Patient ID: Nathan Hunt, male    DOB: 09/04/1950  Age: 70 y.o. MRN: [id number]  CC:  Chief Complaint  Patient presents with  . Diarrhea    Pt following up long standing diarrhea, has been going on for about 4 weeks, notes brat diet has not changed symptoms, pt was seen Monday urgent care, they did saline drip pt notes he felt better after that, did discuss hydration pt reports he has cut back on water trying to stop the diarrhea. 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 Review of chart shows attempts were made to call him but were unsuccessful  He is still experiencing generalized abdominal pain, frequent loose stools, weight loss of uncertain etiology, and occasionally lightheadedness/LOC.  No mucus or blood in stool Reports low appetite, 1-2 loose stools minimum daily. Low hydration, trying to restrict fluid intake to limit loose stools.  LOC on Sunday occurred while working outside. Apparent fall but denies injury to head or otherwise.  Past Medical History:  Diagnosis Date  . BPH (benign prostatic hyperplasia)   . Depression   . Hyperlipidemia   . Hypertension   . Insomnia   . Tendonitis   . Tinnitus     Past Surgical History:  Procedure Laterality Date  . TONSILLECTOMY  1962    Family History  Problem Relation Age of Onset  . Heart disease Mother   . Heart disease Father   . Heart disease Sister   . Bone cancer Maternal Grandfather   . Bone cancer Other   . Alzheimer's disease Other   . Colon cancer Neg Hx     Social History   Socioeconomic History  . Marital status: Single    Spouse name: Not on file  . Number of children: Not on file  . Years  of education: Not on file  . Highest education level: Not on file  Occupational History  . Not on file  Tobacco Use  . Smoking status: Former Smoker    Types: Cigarettes  . Smokeless tobacco: Never Used  Vaping Use  . Vaping Use: Never used  Substance and Sexual Activity  . Alcohol use: Yes    Alcohol/week: 0.0 standard drinks  . Drug use: No  . Sexual activity: Not on file  Other Topics Concern  . Not on file  Social History Narrative  . Not on file   Social Determinants of Health   Financial Resource Strain: Not on file  Food Insecurity: Not on file  Transportation Needs: Not on file  Physical Activity: Not on file  Stress: Not on file  Social Connections: Not on file  Intimate Partner Violence: Not on file    Outpatient Medications Prior to Visit  Medication Sig Dispense Refill  . cetirizine (ZYRTEC) 5 MG tablet Take 5 mg by mouth daily.    . ondansetron (ZOFRAN) 4 MG tablet Take 1 tablet (4 mg total) by mouth 2 (two) times daily as needed for nausea (diarrhea, upset stomach). 20 tablet 0  . traZODone (DESYREL) 50 MG tablet Take 0.5-1 tablets (25-50 mg total) by mouth at bedtime as needed for sleep. 90 tablet 0  . atorvastatin (LIPITOR)  20 MG tablet Take 1 tablet (20 mg total) by mouth daily. NO MORE REFILLS WITHOUT OFFICE VISIT - 2ND NOTICE 90 tablet 3  . escitalopram (LEXAPRO) 10 MG tablet Take 10 mg by mouth daily. (Patient not taking: Reported on 08/13/2020)     No facility-administered medications prior to visit.    No Known Allergies  ROS Review of Systems  Constitutional: Negative.   HENT: Negative.   Eyes: Negative.   Respiratory: Negative.   Cardiovascular: Negative.   Gastrointestinal: Negative.   Genitourinary: Negative.   Musculoskeletal: Negative.   Skin: Negative.   Neurological: Negative.   Psychiatric/Behavioral: Negative.   All other systems reviewed and are negative.     Objective:    Physical Exam Constitutional:      General: He  is not in acute distress.    Appearance: Normal appearance. He is normal weight. He is not ill-appearing, toxic-appearing or diaphoretic.  Cardiovascular:     Rate and Rhythm: Normal rate and regular rhythm.     Heart sounds: Normal heart sounds. No murmur heard. No friction rub. No gallop.   Pulmonary:     Effort: Pulmonary effort is normal. No respiratory distress.     Breath sounds: Normal breath sounds. No stridor. No wheezing, rhonchi or rales.  Chest:     Chest wall: No tenderness.  Neurological:     General: No focal deficit present.     Mental Status: He is alert and oriented to person, place, and time. Mental status is at baseline.  Psychiatric:        Mood and Affect: Mood normal.        Behavior: Behavior normal.        Thought Content: Thought content normal.        Judgment: Judgment normal.     BP 108/66   Pulse (!) 106   Temp 98 F (36.7 C) (Temporal)   Resp 16   Ht 5\' 10"  (1.778 m)   Wt 150 lb (68 kg)   SpO2 96%   BMI 21.52 kg/m  Wt Readings from Last 3 Encounters:  09/12/20 150 lb (68 kg)  09/11/20 150 lb (68 kg)  08/13/20 157 lb 3.2 oz (71.3 kg)     Health Maintenance Due  Topic Date Due  . COVID-19 Vaccine (3 - Booster) 01/26/2020    There are no preventive care reminders to display for this patient.  Lab Results  Component Value Date   TSH 2.390 07/16/2020   Lab Results  Component Value Date   WBC 8.8 09/11/2020   HGB 13.9 09/11/2020   HCT 40.7 09/11/2020   MCV 89.3 09/11/2020   PLT 207.0 09/11/2020   Lab Results  Component Value Date   NA 141 09/11/2020   K 3.4 (L) 09/11/2020   CO2 30 09/11/2020   GLUCOSE 84 09/11/2020   BUN 18 09/11/2020   CREATININE 1.09 09/11/2020   BILITOT 0.6 09/11/2020   ALKPHOS 66 09/11/2020   AST 16 09/11/2020   ALT 16 09/11/2020   PROT 6.2 09/11/2020   ALBUMIN 3.7 09/11/2020   CALCIUM 9.0 09/11/2020   GFR 68.92 09/11/2020   Lab Results  Component Value Date   CHOL 241 (H) 07/16/2020   Lab  Results  Component Value Date   HDL 37 (L) 07/16/2020   Lab Results  Component Value Date   LDLCALC 173 (H) 07/16/2020   Lab Results  Component Value Date   TRIG 169 (H) 07/16/2020   Lab Results  Component  Value Date   CHOLHDL 6.5 (H) 07/16/2020   Lab Results  Component Value Date   HGBA1C 5.4 07/16/2020      Assessment & Plan:   Problem List Items Addressed This Visit   None   Visit Diagnoses    Former moderate cigarette smoker (10-19 per day)    -  Primary   Relevant Orders   CT CHEST LUNG CA SCREEN LOW DOSE W/O CM (Completed)   CBC with Differential/Platelet (Completed)   Comprehensive metabolic panel (Completed)   Urinalysis (Completed)   Urine drugs of abuse scrn w alc, routine ([facility] [place] [facility]) (Completed)   Bilious vomiting with nausea       Relevant Orders   CT ABDOMEN PELVIS W CONTRAST (Completed)   CBC with Differential/Platelet (Completed)   Comprehensive metabolic panel (Completed)   Urinalysis (Completed)   Urine drugs of abuse scrn w alc, routine ([facility] [place] [facility]) (Completed)   Lightheaded       Relevant Orders   CT ABDOMEN PELVIS W CONTRAST (Completed)   CBC with Differential/Platelet (Completed)   Comprehensive metabolic panel (Completed)   B Nat Peptide (Completed)   Hepatitis, Acute (Completed)   Urinalysis (Completed)   Urine drugs of abuse scrn w alc, routine ([facility] [place] [facility]) (Completed)   History of encephalopathy       Relevant Orders   CT ABDOMEN PELVIS W CONTRAST (Completed)   CBC with Differential/Platelet (Completed)   Comprehensive metabolic panel (Completed)   Ammonia (Completed)   Hepatitis, Acute (Completed)   Urinalysis (Completed)   Urine drugs of abuse scrn w alc, routine ([facility] [place] [facility]) (Completed)   Other general symptoms and signs       Relevant Orders   CBC with Differential/Platelet (Completed)   Comprehensive metabolic panel (Completed)    Ammonia (Completed)   Urinalysis (Completed)   Urine drugs of abuse scrn w alc, routine ([facility] [place] [facility]) (Completed)   Nausea vomiting and diarrhea       Relevant Orders   CBC with Differential/Platelet (Completed)   Comprehensive metabolic panel (Completed)   Ammonia (Completed)   Urinalysis (Completed)   Urine drugs of abuse scrn w alc, routine ([facility] [place] [facility]) (Completed)   Cognitive and behavioral changes       Relevant Orders   CT ABDOMEN PELVIS W CONTRAST (Completed)   CBC with Differential/Platelet (Completed)   Comprehensive metabolic panel (Completed)   B Nat Peptide (Completed)   Urinalysis (Completed)   Urine drugs of abuse scrn w alc, routine ([facility] [place] [facility]) (Completed)   New daily persistent headache       Relevant Orders   CT HEAD WO CONTRAST (Completed)   CBC with Differential/Platelet (Completed)   Comprehensive metabolic panel (Completed)   B Nat Peptide (Completed)   Urinalysis (Completed)   Urine drugs of abuse scrn w alc, routine ([facility]) (Completed)   Excessive weight loss       Relevant Orders   CT ABDOMEN PELVIS W CONTRAST (Completed)   CBC with Differential/Platelet (Completed)   Comprehensive metabolic panel (Completed)   Hepatitis, Acute (Completed)   Urinalysis (Completed)   Urine drugs of abuse scrn w alc, routine ([facility]) (Completed)   Chronic diarrhea       Relevant Orders   CBC with Differential/Platelet (Completed)   Comprehensive metabolic panel (Completed)   Urinalysis (Completed)   Urine drugs of abuse scrn  w alc, routine ([facility] [place] [facility]) (Completed)   DOE (dyspnea on exertion)       Relevant Orders   B Nat Peptide (Completed)   Hepatitis, Acute (Completed)   Urinalysis (Completed)   Urine drugs of abuse scrn w alc, routine ([facility] [place] [facility]) (Completed)      No orders of the defined  types were placed in this encounter.   Follow-up: No follow-ups on file.   PLAN  Labs collected. Will follow up with the patient as warranted.  Ordered CT - will be done tomorrow. Discussed NPO guidelines for contrast. Given contrast. Pt voices understanding of instructions  Will follow up as warranted  Encouraged greater PO intake of fluids and nutrition. Limiting these is undoubtedly worsening his symptoms  Patient encouraged to call clinic with any questions, comments, or concerns.  I spent 60 minutes with this patient, more than 50% of which was spent counseling and/or educating.  Maximiano Coss, NP

## 2020-09-23 ENCOUNTER — Other Ambulatory Visit: Payer: Self-pay | Admitting: Registered Nurse

## 2020-09-23 DIAGNOSIS — K769 Liver disease, unspecified: Secondary | ICD-10-CM

## 2020-09-24 ENCOUNTER — Ambulatory Visit (HOSPITAL_COMMUNITY)
Admission: RE | Admit: 2020-09-24 | Discharge: 2020-09-24 | Disposition: A | Discharge status: Home / Self Care | Payer: Medicare Other | Source: Ambulatory Visit | Attending: Registered Nurse | Admitting: Registered Nurse

## 2020-09-24 ENCOUNTER — Other Ambulatory Visit: Payer: Self-pay

## 2020-09-24 DIAGNOSIS — K769 Liver disease, unspecified: Secondary | ICD-10-CM | POA: Diagnosis present

## 2020-09-24 IMAGING — MR MR ABDOMEN WO/W CM
18 series · 48 of 48 positions shown · IV contrast (gadavist)
Comparison: CT on [DATE]

CLINICAL DATA: Indeterminate liver mass on recent CT.

EXAM:
MRI ABDOMEN WITHOUT AND WITH CONTRAST
TECHNIQUE: Multiplanar multisequence MR imaging of the abdomen was performed
both before and after the administration of intravenous contrast.
CONTRAST:  7mL GADAVIST GADOBUTROL 1 MMOL/ML IV SOLN

[Series 4: cor haste · coronal · 6.0mm · 1.19mm/px · 2 of 33 slices shown]
[im 1/33]
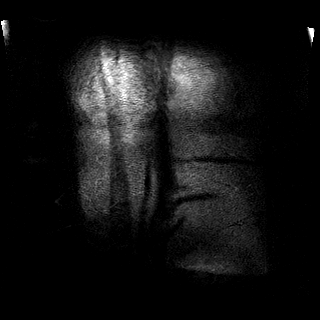
[im 33/33]
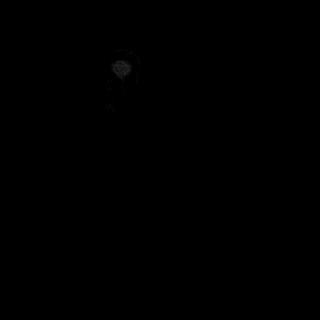

[Series 5: ax haste · axial · 6.0mm · 1.19mm/px · z∈[-76,+182]mm · 2 of 37 slices shown]
[im 1/37]
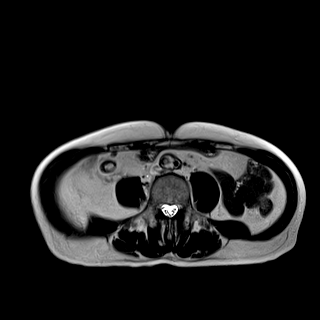
[im 37/37]
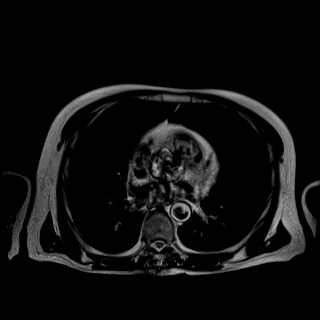

[Series 8: T2 fat-sat · axial · 6.0mm · 1.19mm/px · z∈[-76,+182]mm · 2 of 37 slices shown]
[im 1/37]
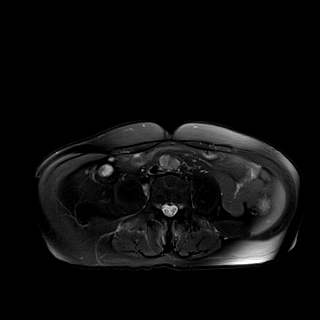
[im 37/37]
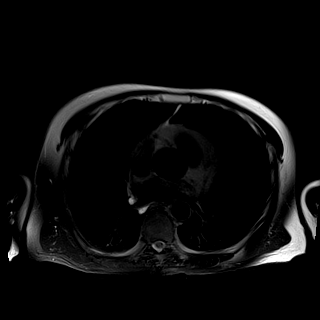

[Series 9: t1_vibe_opp-in_tra_p4_bh · axial · 3.0mm · 1.19mm/px · z∈[-95,+165]mm · 3 of 88 slices shown (1 of 2)]
[im 1/88]
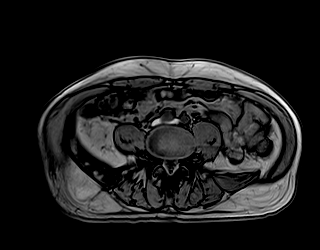
[im 44/88]
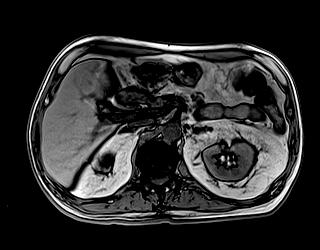
[im 88/88]
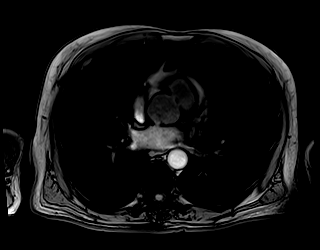

[Series 9: t1_vibe_opp-in_tra_p4_bh · axial · 3.0mm · 1.19mm/px · z∈[-95,+165]mm · 3 of 88 slices shown (2 of 2)]
[im 1/88]
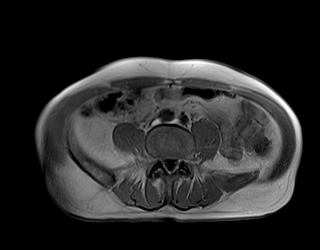
[im 44/88]
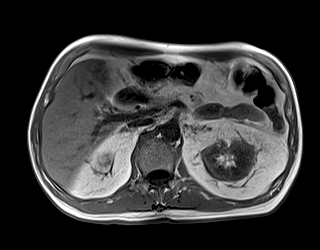
[im 88/88]
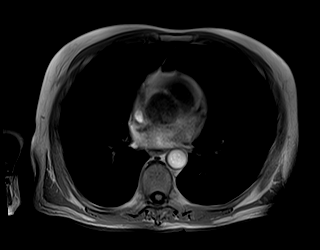

[Series 10: DWI · axial · 6.0mm · 1.42mm/px · z∈[-85,+174]mm · 4 of 111 slices shown (1 of 2)]
[im 1/111]
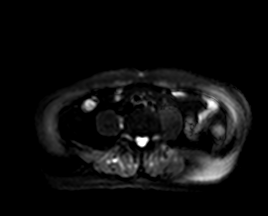
[im 37/111]
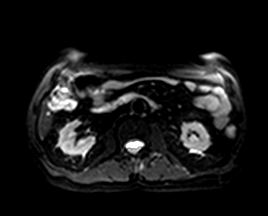
[im 74/111]
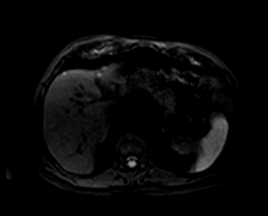
[im 111/111]
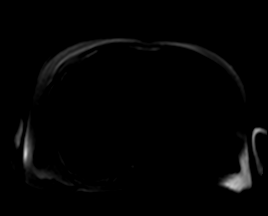

[Series 11: DWI · axial · 6.0mm · 1.42mm/px · 1 of 37 slices shown (2 of 2)]
[im 1/37]
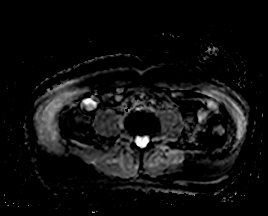

[Series 12: bSSFP · axial · 6.0mm · 0.74mm/px · 1 of 37 slices shown]
[im 1/37]
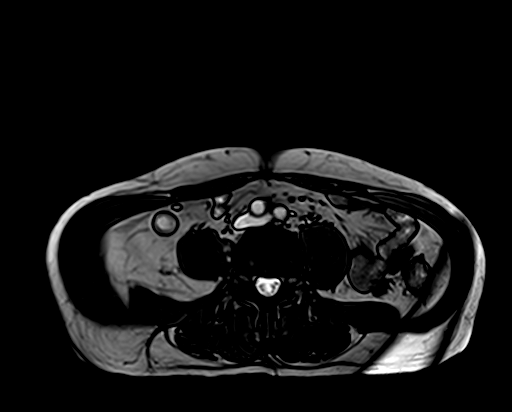

[Series 13: t1_vibe_fs_tra_p4_bh_pre · axial · 3.0mm · 1.19mm/px · z∈[-80,+180]mm · 3 of 88 slices shown]
[im 1/88]
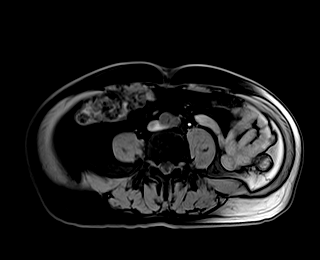
[im 44/88]
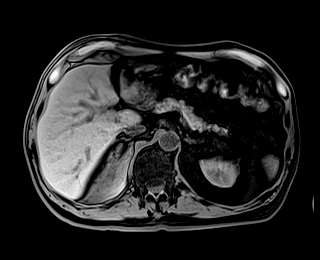
[im 88/88]
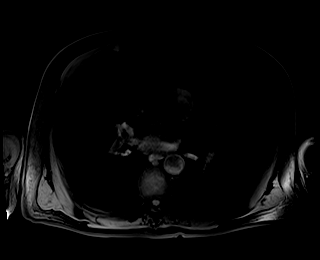

[Series 15: t1_vibe_fs_tra_p4_bh_post · axial · 3.0mm · 1.19mm/px · z∈[-80,+180]mm · 3 of 88 slices shown (1 of 4)]
[im 1/88]
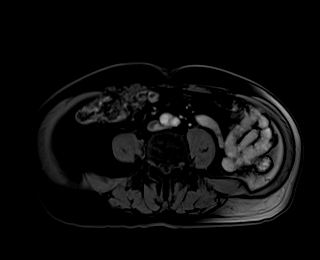
[im 44/88]
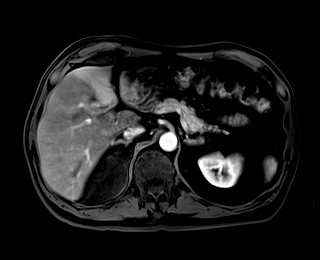
[im 88/88]
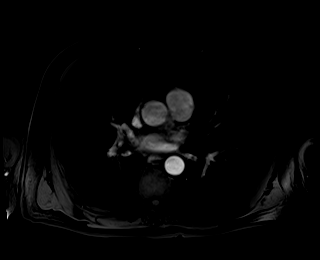

[Series 16: t1_vibe_fs_tra_p4_bh_post_sub · axial · 3.0mm · 1.19mm/px · z∈[-80,+180]mm · 3 of 88 slices shown (1 of 4)]
[im 1/88]
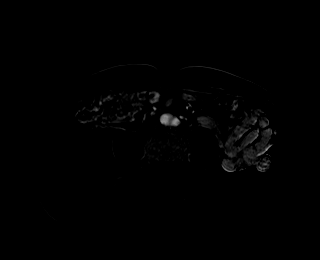
[im 44/88]
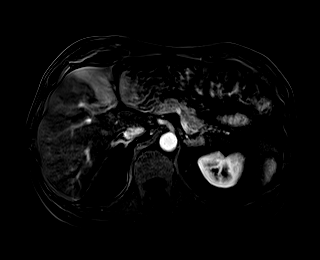
[im 88/88]
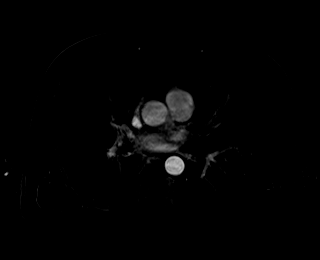

[Series 17: t1_vibe_fs_tra_p4_bh_post · axial · 3.0mm · 1.19mm/px · z∈[-80,+180]mm · 3 of 88 slices shown (2 of 4)]
[im 1/88]
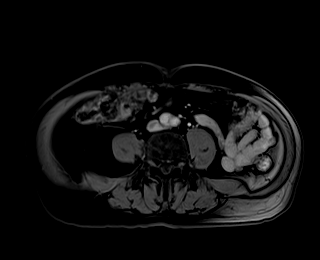
[im 44/88]
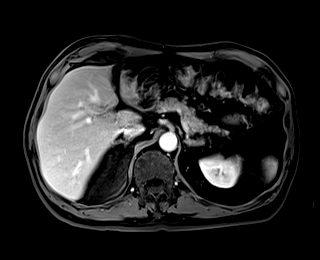
[im 88/88]
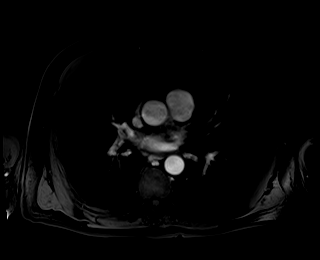

[Series 18: t1_vibe_fs_tra_p4_bh_post_sub · axial · 3.0mm · 1.19mm/px · z∈[-80,+180]mm · 3 of 88 slices shown (2 of 4)]
[im 1/88]
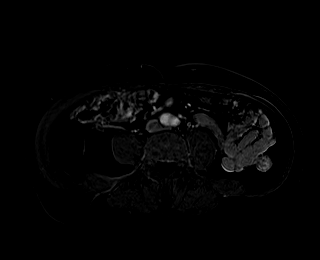
[im 44/88]
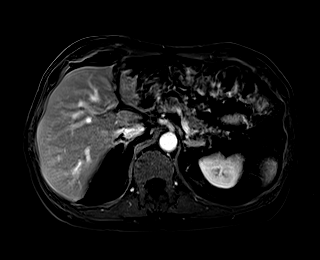
[im 88/88]
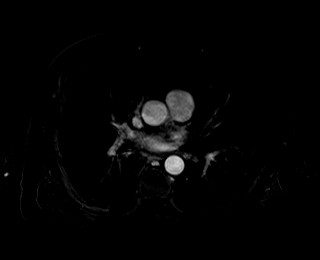

[Series 19: t1_vibe_fs_tra_p4_bh_post · axial · 3.0mm · 1.19mm/px · z∈[-80,+180]mm · 3 of 88 slices shown (3 of 4)]
[im 1/88]
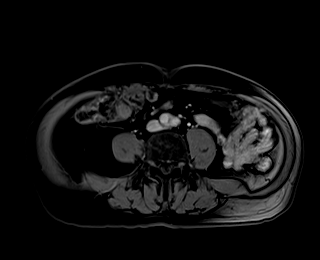
[im 44/88]
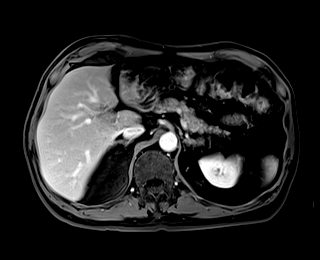
[im 88/88]
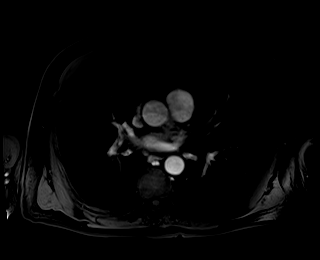

[Series 20: t1_vibe_fs_tra_p4_bh_post_sub · axial · 3.0mm · 1.19mm/px · z∈[-80,+180]mm · 3 of 88 slices shown (3 of 4)]
[im 1/88]
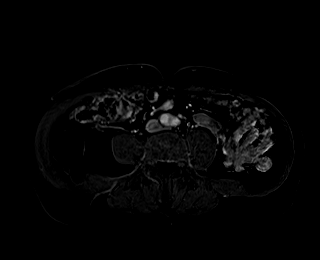
[im 44/88]
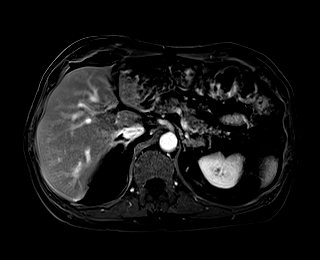
[im 88/88]
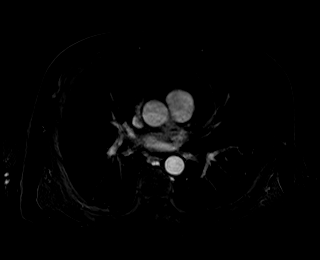

[Series 21: t1_vibe_fs_tra_p4_bh_post · axial · 3.0mm · 1.19mm/px · z∈[-80,+180]mm · 3 of 88 slices shown (4 of 4)]
[im 1/88]
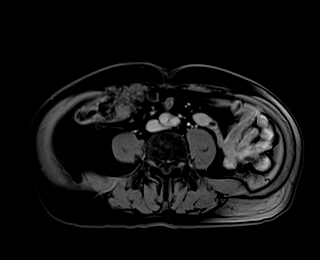
[im 44/88]
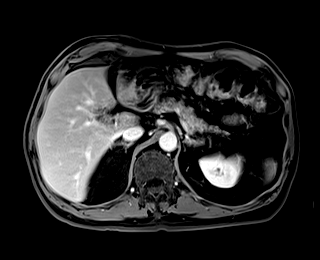
[im 88/88]
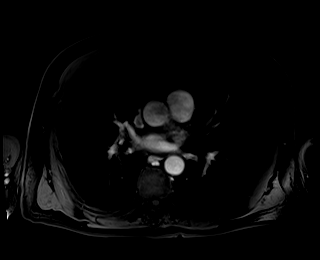

[Series 22: t1_vibe_fs_tra_p4_bh_post_sub · axial · 3.0mm · 1.19mm/px · z∈[-80,+180]mm · 3 of 88 slices shown (4 of 4)]
[im 1/88]
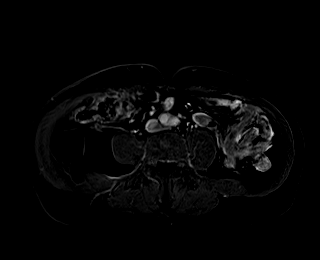
[im 44/88]
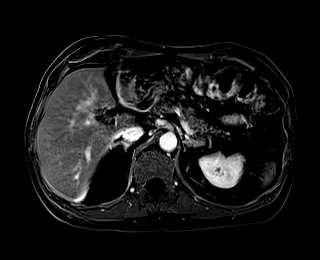
[im 88/88]
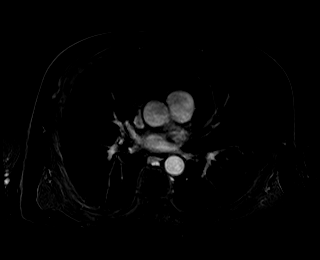

[Series 23: T1 dynamic post-contrast · coronal · 3.0mm · 1.31mm/px · 3 of 80 slices shown]
[im 1/80]
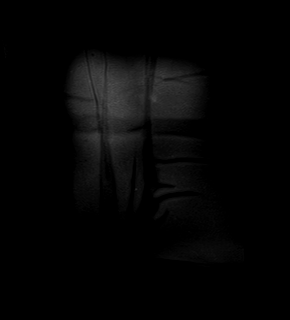
[im 40/80]
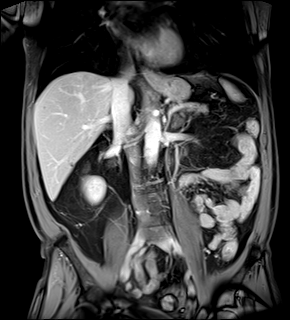
[im 80/80]
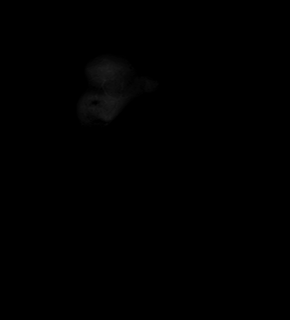

[48 of 48 positions shown; findings below may reference images not displayed]

FINDINGS: Lower chest: No acute findings.

Hepatobiliary: A few tiny sub-cm cysts are seen in both the right
and left lobes. A heterogeneously enhancing soft tissue mass is seen
in the lateral segment of the left lobe (segment 2) which has both
cystic and solid components. This measures 6.5 x 6.1 cm on image
31/15. The solid component shows arterial hyperenhancement with lack
of contrast washout, however there is no evidence of progressive
nodular enhancement. A 1.6 cm lesion is also seen in the dome of the
right lobe, which shows mild T2 hyperintensity and thin peripheral
rim enhancement. These are also nonspecific characteristics.
Gallbladder is unremarkable. No evidence of biliary ductal
dilatation.

Pancreas:  No mass or inflammatory changes.

Spleen:  Within normal limits in size and appearance.

Adrenals/Urinary Tract: No masses identified. No evidence of
hydronephrosis.

Stomach/Bowel: Visualized portion unremarkable.

Vascular/Lymphatic: No pathologically enlarged lymph nodes
identified. No abdominal aortic aneurysm.

Other:  None.

Musculoskeletal:  No suspicious bone lesions identified.
IMPRESSION: 6.5 cm heterogeneous soft tissue mass in the lateral segment of the
left lobe, with nonspecific characteristics. This could represent an
atypical hemangioma, however malignancy cannot be excluded. 1.6 cm
indeterminate lesion is also seen in the dome of the right hepatic
lobe. Consider further evaluation with nuclear medicine tagged red
blood cell scan with SPECT imaging.

## 2020-09-24 MED ORDER — GADOBUTROL 1 MMOL/ML IV SOLN
7.0000 mL | Freq: Once | INTRAVENOUS | Status: AC | PRN
  Administered 2020-09-24: 7 mL via INTRAVENOUS

## 2020-09-25 ENCOUNTER — Telehealth: Payer: Self-pay | Admitting: Registered Nurse

## 2020-09-25 ENCOUNTER — Other Ambulatory Visit: Payer: Self-pay | Admitting: Registered Nurse

## 2020-09-25 DIAGNOSIS — D1803 Hemangioma of intra-abdominal structures: Secondary | ICD-10-CM

## 2020-09-25 DIAGNOSIS — K769 Liver disease, unspecified: Secondary | ICD-10-CM

## 2020-09-25 NOTE — Telephone Encounter (Signed)
Called patient to discuss imaging result. MRI liver showed indeterminate findings of atypical hemangioma vs neoplasm. Need nuclear tagged RBC with spect for further work up. Left voicemail letting him know further imaging is warranted and placed order for this to take place at [place], NP

## 2020-10-01 ENCOUNTER — Ambulatory Visit (HOSPITAL_COMMUNITY): Payer: Medicare Other

## 2020-10-03 ENCOUNTER — Ambulatory Visit (HOSPITAL_COMMUNITY)
Admission: RE | Admit: 2020-10-03 | Discharge: 2020-10-03 | Disposition: A | Discharge status: Home / Self Care | Payer: Medicare Other | Source: Ambulatory Visit | Attending: Registered Nurse | Admitting: Registered Nurse

## 2020-10-03 ENCOUNTER — Other Ambulatory Visit: Payer: Self-pay

## 2020-10-03 DIAGNOSIS — K769 Liver disease, unspecified: Secondary | ICD-10-CM | POA: Insufficient documentation

## 2020-10-03 DIAGNOSIS — D1803 Hemangioma of intra-abdominal structures: Secondary | ICD-10-CM | POA: Diagnosis present

## 2020-10-03 IMAGING — NM NM SCAN TUMOR LOCALIZE WITH SPECT
9 series · 34 of 34 positions shown · non-contrast
Comparison: MRI on [DATE]

CLINICAL DATA: Indeterminate liver lesions. Possible hemangioma. No
history of malignancy.

EXAM:
NUCLEAR MEDICINE TAGGED RED BLOOD CELL SCAN WITH SPECT IMAGING
TECHNIQUE: Radionuclide angiogram, and immediate and 1 hour planar images of
the abdomen were obtained following intravenous administration of
[UM] labeled red blood cells. SPECT imaging was also performed.
RADIOPHARMACEUTICALS:  26.1 mCi [UM] pertechnetate in-vitro
labeled red cells.

[Series 1: spect - (id)_(id)_tra · 8.3mm · 8.28mm/px · 6 of 64 frames shown]
[frame 6/64]
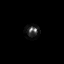
[frame 16/64]
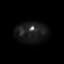
[frame 27/64]
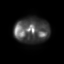
[frame 38/64]
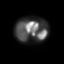
[frame 48/64]
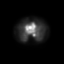
[frame 59/64]
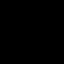

[Series 1: flow · 4.14mm/px · 6 of 48 frames shown (1 of 2)]
[frame 5/48]
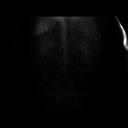
[frame 13/48]
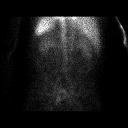
[frame 21/48]
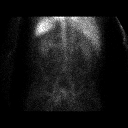
[frame 29/48]
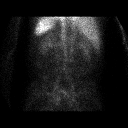
[frame 37/48]
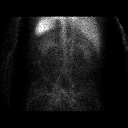
[frame 45/48]
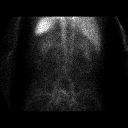

[Series 1: spect - (id)_(id)_cor · 8.3mm · 8.28mm/px · 6 of 64 frames shown]
[frame 6/64]
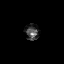
[frame 16/64]
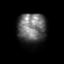
[frame 27/64]
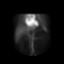
[frame 38/64]
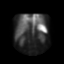
[frame 48/64]
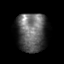
[frame 59/64]
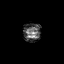

[Series 1: flow · 4.14mm/px · 6 of 48 frames shown (2 of 2)]
[frame 5/48]
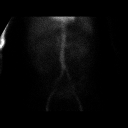
[frame 13/48]
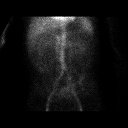
[frame 21/48]
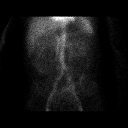
[frame 29/48]
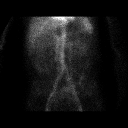
[frame 37/48]
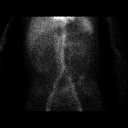
[frame 45/48]
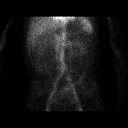

[Series 2: ant-post immed · 4.14mm/px · 1 of 1 slices shown (1 of 4)]
[im 1/1]
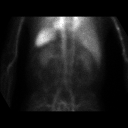

[Series 2: ant-post immed · 4.14mm/px · 1 of 1 slices shown (2 of 4)]
[im 1/1]
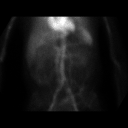

[Series 3: ant-post immed · 4.14mm/px · 1 of 1 slices shown (3 of 4)]
[im 1/1]
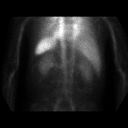

[Series 3: ant-post immed · 4.14mm/px · 1 of 1 slices shown (4 of 4)]
[im 1/1]
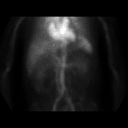

[Series 4: liver spect · 8.28mm/px · 6 of 64 frames shown]
[frame 6/64]
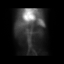
[frame 16/64]
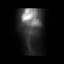
[frame 27/64]
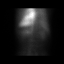
[frame 38/64]
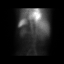
[frame 48/64]
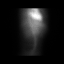
[frame 59/64]
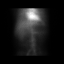

[34 of 34 positions shown; findings below may reference images not displayed]

FINDINGS: Planar and SPECT images show normal blood pool activity. No focal
areas of increased radiopharmaceutical uptake are identified in the
liver , although assessment of location of the larger lesion in the
left hepatic lobe is suboptimal due to its close position to the
heart/cardiac blood pool activity. Because these liver lesions do
not show convincing evidence of increased radiopharmaceutical
uptake, they remain indeterminate.
IMPRESSION: Liver lesions seen on recent MRI show no convincing evidence of
increased radiopharmaceutical uptake, and therefore remain
indeterminate. Recommend correlation with tumor markers, and
consider short-term follow-up by MRI in 3 months versus tissue
sampling.

## 2020-10-03 MED ORDER — TECHNETIUM TC 99M-LABELED RED BLOOD CELLS IV KIT
26.1000 | PACK | Freq: Once | INTRAVENOUS | Status: AC | PRN
  Administered 2020-10-03: 26.1 via INTRAVENOUS

## 2020-10-04 ENCOUNTER — Other Ambulatory Visit: Payer: Self-pay | Admitting: Registered Nurse

## 2020-10-04 DIAGNOSIS — K769 Liver disease, unspecified: Secondary | ICD-10-CM

## 2020-10-25 ENCOUNTER — Telehealth: Payer: Self-pay | Admitting: Registered Nurse

## 2020-10-25 NOTE — Telephone Encounter (Signed)
Left message for patient to schedule Annual Wellness Visit.  Please schedule with Nurse Health Advisor Martha Stanley, RN at [place]  

## 2020-11-14 ENCOUNTER — Telehealth: Payer: Self-pay | Admitting: Registered Nurse

## 2020-11-14 NOTE — Telephone Encounter (Signed)
Left message for patient to schedule Annual Wellness Visit.  Please schedule with Nurse Health Advisor Julie Greer, RN at [place]   

## 2021-01-09 ENCOUNTER — Telehealth: Payer: Self-pay | Admitting: Registered Nurse

## 2021-01-09 NOTE — Telephone Encounter (Signed)
I attempted to leave message for patient to call back and schedule Medicare Annual Wellness Visit (AWV), but no answer.   Please offer to do virtually or by telephone.  Left office number and my jabber [phone].  Due for AWVI  Please schedule at anytime with Nurse Health Advisor.

## 2021-01-15 ENCOUNTER — Ambulatory Visit (HOSPITAL_COMMUNITY)
Admission: RE | Admit: 2021-01-15 | Discharge: 2021-01-15 | Disposition: A | Discharge status: Home / Self Care | Payer: Medicare Other | Source: Ambulatory Visit | Attending: Registered Nurse | Admitting: Registered Nurse

## 2021-01-15 ENCOUNTER — Other Ambulatory Visit: Payer: Self-pay

## 2021-01-15 ENCOUNTER — Telehealth: Payer: Self-pay

## 2021-01-15 ENCOUNTER — Encounter: Payer: Self-pay | Admitting: Registered Nurse

## 2021-01-15 ENCOUNTER — Ambulatory Visit (INDEPENDENT_AMBULATORY_CARE_PROVIDER_SITE_OTHER): Payer: Medicare Other | Admitting: Registered Nurse

## 2021-01-15 VITALS — BP 129/89 | HR 66 | Temp 98.2°F | Resp 16 | Ht 70.0 in | Wt 144.0 lb

## 2021-01-15 DIAGNOSIS — R0609 Other forms of dyspnea: Secondary | ICD-10-CM

## 2021-01-15 DIAGNOSIS — R531 Weakness: Secondary | ICD-10-CM

## 2021-01-15 DIAGNOSIS — E782 Mixed hyperlipidemia: Secondary | ICD-10-CM

## 2021-01-15 DIAGNOSIS — Z125 Encounter for screening for malignant neoplasm of prostate: Secondary | ICD-10-CM

## 2021-01-15 DIAGNOSIS — G1220 Motor neuron disease, unspecified: Secondary | ICD-10-CM

## 2021-01-15 DIAGNOSIS — R0600 Dyspnea, unspecified: Secondary | ICD-10-CM

## 2021-01-15 DIAGNOSIS — F32A Depression, unspecified: Secondary | ICD-10-CM

## 2021-01-15 DIAGNOSIS — R945 Abnormal results of liver function studies: Secondary | ICD-10-CM

## 2021-01-15 DIAGNOSIS — R7989 Other specified abnormal findings of blood chemistry: Secondary | ICD-10-CM

## 2021-01-15 DIAGNOSIS — Z8639 Personal history of other endocrine, nutritional and metabolic disease: Secondary | ICD-10-CM

## 2021-01-15 LAB — LIPID PANEL
Cholesterol: 223 mg/dL — ABNORMAL HIGH (ref 0–200)
HDL: 35.5 mg/dL — ABNORMAL LOW (ref >39.00)
LDL Cholesterol: 157 mg/dL — ABNORMAL HIGH (ref 0–99)
NonHDL: 187.08
Total CHOL/HDL Ratio: 6
Triglycerides: 149 mg/dL (ref 0.0–149.0)
VLDL: 29.8 mg/dL (ref 0.0–40.0)

## 2021-01-15 LAB — URINALYSIS, ROUTINE W REFLEX MICROSCOPIC
Bilirubin Urine: NEGATIVE
Hgb urine dipstick: NEGATIVE
Ketones, ur: NEGATIVE
Leukocytes,Ua: NEGATIVE
Nitrite: NEGATIVE
RBC / HPF: NONE SEEN (ref 0–?)
Specific Gravity, Urine: >=1.03 — AB (ref 1.000–1.030)
Total Protein, Urine: NEGATIVE
Urine Glucose: NEGATIVE
Urobilinogen, UA: 0.2 (ref 0.0–1.0)
pH: 5.5 (ref 5.0–8.0)

## 2021-01-15 LAB — CBC WITH DIFFERENTIAL/PLATELET
Basophils Absolute: 0.1 10*3/uL (ref 0.0–0.1)
Basophils Relative: 0.9 % (ref 0.0–3.0)
Eosinophils Absolute: 0.1 10*3/uL (ref 0.0–0.7)
Eosinophils Relative: 1 % (ref 0.0–5.0)
HCT: 39.2 % (ref 39.0–52.0)
Hemoglobin: 13.1 g/dL (ref 13.0–17.0)
Lymphocytes Relative: 29.1 % (ref 12.0–46.0)
Lymphs Abs: 2 10*3/uL (ref 0.7–4.0)
MCHC: 33.4 g/dL (ref 30.0–36.0)
MCV: 89.9 fl (ref 78.0–100.0)
Monocytes Absolute: 0.6 10*3/uL (ref 0.1–1.0)
Monocytes Relative: 8.4 % (ref 3.0–12.0)
Neutro Abs: 4.1 10*3/uL (ref 1.4–7.7)
Neutrophils Relative %: 60.6 % (ref 43.0–77.0)
Platelets: 201 10*3/uL (ref 150.0–400.0)
RBC: 4.37 Mil/uL (ref 4.22–5.81)
RDW: 14 % (ref 11.5–15.5)
WBC: 6.8 10*3/uL (ref 4.0–10.5)

## 2021-01-15 LAB — COMPREHENSIVE METABOLIC PANEL
ALT: 13 U/L (ref 0–53)
AST: 14 U/L (ref 0–37)
Albumin: 4 g/dL (ref 3.5–5.2)
Alkaline Phosphatase: 66 U/L (ref 39–117)
BUN: 22 mg/dL (ref 6–23)
CO2: 26 mEq/L (ref 19–32)
Calcium: 9 mg/dL (ref 8.4–10.5)
Chloride: 105 mEq/L (ref 96–112)
Creatinine, Ser: 1 mg/dL (ref 0.40–1.50)
GFR: 76.25 mL/min (ref >60.00)
Glucose, Bld: 108 mg/dL — ABNORMAL HIGH (ref 70–99)
Potassium: 3.5 mEq/L (ref 3.5–5.1)
Sodium: 141 mEq/L (ref 135–145)
Total Bilirubin: 0.4 mg/dL (ref 0.2–1.2)
Total Protein: 6.3 g/dL (ref 6.0–8.3)

## 2021-01-15 LAB — PSA, MEDICARE: PSA: 2.62 ng/ml (ref 0.10–4.00)

## 2021-01-15 LAB — C-REACTIVE PROTEIN: CRP: <1 mg/dL (ref 0.5–20.0)

## 2021-01-15 LAB — TSH: TSH: 2.51 u[IU]/mL (ref 0.35–5.50)

## 2021-01-15 LAB — SEDIMENTATION RATE: Sed Rate: 8 mm/hr (ref 0–20)

## 2021-01-15 LAB — HEMOGLOBIN A1C: Hgb A1c MFr Bld: 5.3 % (ref 4.6–6.5)

## 2021-01-15 IMAGING — MR MR HEAD WO/W CM
11 of 13 series · 36 of 48 positions shown · IV contrast (gadavist)
Comparison: Head CT [DATE]

CLINICAL DATA: Rapidly progressive weakness; demyelinating disease;
Motor neuron disease; rule out demyelinating disease. Progressive
weakness and fatigue. Strong family history of parkinsons. TIGER
(dyspnea on exertion); mixed hyperlipidemia; abnormal LFTs.

EXAM:
MRI HEAD WITHOUT AND WITH CONTRAST
TECHNIQUE: Multiplanar, multiecho pulse sequences of the brain and surrounding
structures were obtained without and with intravenous contrast.
CONTRAST:  7mL GADAVIST GADOBUTROL 1 MMOL/ML IV SOLN

[Series 5: DWI · coronal · 4.0mm · 0.88mm/px · 4 of 32 slices shown (1 of 5)]
[im 1/32]
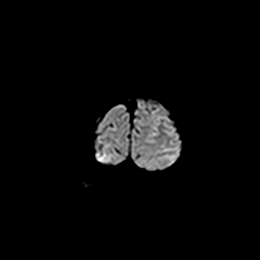
[im 11/32]
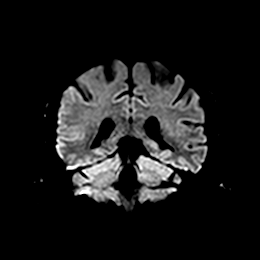
[im 21/32]
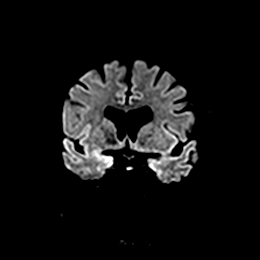
[im 32/32]
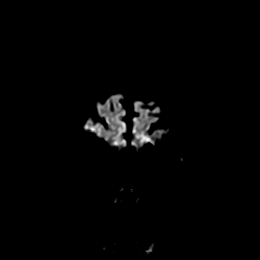

[Series 6: DWI · coronal · 4.0mm · 0.88mm/px · 3 of 32 slices shown (2 of 5)]
[im 1/32]
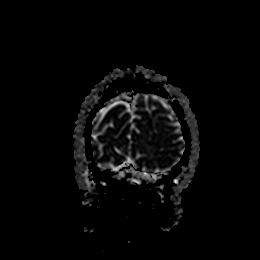
[im 16/32]
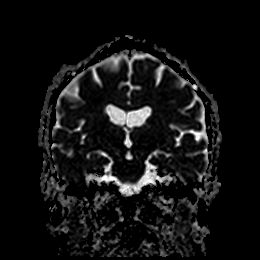
[im 32/32]
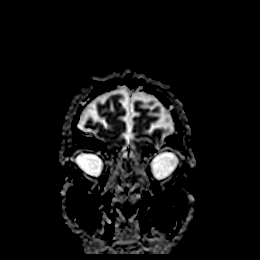

[Series 7: T1 · sagittal · 5.0mm · 0.80mm/px · 2 of 23 slices shown]
[im 1/23]
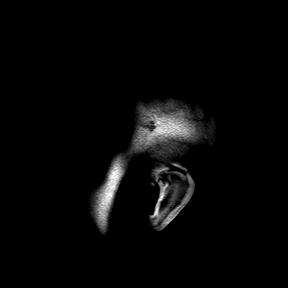
[im 23/23]
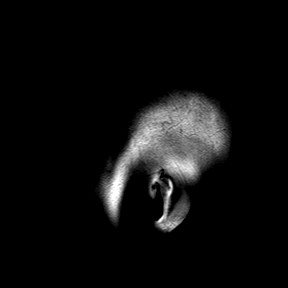

[Series 8: T2 · axial · 5.0mm · 0.72mm/px · z∈[-74,+79]mm · 2 of 23 slices shown (1 of 2)]
[im 1/23]
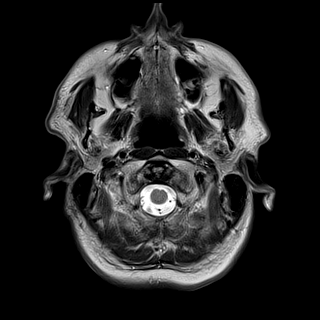
[im 23/23]
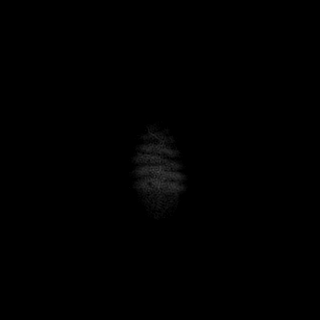

[Series 9: ax hemo · axial · 5.0mm · 0.86mm/px · z∈[-68,+75]mm · 3 of 25 slices shown]
[im 1/25]
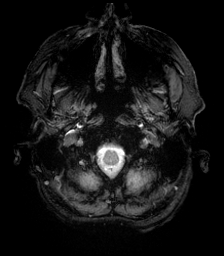
[im 13/25]
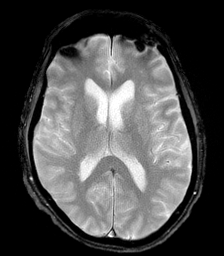
[im 25/25]
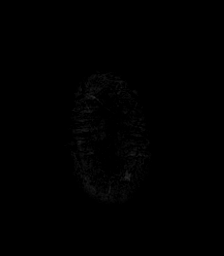

[Series 10: FLAIR · axial · 4.0mm · 0.43mm/px · z∈[-70,+77]mm · 4 of 38 slices shown]
[im 1/38]
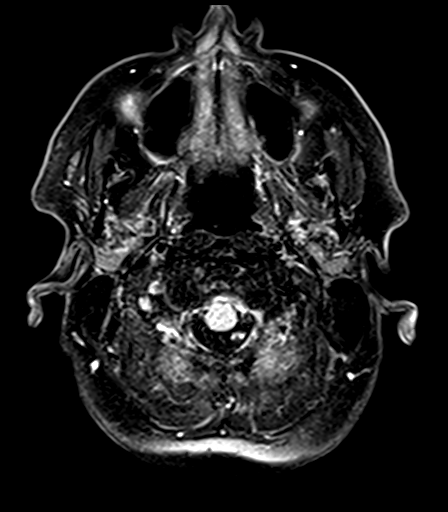
[im 13/38]
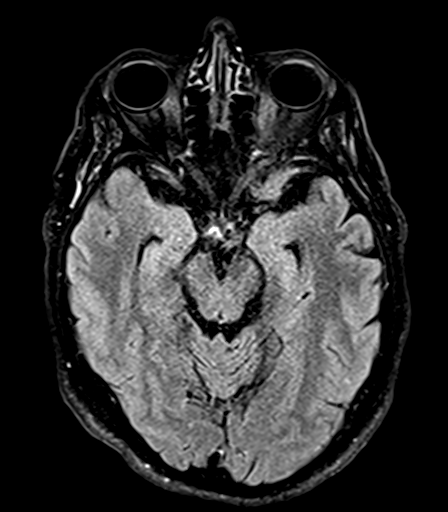
[im 25/38]
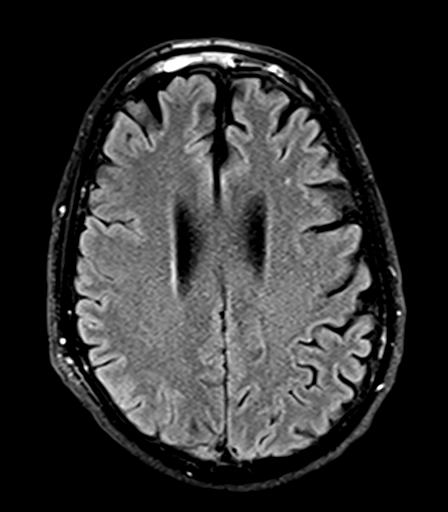
[im 38/38]
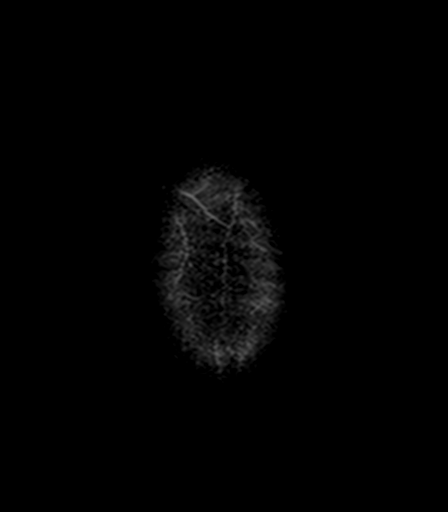

[Series 12: T2 · coronal · 5.0mm · 0.72mm/px · 3 of 28 slices shown (2 of 2)]
[im 1/28]
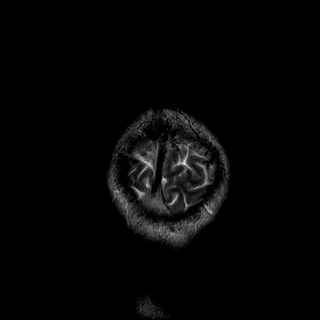
[im 14/28]
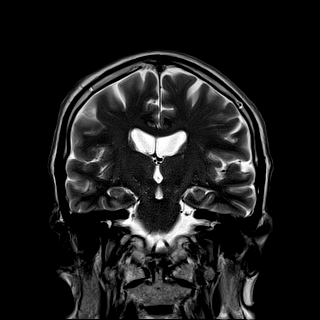
[im 28/28]
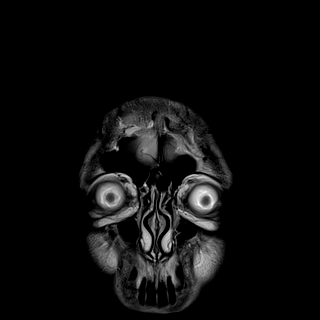

[Series 14: T1 post-contrast · coronal · 5.0mm · 0.34mm/px · 3 of 29 slices shown]
[im 1/29]
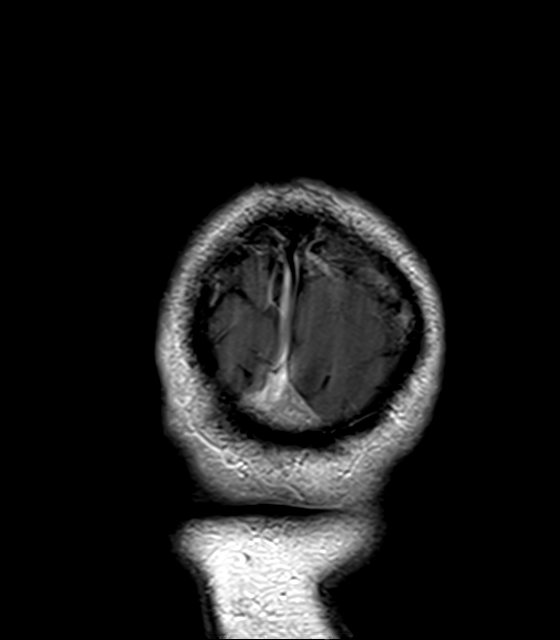
[im 15/29]
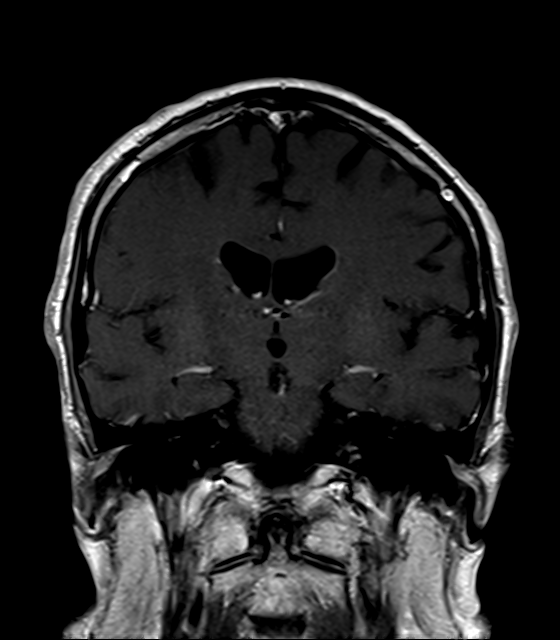
[im 29/29]
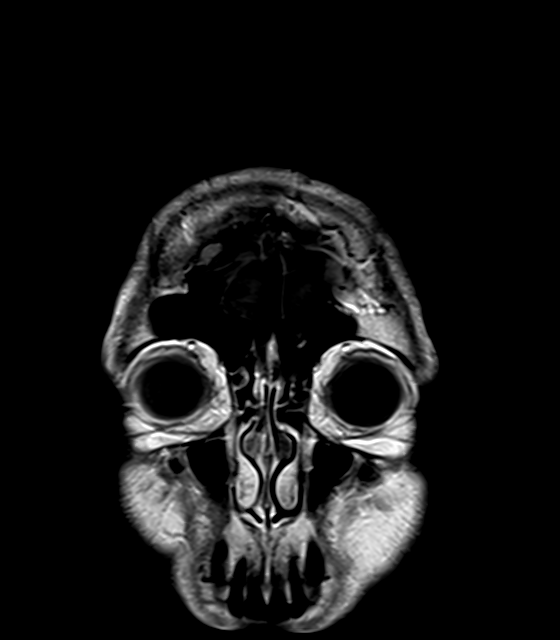

[Series 100: DWI · axial · 4.0mm · 0.88mm/px · z∈[-67,+72]mm · 4 of 36 slices shown (3 of 5)]
[im 1/36]
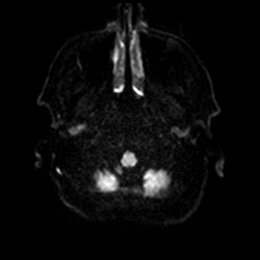
[im 12/36]
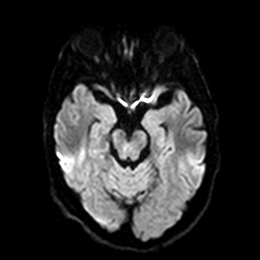
[im 24/36]
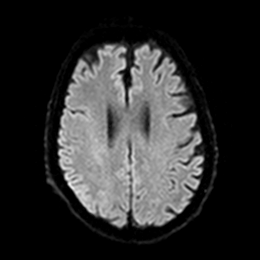
[im 36/36]
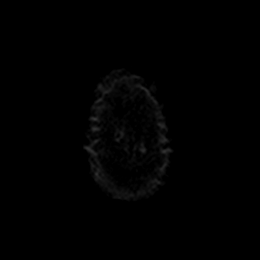

[Series 100: DWI · axial · 4.0mm · 0.88mm/px · z∈[-67,+72]mm · 4 of 36 slices shown (4 of 5)]
[im 1/36]
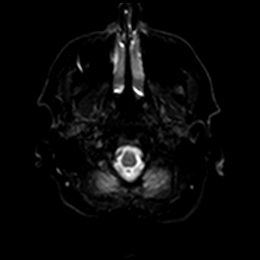
[im 12/36]
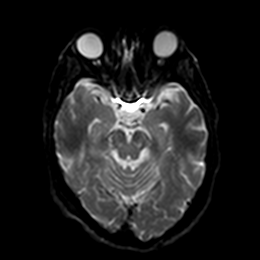
[im 24/36]
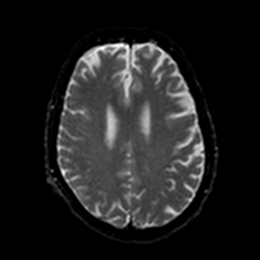
[im 36/36]
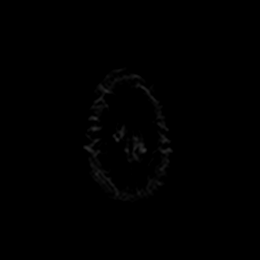

[Series 101: DWI · axial · 4.0mm · 0.88mm/px · z∈[-67,+72]mm · 4 of 36 slices shown (5 of 5)]
[im 1/36]
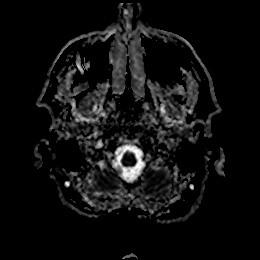
[im 12/36]
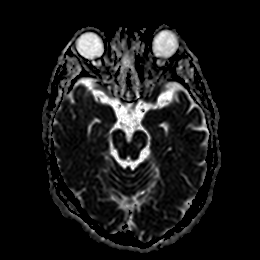
[im 24/36]
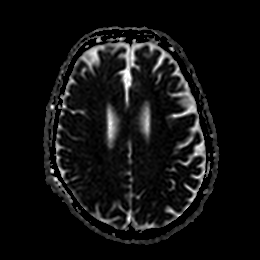
[im 36/36]
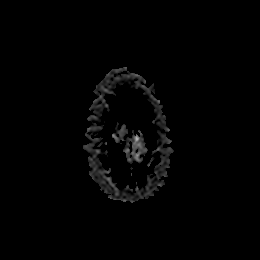

[36 of 48 positions shown; findings below may reference images not displayed]

FINDINGS: Brain: No acute infarction, hemorrhage, hydrocephalus, extra-axial
collection or mass lesion. A few punctate foci of T2 hyperintensity
are seen within the white matter of the cerebral hemispheres,
nonspecific. Mild parenchymal volume loss. No focus of abnormal
contrast enhancement.

Vascular: Normal flow voids.

Skull and upper cervical spine: Normal marrow signal.

Sinuses/Orbits: Negative.

Other: None.
IMPRESSION: 1. A few punctate T2 hyperintense lesions of the white matter. While
nonspecific, in this age group this is most likely related to early
chronic microangiopathy.
2. No focus of abnormal contrast enhancement.
3. Mild parenchymal volume loss.

## 2021-01-15 IMAGING — MR MR THORACIC SPINE WO/W CM
5 of 9 series · 25 of 48 positions shown · IV contrast (gadavist)
Comparison: None.

CLINICAL DATA: Demyelinating disease. Motor neuron disease.
Progressive weakness.

EXAM:
MRI THORACIC WITHOUT AND WITH CONTRAST
TECHNIQUE: Multiplanar and multiecho pulse sequences of the thoracic spine were
obtained without and with intravenous contrast.
CONTRAST:  7mL GADAVIST GADOBUTROL 1 MMOL/ML IV SOLN

[Series 26: T1 · sagittal · 3.3mm · 0.62mm/px · 3 of 12 slices shown (1 of 3)]
[im 1/12]
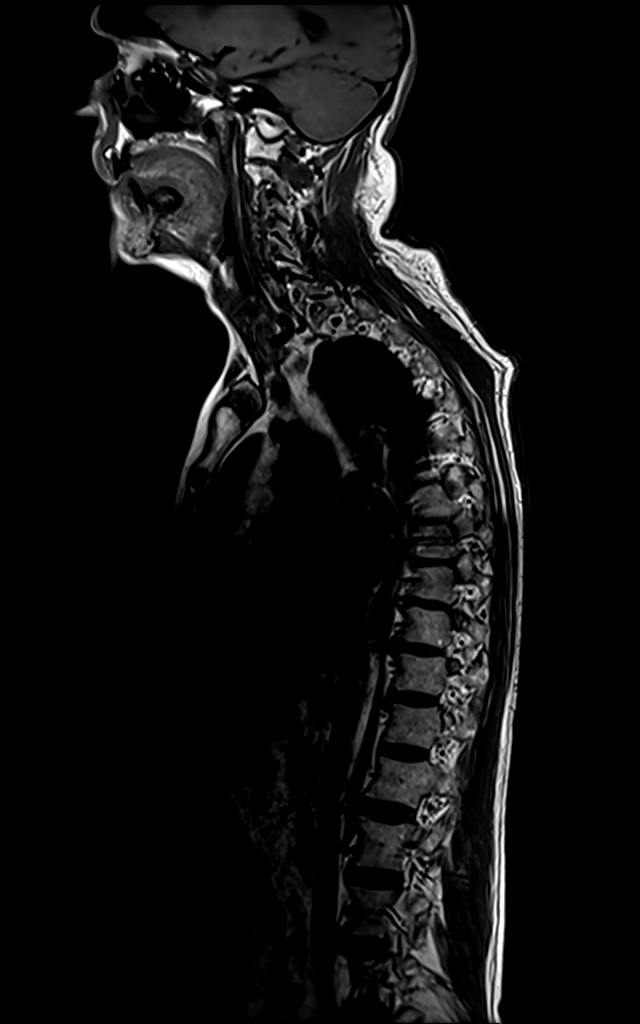
[im 6/12]
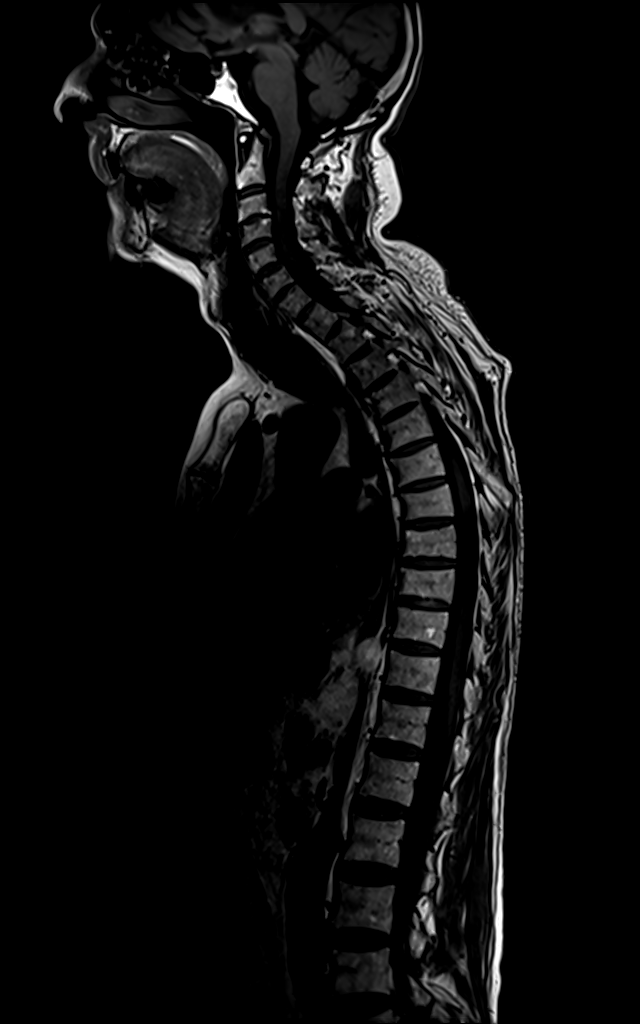
[im 12/12]
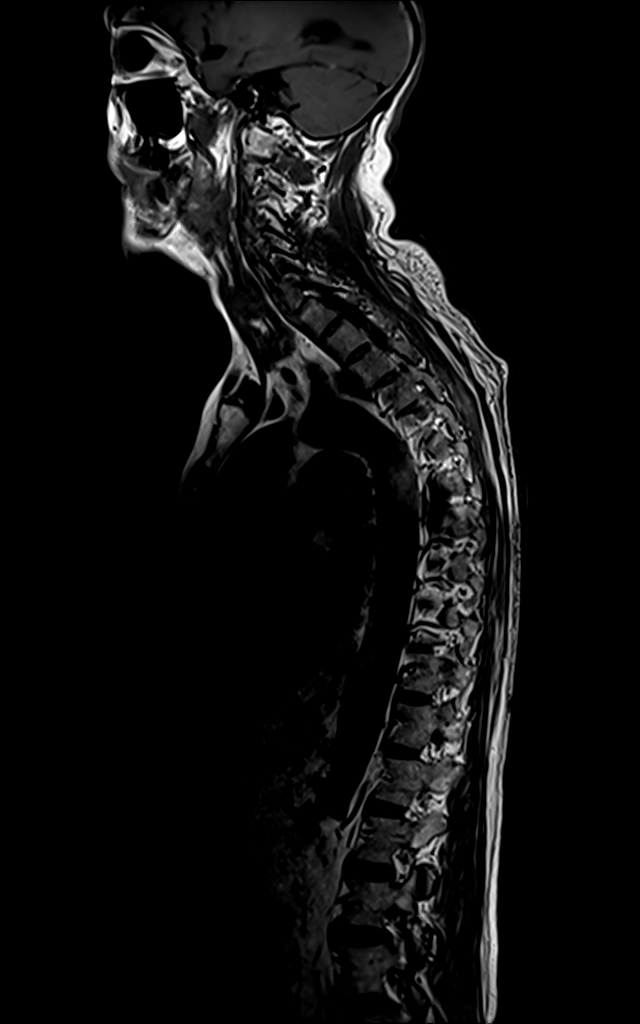

[Series 27: T2 · sagittal · 3.0mm · 1.06mm/px · 4 of 17 slices shown (1 of 2)]
[im 1/17]
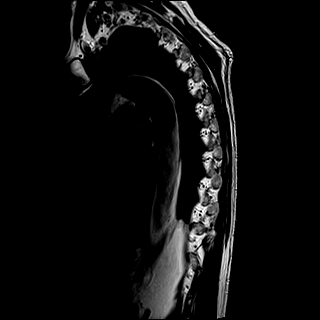
[im 6/17]
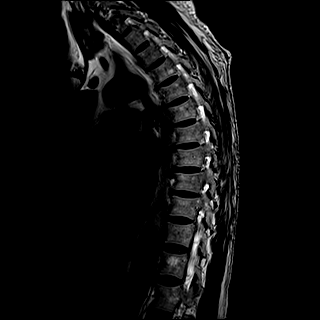
[im 11/17]
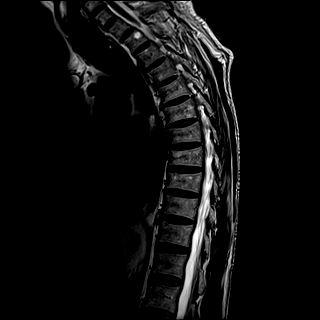
[im 17/17]
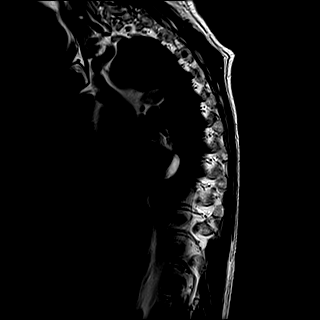

[Series 28: T1 · sagittal · 3.0mm · 1.33mm/px · 3 of 17 slices shown (2 of 3)]
[im 1/17]
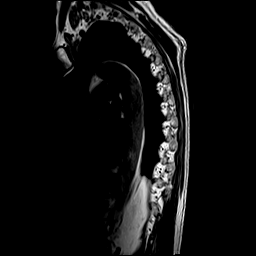
[im 9/17]
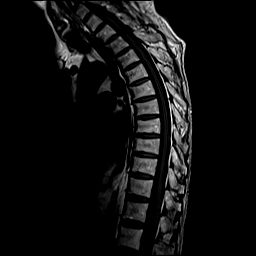
[im 17/17]
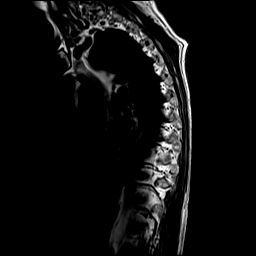

[Series 30: T2 · axial · 5.0mm · 0.74mm/px · z∈[-451,-236]mm · 8 of 39 slices shown (2 of 2)]
[im 1/39]
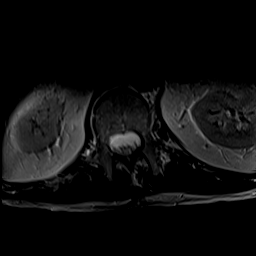
[im 6/39]
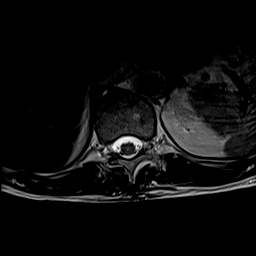
[im 11/39]
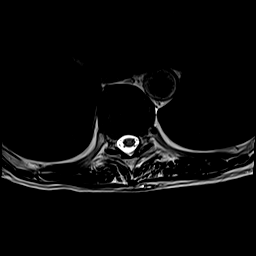
[im 17/39]
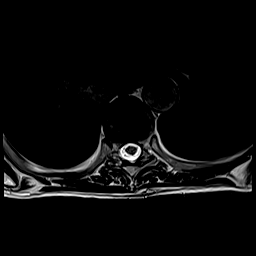
[im 22/39]
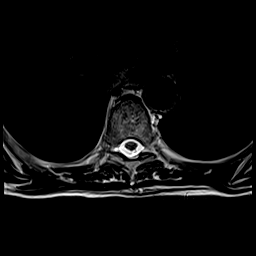
[im 28/39]
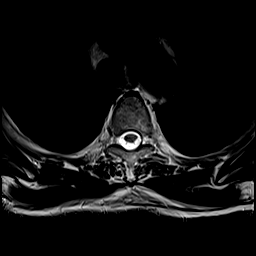
[im 33/39]
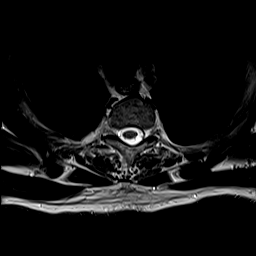
[im 39/39]
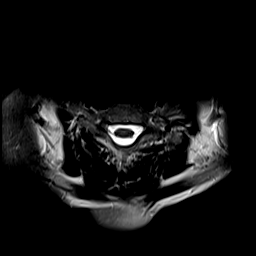

[Series 32: T1 · axial · non-contrast · 5.0mm · 0.37mm/px · z∈[-451,-258]mm · 7 of 39 slices shown (3 of 3)]
[im 1/39]
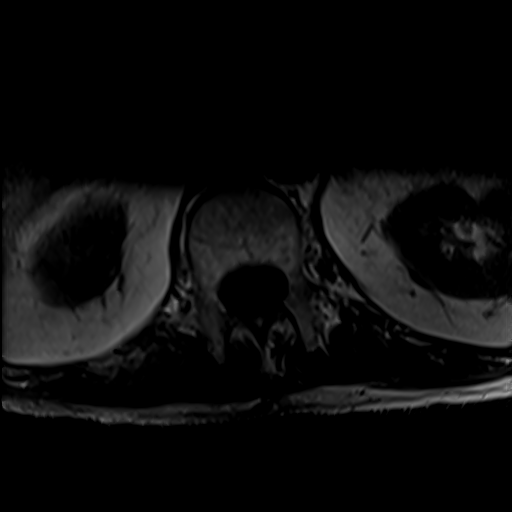
[im 6/39]
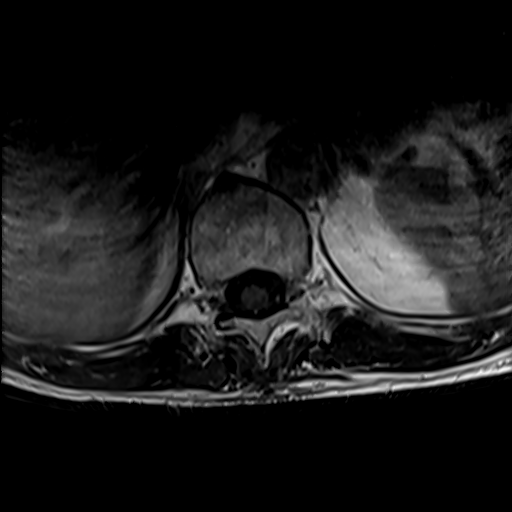
[im 11/39]
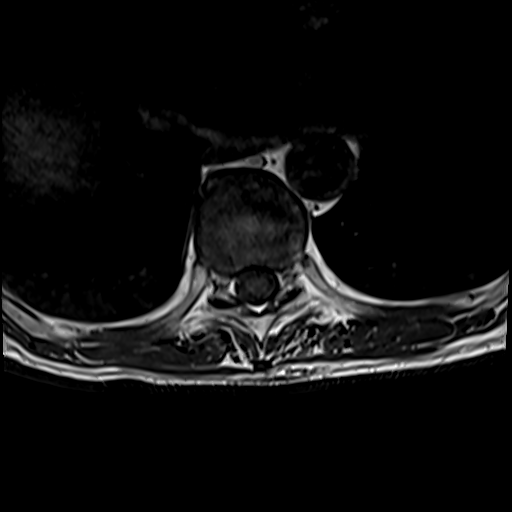
[im 17/39]
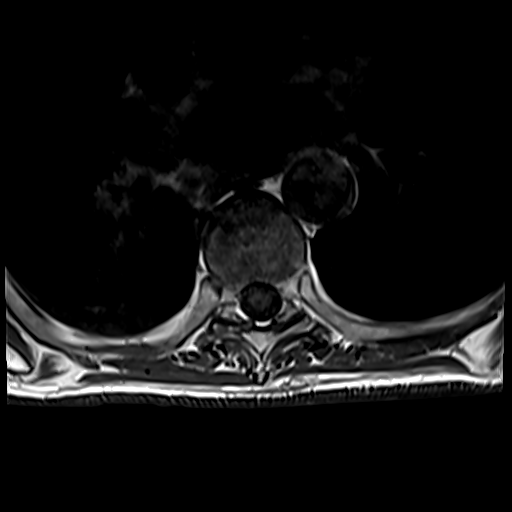
[im 22/39]
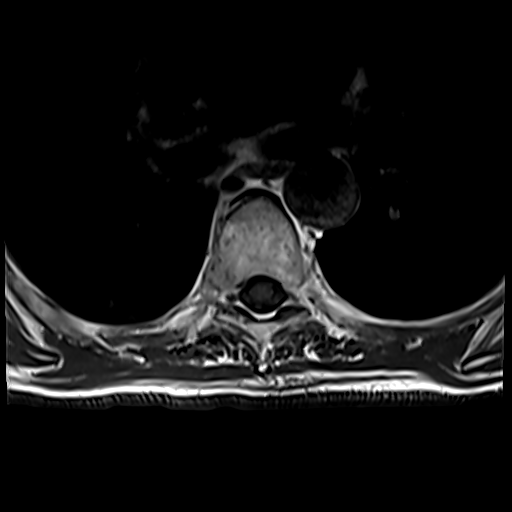
[im 28/39]
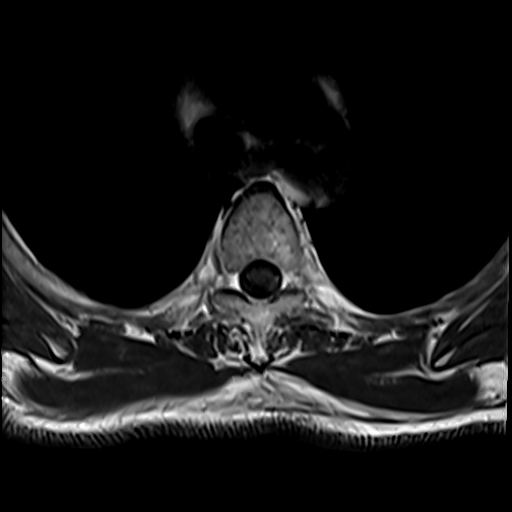
[im 33/39]
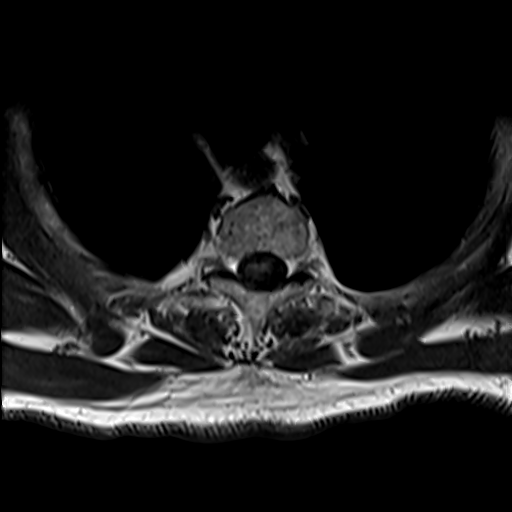

[25 of 48 positions shown; findings below may reference images not displayed]

FINDINGS: Alignment:  Normal

Vertebrae: Normal.  No fracture or focal bone lesion.

Cord: Normal. No cord compression or focal cord lesion. No evidence
of demyelinating disease.

Paraspinal and other soft tissues: Negative

Disc levels:

No significant disc level pathology. No bulge or herniation. No
stenosis of the canal or foramina. Minimal mid to lower thoracic
facet arthritis.
IMPRESSION: Negative thoracic examination. No evidence of demyelinating disease
or compressive pathology in the thoracic region.

## 2021-01-15 IMAGING — MR MR LUMBAR SPINE WO/W CM
6 of 7 series · 33 of 48 positions shown · IV contrast (gadavist)
Comparison: None.

CLINICAL DATA: Rapidly progressive weakness; demyelinating disease;
Motor neuron disease; rule out demyelinating disease. Progressive
weakness and fatigue. Strong family history of parkinsons. QUIRIJN
(dyspnea on exertion); mixed hyperlipidemia; abnormal LFTs.

EXAM:
MRI LUMBAR SPINE WITHOUT AND WITH CONTRAST
TECHNIQUE: Multiplanar and multiecho pulse sequences of the lumbar spine were
obtained without and with intravenous contrast.
CONTRAST:  7mL GADAVIST GADOBUTROL 1 MMOL/ML IV SOLN

[Series 24: T2 · sagittal · 4.0mm · 0.81mm/px · 4 of 15 slices shown (1 of 2)]
[im 1/15]
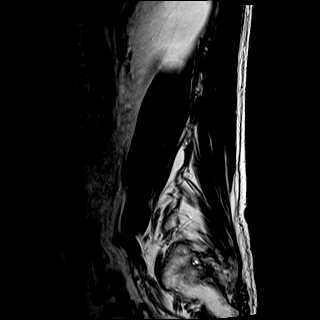
[im 5/15]
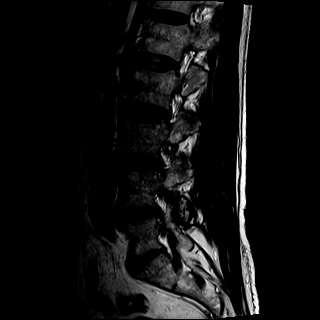
[im 10/15]
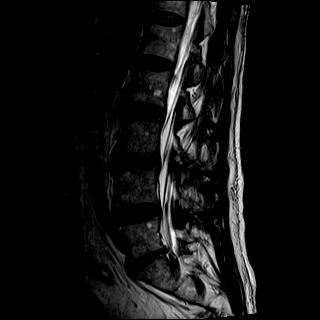
[im 15/15]
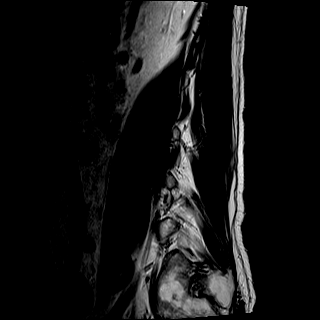

[Series 25: STIR · sagittal · 4.0mm · 0.51mm/px · 3 of 15 slices shown]
[im 1/15]
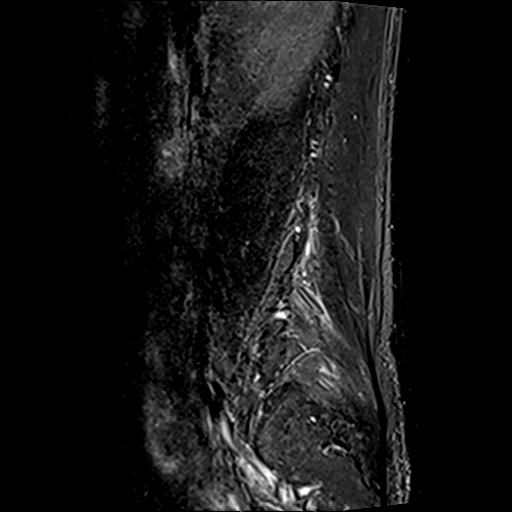
[im 5/15]
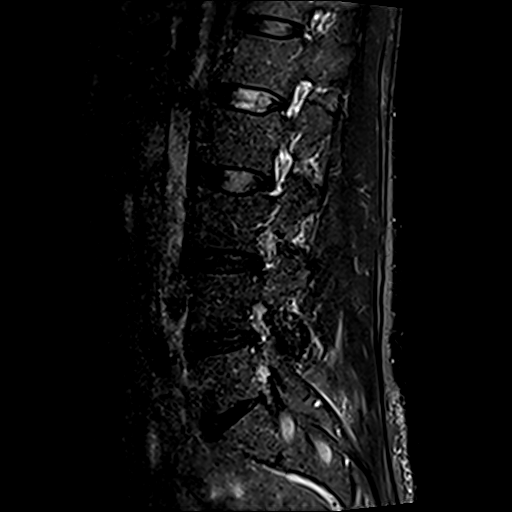
[im 10/15]
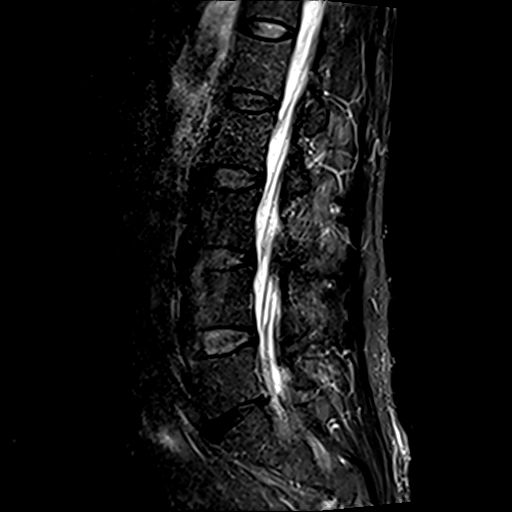

[Series 26: T1 · sagittal · 4.0mm · 1.02mm/px · 5 of 15 slices shown (1 of 2)]
[im 1/15]
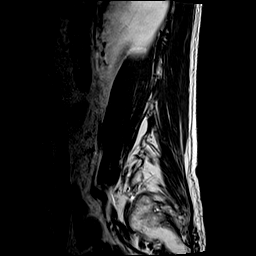
[im 4/15]
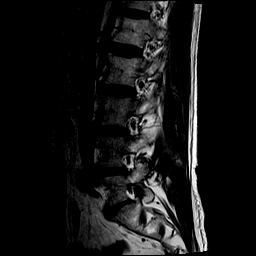
[im 8/15]
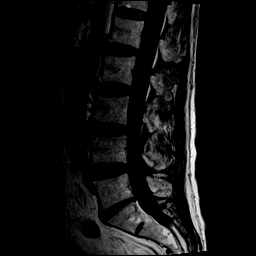
[im 11/15]
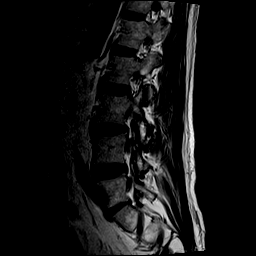
[im 15/15]
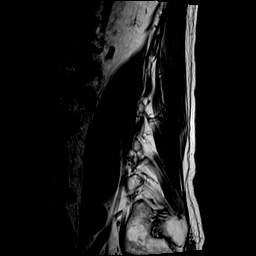

[Series 27: T2 · axial · 4.0mm · 0.70mm/px · z∈[-689,-515]mm · 8 of 31 slices shown (2 of 2)]
[im 1/31]
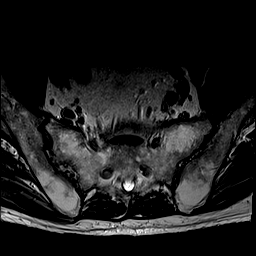
[im 4/31]
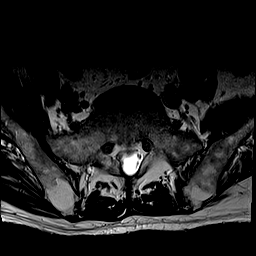
[im 11/31]
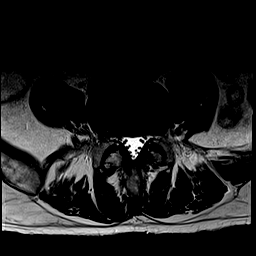
[im 14/31]
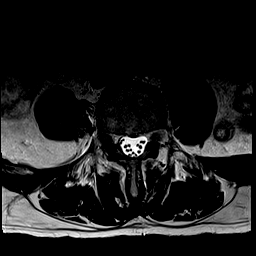
[im 17/31]
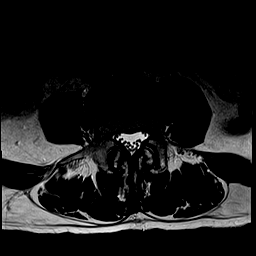
[im 21/31]
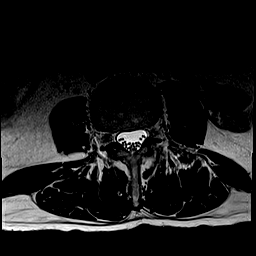
[im 27/31]
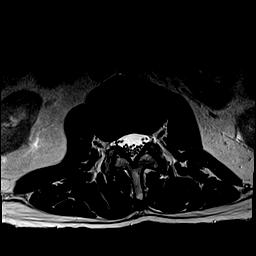
[im 31/31]
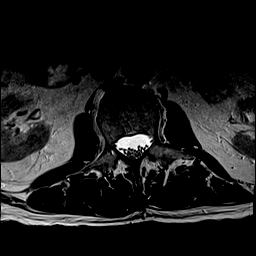

[Series 28: T1 · axial · 4.0mm · 0.35mm/px · z∈[-689,-515]mm · 8 of 31 slices shown (2 of 2)]
[im 1/31]
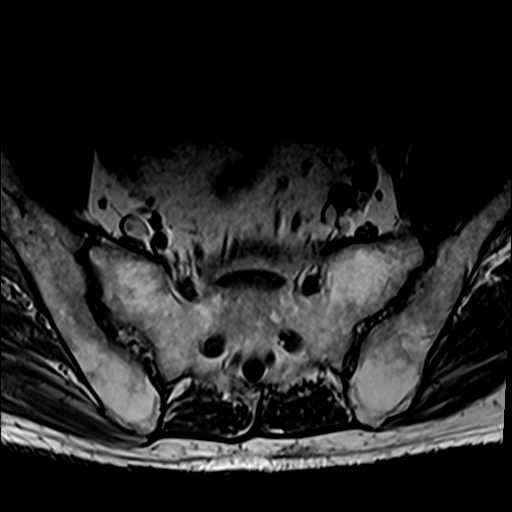
[im 4/31]
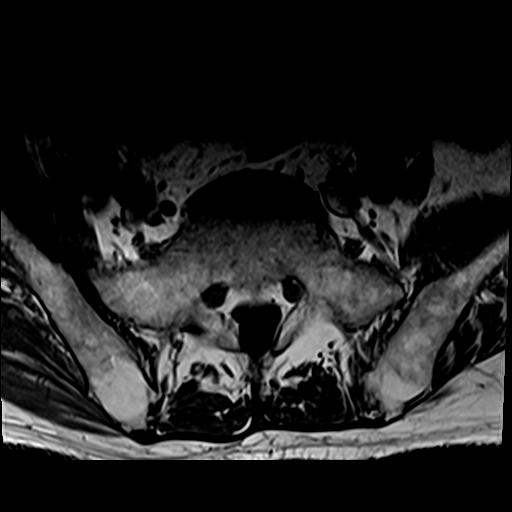
[im 11/31]
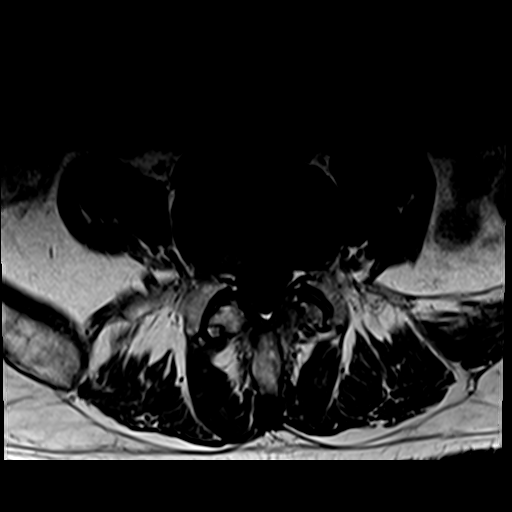
[im 14/31]
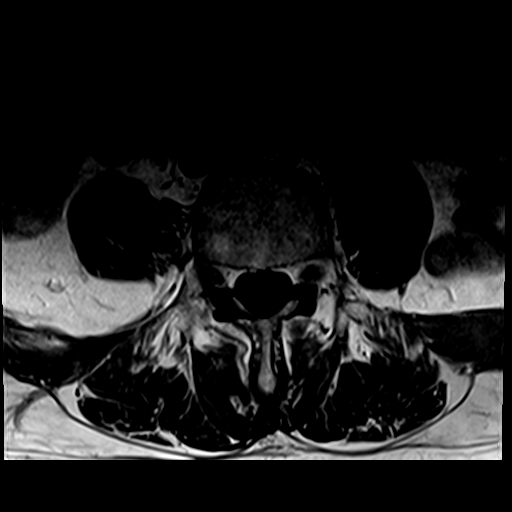
[im 17/31]
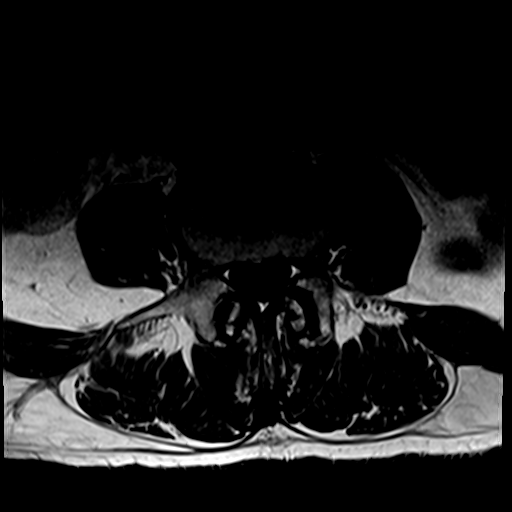
[im 21/31]
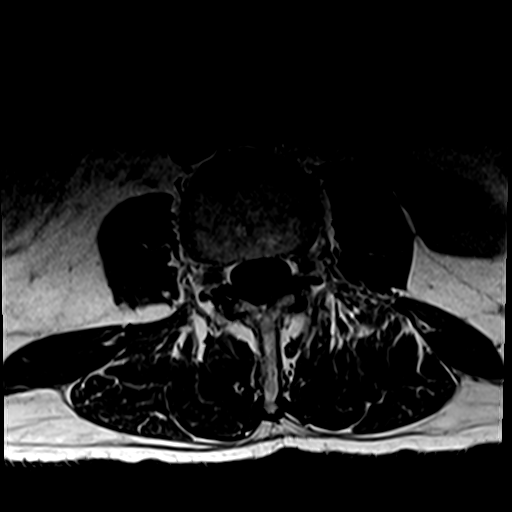
[im 27/31]
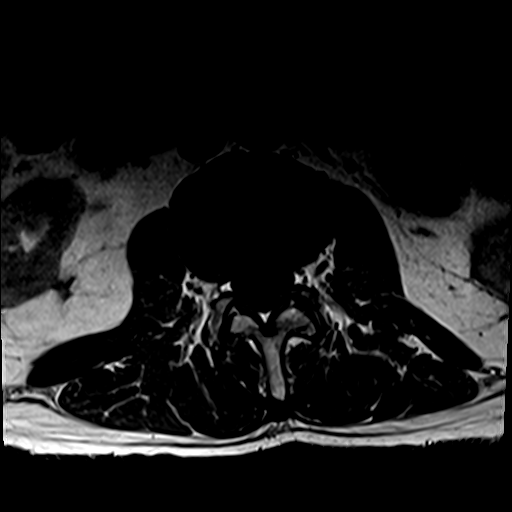
[im 31/31]
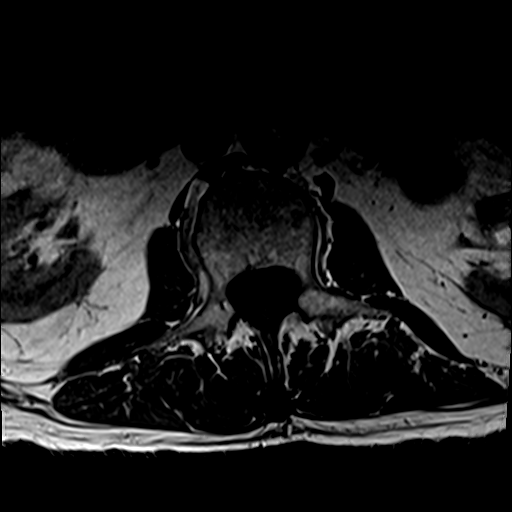

[Series 29: T1 fat-sat post-contrast · sagittal · 4.0mm · 1.02mm/px · 5 of 15 slices shown]
[im 1/15]
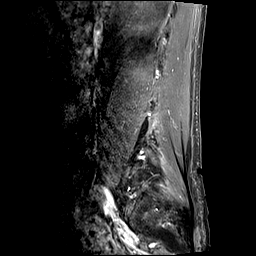
[im 4/15]
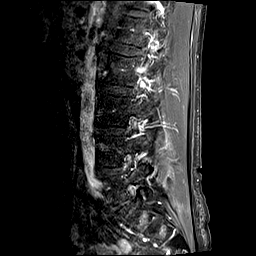
[im 8/15]
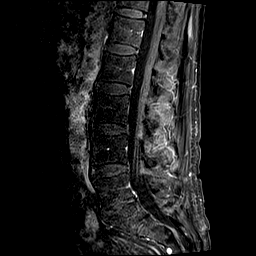
[im 11/15]
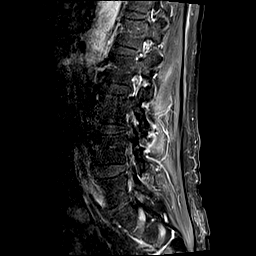
[im 15/15]
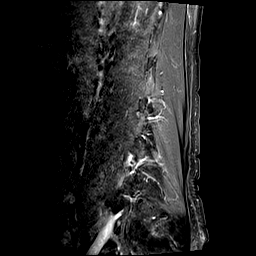

[33 of 48 positions shown; findings below may reference images not displayed]

FINDINGS: Segmentation:  Standard.

Alignment:  Physiologic.

Vertebrae:  No fracture, evidence of discitis, or bone lesion.

Conus medullaris and cauda equina: Conus extends to the T12 level.
Cauda equina appear normal. No abnormal contrast enhancement.

Paraspinal and other soft tissues: Negative.

Disc levels:

T12-L1: No spinal canal or neural foraminal stenosis.

L1-2: No spinal canal or neural foraminal stenosis.

L2-3: Mild facet degenerative changes. No spinal canal or neural
foraminal stenosis.

L3-4: Shallow disc bulge, moderate facet degenerative changes and
mild ligamentum flavum redundancy without significant spinal canal
or neural foraminal stenosis.

L4-5: Shallow disc bulge, moderate facet degenerative changes and
mild ligamentum flavum redundancy resulting in mild narrowing of the
bilateral subarticular zones. No significant neural foraminal
narrowing.

L5-S1: Loss of disc height, small central disc protrusion and mild
facet degenerative changes without significant spinal canal or
neural foraminal stenosis.
IMPRESSION: 1. Mild degenerative changes of the lumbar spine without high-grade
spinal canal or neural foraminal stenosis at any level.
2. Normal appearance of the cauda equina.

## 2021-01-15 MED ORDER — HYDROXYZINE HCL 25 MG PO TABS: 12.5000 mg | ORAL_TABLET | Freq: Three times a day (TID) | ORAL | 2 refills | Status: DC

## 2021-01-15 MED ORDER — SERTRALINE HCL 100 MG PO TABS: 150.0000 mg | ORAL_TABLET | Freq: Every morning | ORAL | 0 refills | Status: DC

## 2021-01-15 MED ORDER — RISPERIDONE 1 MG PO TABS: 1.0000 mg | ORAL_TABLET | Freq: Every day | ORAL | 0 refills | Status: DC

## 2021-01-15 MED ORDER — GADOBUTROL 1 MMOL/ML IV SOLN
7.0000 mL | Freq: Once | INTRAVENOUS | Status: AC | PRN
  Administered 2021-01-15: 7 mL via INTRAVENOUS

## 2021-01-15 NOTE — Telephone Encounter (Signed)
No worries, he has a fairly recent one.  Thanks  [employer]

## 2021-01-15 NOTE — Telephone Encounter (Signed)
Pt called in for the traZODone (DESYREL) 50 MG tablet Pt is not sleeping and this was discussed in visit today and pt did not get this medication.   [facility] in [place]   Please call when sent to the pharmacy please call [place] at [phone]

## 2021-01-15 NOTE — Telephone Encounter (Signed)
Damoni called and wanted Delfino Lovett to know he was seen today to send his Rxs to [place]  [id number]   Call back [phone]

## 2021-01-15 NOTE — Telephone Encounter (Signed)
Ammonia test was cancelled. I did not place the specimen on ice.

## 2021-01-15 NOTE — Patient Instructions (Addendum)
Nathan Hunt -  [name] to see you again  Due for recheck on liver - call the specialist at [phone] to set up appt with them at your earliest convenience. Nathan Hunt -  [name] to see you. Sorry you're still going through this.  Labs today looking at a wide variety of things - I'll call with results of any urgency  Not noted on this AVS is referrals - will work with [name] today to find most convenient location and soonest we can get you in  Overall a number of reassuring things, but obviously something is going on - will send to cardiology and neurology.  Can call liver specialist at [phone]. They are associated with an Oncology group. You do not have a cancer diagnosis - I just wanted to let you know in case their number shows oncology.  I have ordered STAT MRI of brain and spine. Hoping to get this done this week or early next week at [facility]  Thank you  Nathan Hunt      If you have lab work done today you will be contacted with your lab results within the next 2 weeks.  If you have not heard from [place] then please contact us. The fastest way to get your results is to register for My Chart.   IF you received an x-ray today, you will receive an invoice from [facility]. Please contact [facility] at [phone] with questions or concerns regarding your invoice.   IF you received labwork today, you will receive an invoice from [facility]. Please contact [facility] at [phone] with questions or concerns regarding your invoice.   Our billing staff will not be able to assist you with questions regarding bills from these companies.  You will be contacted with the lab results as soon as they are available. The fastest way to get your results is to activate your My Chart account. Instructions are located on the last page of this paperwork. If you have not heard from [place] regarding the results in 2 weeks, please contact this office.

## 2021-01-16 ENCOUNTER — Other Ambulatory Visit: Payer: Self-pay

## 2021-01-16 DIAGNOSIS — F32A Depression, unspecified: Secondary | ICD-10-CM

## 2021-01-16 DIAGNOSIS — G479 Sleep disorder, unspecified: Secondary | ICD-10-CM

## 2021-01-16 MED ORDER — TRAZODONE HCL 50 MG PO TABS: 25.0000 mg | ORAL_TABLET | Freq: Every evening | ORAL | 0 refills | Status: DC | PRN

## 2021-01-16 NOTE — Telephone Encounter (Signed)
The only medication that needed to be resent was the trazodone. Trazodone resent to [facility] in [place].

## 2021-01-16 NOTE — Telephone Encounter (Signed)
Request sent to provider for approval.  

## 2021-01-16 NOTE — Telephone Encounter (Signed)
Pt called in for the traZODone (DESYREL) 50 MG tablet Pt is not sleeping and this was discussed in visit today and pt did not get this medication.    [facility] in [place]    Please call when sent to the pharmacy please call [place] at [phone]    LFD 11/10/19 #90 with no refills LOV 01/15/21 NOV none

## 2021-01-16 NOTE — Telephone Encounter (Signed)
Hey can someone fix this, that was to be sent to [facility] in [place]

## 2021-01-16 NOTE — Telephone Encounter (Signed)
Patient medication was sent to the pharmacy 

## 2021-01-17 LAB — PATHOLOGIST SMEAR REVIEW

## 2021-01-17 LAB — CK TOTAL AND CKMB (NOT AT ARMC)
CK, MB: <0.7 ng/mL (ref 0–5.0)
Total CK: 33 U/L — ABNORMAL LOW (ref 44–196)

## 2021-01-17 LAB — ANA: Anti Nuclear Antibody (ANA): NEGATIVE

## 2021-02-04 ENCOUNTER — Telehealth: Payer: Self-pay

## 2021-02-04 NOTE — Telephone Encounter (Signed)
Pt brother also needs call regarding medication dispensing instruction there is a confusion on Medication  Also will he need a follow up appt with Delfino Lovett regarding the MRI

## 2021-02-04 NOTE — Telephone Encounter (Signed)
Left a voice mail message for patient's brother to call back with the medication their is confusion on. Patient calling on referral that were to be placed to neurologist, and possibly hepatologist which I don't see. Also he is inquiring if he needs a follow up appointment from MRI. Please advise.

## 2021-02-04 NOTE — Telephone Encounter (Signed)
Pt brother is calling about neurologist and liver specialist referrals and I do not see nothing was put in?   Brother call back [phone] Nigel Sloop)

## 2021-02-11 NOTE — Telephone Encounter (Signed)
Will double check on referral. No acute concerns on MRI or labs. I will want him to follow up with specialist.  Thanks  [name]

## 2021-02-13 NOTE — Telephone Encounter (Signed)
Attempted to call patient. Left an detailed message and told him to return the call if he needed anything.

## 2021-02-20 ENCOUNTER — Telehealth: Payer: Self-pay

## 2021-02-20 NOTE — Telephone Encounter (Signed)
Patient's brother called in stating that he has some concerns that are still unanswered from [name] last visit. They still have not heard from neurology. Wanting [name] or [name] to call him back to discuss further.   Nathan Hunt [phone]

## 2021-02-20 NOTE — Telephone Encounter (Signed)
Patient brother called in regards to patient imaging results. Pt continues to decline and is still getting lightheaded. Pt is scheduled for a video visit to discuss how to move forward.

## 2021-02-23 NOTE — Progress Notes (Signed)
Established Patient Office Visit  Subjective:  Patient ID: Nathan Hunt, male    DOB: Apr 20, 1951  Age: 70 y.o. MRN: [id number]  CC:  Chief Complaint  Patient presents with   Follow-up    Patient states he is following up since hospital visit. Patient is still getting light headed but is okay while sitting.    HPI Nathan Hunt presents for follow up   [facility] since recent hospitalization. Feeling fairly stable Still low appetite and dizziness with change in position. Seems to be orthostatic. He has had a number of referrals but unfortunately has not been able to get appts scheduled  Here with his brother today who is going to help coordinate these.  Notes weakness is ongoing. Easily fatigued.  Has continued to have abnormal LFTs without clear etiology.   Imaging has been fairly unremarkable.  No other specific symptoms.  Past Medical History:  Diagnosis Date   BPH (benign prostatic hyperplasia)    Depression    Hyperlipidemia    Hypertension    Insomnia    Tendonitis    Tinnitus     Past Surgical History:  Procedure Laterality Date   TONSILLECTOMY  1962    Family History  Problem Relation Age of Onset   Heart disease Mother    Heart disease Father    Heart disease Sister    Bone cancer Maternal Grandfather    Bone cancer Other    Alzheimer's disease Other    Colon cancer Neg Hx     Social History   Socioeconomic History   Marital status: Single    Spouse name: Not on file   Number of children: Not on file   Years of education: Not on file   Highest education level: Not on file  Occupational History   Not on file  Tobacco Use   Smoking status: Former    Types: Cigarettes   Smokeless tobacco: Never  Vaping Use   Vaping Use: Never used  Substance and Sexual Activity   Alcohol use: Yes    Alcohol/week: 0.0 standard drinks   Drug use: No   Sexual activity: Not on file  Other Topics Concern   Not on file  Social History Narrative   Not on  file   Social Determinants of Health   Financial Resource Strain: Not on file  Food Insecurity: Not on file  Transportation Needs: Not on file  Physical Activity: Not on file  Stress: Not on file  Social Connections: Not on file  Intimate Partner Violence: Not on file    Outpatient Medications Prior to Visit  Medication Sig Dispense Refill   cetirizine (ZYRTEC) 5 MG tablet Take 5 mg by mouth daily.     traZODone (DESYREL) 50 MG tablet Take 0.5-1 tablets (25-50 mg total) by mouth at bedtime as needed for sleep. 90 tablet 0   ondansetron (ZOFRAN) 4 MG tablet Take 1 tablet (4 mg total) by mouth 2 (two) times daily as needed for nausea (diarrhea, upset stomach). (Patient not taking: Reported on 01/15/2021) 20 tablet 0   hydrOXYzine (ATARAX/VISTARIL) 25 MG tablet Take 12.5-25 mg by mouth 3 (three) times daily.     risperiDONE (RISPERDAL) 1 MG tablet Take 1 mg by mouth at bedtime.     sertraline (ZOLOFT) 100 MG tablet Take 150 mg by mouth every morning.     No facility-administered medications prior to visit.    No Known Allergies  ROS Review of Systems Per hpi  Objective:    Physical Exam Constitutional:      General: He is not in acute distress.    Appearance: Normal appearance. He is normal weight. He is not ill-appearing, toxic-appearing or diaphoretic.  Cardiovascular:     Rate and Rhythm: Normal rate and regular rhythm.     Heart sounds: Normal heart sounds. No murmur heard.   No friction rub. No gallop.  Pulmonary:     Effort: Pulmonary effort is normal. No respiratory distress.     Breath sounds: Normal breath sounds. No stridor. No wheezing, rhonchi or rales.  Chest:     Chest wall: No tenderness.  Neurological:     General: No focal deficit present.     Mental Status: He is alert and oriented to person, place, and time. Mental status is at baseline.     GCS: GCS eye subscore is 4. GCS verbal subscore is 5. GCS motor subscore is 6.     Cranial Nerves: Cranial  nerves are intact.     Sensory: Sensation is intact. No sensory deficit.     Motor: Motor function is intact.     Coordination: Coordination is intact. Romberg sign negative. Coordination normal. Finger-Nose-Finger Test and Heel to [facility] Test normal. Rapid alternating movements normal.     Gait: Gait is intact.  Psychiatric:        Mood and Affect: Mood normal.        Behavior: Behavior normal.        Thought Content: Thought content normal.        Judgment: Judgment normal.    BP 129/89   Pulse 66   Temp 98.2 F (36.8 C) (Temporal)   Resp 16   Ht 5\' 10"  (1.778 m)   Wt 144 lb (65.3 kg)   SpO2 99%   BMI 20.66 kg/m  Wt Readings from Last 3 Encounters:  01/15/21 144 lb (65.3 kg)  09/12/20 150 lb (68 kg)  09/11/20 150 lb (68 kg)     Health Maintenance Due  Topic Date Due   COVID-19 Vaccine (3 - Booster) 12/26/2019   INFLUENZA VACCINE  12/30/2020    There are no preventive care reminders to display for this patient.  Lab Results  Component Value Date   TSH 2.51 01/15/2021   Lab Results  Component Value Date   WBC 6.8 01/15/2021   HGB 13.1 01/15/2021   HCT 39.2 01/15/2021   MCV 89.9 01/15/2021   PLT 201.0 01/15/2021   Lab Results  Component Value Date   NA 141 01/15/2021   K 3.5 01/15/2021   CO2 26 01/15/2021   GLUCOSE 108 (H) 01/15/2021   BUN 22 01/15/2021   CREATININE 1.00 01/15/2021   BILITOT 0.4 01/15/2021   ALKPHOS 66 01/15/2021   AST 14 01/15/2021   ALT 13 01/15/2021   PROT 6.3 01/15/2021   ALBUMIN 4.0 01/15/2021   CALCIUM 9.0 01/15/2021   GFR 76.25 01/15/2021   Lab Results  Component Value Date   CHOL 223 (H) 01/15/2021   Lab Results  Component Value Date   HDL 35.50 (L) 01/15/2021   Lab Results  Component Value Date   LDLCALC 157 (H) 01/15/2021   Lab Results  Component Value Date   TRIG 149.0 01/15/2021   Lab Results  Component Value Date   CHOLHDL 6 01/15/2021   Lab Results  Component Value Date   HGBA1C 5.3 01/15/2021       Assessment & Plan:   Problem List Items Addressed This  Visit       Other   Depression   Hyperlipidemia   Relevant Orders   Lipid panel (Completed)   MR CERVICAL SPINE W WO CONTRAST (Completed)   MR THORACIC SPINE W WO CONTRAST (Completed)   MR Lumbar Spine W Wo Contrast (Completed)   MR Brain W Wo Contrast (Completed)   Other Visit Diagnoses     Rapidly progressive weakness    -  Primary   Relevant Orders   CBC with Differential/Platelet (Completed)   Comprehensive metabolic panel (Completed)   Lipid panel (Completed)   TSH (Completed)   CK Total (and CKMB) (Completed)   Antinuclear Antib (ANA) (Completed)   C-reactive protein (Completed)   Sedimentation rate (Completed)   Ammonia   Urinalysis, Routine w reflex microscopic (Completed)   Pathologist smear review (Completed)   MR CERVICAL SPINE W WO CONTRAST (Completed)   MR THORACIC SPINE W WO CONTRAST (Completed)   MR Lumbar Spine W Wo Contrast (Completed)   MR Brain W Wo Contrast (Completed)   DOE (dyspnea on exertion)       Relevant Orders   TSH (Completed)   MR CERVICAL SPINE W WO CONTRAST (Completed)   MR THORACIC SPINE W WO CONTRAST (Completed)   MR Lumbar Spine W Wo Contrast (Completed)   MR Brain W Wo Contrast (Completed)   Abnormal LFTs       Relevant Orders   Comprehensive metabolic panel (Completed)   CK Total (and CKMB) (Completed)   Ammonia   Urinalysis, Routine w reflex microscopic (Completed)   MR CERVICAL SPINE W WO CONTRAST (Completed)   MR THORACIC SPINE W WO CONTRAST (Completed)   MR Lumbar Spine W Wo Contrast (Completed)   MR Brain W Wo Contrast (Completed)   Motor neuron disease ([place])       Relevant Orders   MR CERVICAL SPINE W WO CONTRAST (Completed)   MR THORACIC SPINE W WO CONTRAST (Completed)   MR Lumbar Spine W Wo Contrast (Completed)   History of elevated glucose       Relevant Orders   Hemoglobin A1c (Completed)   Screening PSA (prostate specific antigen)       Relevant Orders    PSA, Medicare ( [place] only) (Completed)       Meds ordered this encounter  Medications   DISCONTD: sertraline (ZOLOFT) 100 MG tablet    Sig: Take 1.5 tablets (150 mg total) by mouth every morning.    Dispense:  135 tablet    Refill:  0    Order Specific Question:   Supervising Provider    Answer:   Carlota Raspberry, JEFFREY R [[id number]]   DISCONTD: hydrOXYzine (ATARAX/VISTARIL) 25 MG tablet    Sig: Take 0.5-1 tablets (12.5-25 mg total) by mouth 3 (three) times daily.    Dispense:  30 tablet    Refill:  2    Order Specific Question:   Supervising Provider    Answer:   Carlota Raspberry, JEFFREY R [[id number]]   DISCONTD: risperiDONE (RISPERDAL) 1 MG tablet    Sig: Take 1 tablet (1 mg total) by mouth at bedtime.    Dispense:  90 tablet    Refill:  0    Order Specific Question:   Supervising Provider    Answer:   Carlota Raspberry, JEFFREY R [[id number]]    Follow-up: Return if symptoms worsen or fail to improve.   PLAN Continue sertraline, risperidone, hydroxyzine as above. Labs collected. Will follow up with the patient as warranted. Will order imaging of spine  to monitor for CNS disease Have given information on past referrals placed. Patient encouraged to call clinic with any questions, comments, or concerns.  Maximiano Coss, NP

## 2021-02-24 ENCOUNTER — Other Ambulatory Visit: Payer: Self-pay | Admitting: Registered Nurse

## 2021-02-24 DIAGNOSIS — R531 Weakness: Secondary | ICD-10-CM

## 2021-02-24 NOTE — Telephone Encounter (Signed)
Can call patient/brother Nathan Hunt [phone]).  Imaging fairly benign. Some degenerative changes but nothing that would cause his symptoms. I have placed referral to rheumatology today with thoughts that he may have some autoimmune disorder with abnormal presentation. In addition, I would encourage him to contact hematology, who has reached out any not received an answer from him.   Thanks,  [name]

## 2021-02-25 ENCOUNTER — Encounter: Payer: Self-pay | Admitting: Registered Nurse

## 2021-02-25 ENCOUNTER — Telehealth (INDEPENDENT_AMBULATORY_CARE_PROVIDER_SITE_OTHER): Payer: Medicare Other | Admitting: Registered Nurse

## 2021-02-25 ENCOUNTER — Other Ambulatory Visit: Payer: Self-pay

## 2021-02-25 DIAGNOSIS — Z789 Other specified health status: Secondary | ICD-10-CM | POA: Diagnosis not present

## 2021-02-25 DIAGNOSIS — F209 Schizophrenia, unspecified: Secondary | ICD-10-CM | POA: Diagnosis not present

## 2021-02-25 DIAGNOSIS — R531 Weakness: Secondary | ICD-10-CM | POA: Diagnosis not present

## 2021-02-25 DIAGNOSIS — F32A Depression, unspecified: Secondary | ICD-10-CM

## 2021-02-25 NOTE — Progress Notes (Signed)
Telemedicine Encounter- SOAP NOTE Established Patient  This telephone encounter was conducted with the patient's (or proxy's) verbal consent via audio telecommunications: yes   Patient was instructed to have this encounter in a suitably private space; and to only have persons present to whom they give permission to participate. In addition, patient identity was confirmed by use of name plus two identifiers (DOB and address).  I discussed the limitations, risks, security and privacy concerns of performing an evaluation and management service by telephone and the availability of in person appointments. I also discussed with the patient that there may be a patient responsible charge related to this service. The patient expressed understanding and agreed to proceed.  I spent a total of 48 minutes talking with the patient or their proxy.  Patient at home Provider in office  Participants: Nathan Ruddy, NP and Nathan Hunt  Chief Complaint  Patient presents with   discuss imaging results    Subjective   Nathan Hunt is a 70 y.o. established patient. Telephone visit today for follow up   HPI Visit today facilitated through brother Nathan Hunt.  Ongoing symptoms Notes cognitive decline, progressive weakness, decline in mental health.  Review of recent labs and imaging show no acute changes. Low CK on last labs to 33.   Nathan Hunt today notes that visiting [name]'s home has presented some concerns - he is worried about potential alcohol consumption. Nathan Hunt has denied alcohol use to me in previous visits. Will revisit this with him as it may be a large contributor to symptoms. In addition, there is some concern from Nathan Hunt about Nathan Hunt taking care of himself - a concern that stems out of worry for mental health, as Nathan Hunt reports Nathan Hunt has endorsed hopelessness.  Nathan Hunt has had a long history of poor mental health, receiving dx of schizophrenia, depression, and anxiety. He is managed through  [facility] but unclear on compliance with medications, compliance with follow up visits.  This is further complicated by multiple hospitalizations for acute encephalitis thought to be secondary to heavy etoh intake - though as noted above, Nathan Hunt has denied using alcohol.   Finally, there is concern on outside imaging for hx of lacunar infarct. His MRI that we obtained through our office in August suggests chronic angiopathy. Unclear the extent to which this is contributing to his symptoms.   He had been referred to Nathan Hunt within the past year but unfortunately has not been able to make an appt.  Reviewed family history with Nathan Hunt on the phone today.   Patient Active Problem List   Diagnosis Date Noted   Aortic atherosclerosis ([place]) 09/16/2020   Constipation 09/12/2019   Bipolar disorder with severe mania ([place]) 09/10/2019   Dyslipidemia 09/10/2019   Seasonal allergies 09/10/2019   Schizophrenia ([place]) 09/09/2019   Insomnia 09/06/2015   Acute encephalopathy 08/29/2015   Increased anion gap metabolic acidosis 32/95/1884   Tachycardia 08/29/2015   Allergic rhinitis due to pollen 03/25/2015   Hyperlipidemia 03/25/2015   Tinnitus of both ears 09/19/2012   Depression 09/19/2012   BPH (benign prostatic hyperplasia) 09/19/2012    Past Medical History:  Diagnosis Date   BPH (benign prostatic hyperplasia)    Depression    Hyperlipidemia    Hypertension    Insomnia    Tendonitis    Tinnitus     Current Outpatient Medications  Medication Sig Dispense Refill   cetirizine (ZYRTEC) 5 MG tablet Take 5 mg by mouth daily.  hydrOXYzine (ATARAX/VISTARIL) 25 MG tablet Take 0.5-1 tablets (12.5-25 mg total) by mouth 3 (three) times daily. 30 tablet 2   risperiDONE (RISPERDAL) 1 MG tablet Take 1 tablet (1 mg total) by mouth at bedtime. 90 tablet 0   sertraline (ZOLOFT) 100 MG tablet Take 1.5 tablets (150 mg total) by mouth every morning. 135 tablet 0   traZODone (DESYREL)  50 MG tablet Take 0.5-1 tablets (25-50 mg total) by mouth at bedtime as needed for sleep. 90 tablet 0   ondansetron (ZOFRAN) 4 MG tablet Take 1 tablet (4 mg total) by mouth 2 (two) times daily as needed for nausea (diarrhea, upset stomach). (Patient not taking: No sig reported) 20 tablet 0   No current facility-administered medications for this visit.    No Known Allergies  Social History   Socioeconomic History   Marital status: Single    Spouse name: Not on file   Number of children: Not on file   Years of education: Not on file   Highest education level: Not on file  Occupational History   Not on file  Tobacco Use   Smoking status: Former    Types: Cigarettes   Smokeless tobacco: Never  Vaping Use   Vaping Use: Never used  Substance and Sexual Activity   Alcohol use: Yes    Alcohol/week: 0.0 standard drinks   Drug use: No   Sexual activity: Not on file  Other Topics Concern   Not on file  Social History Narrative   Not on file   Social Determinants of Health   Financial Resource Strain: Not on file  Food Insecurity: Not on file  Transportation Needs: Not on file  Physical Activity: Not on file  Stress: Not on file  Social Connections: Not on file  Intimate Partner Violence: Not on file    ROS Per hpi   Objective   Vitals as reported by the patient: There were no vitals filed for this visit.  [name] was seen today for discuss imaging results.  Diagnoses and all orders for this visit:  Deficit in activities of daily living (ADL) -     Ambulatory referral to Neuropsychology -     Ambulatory referral to Social Work  Rapidly progressive weakness -     Ambulatory referral to Neuropsychology -     Ambulatory referral to Social Work  Depression, unspecified depression type -     Ambulatory referral to Neuropsychology  Schizophrenia, unspecified type [facility]) -     Ambulatory referral to Neuropsychology   PLAN Will provide both Nathan Hunt and Nathan Hunt with info  for [facility]. Unfortunately after initial review, Rheumatology is not sure that a low CK is evidence enough for rheum work up before he sees neuro. Agree with this plan. Pt would benefit from neuropsych work up and assessment of efficacy of his mental health regimen. Will refer to neuropsych work up.  Continue to discuss lifestyle factors. Concern that etoh and malnutrition may play large parts in patient's symptoms. Will discuss these concerns one on one with patient Referral to social services to investigate resources for patient given his weakness and trouble with ADLs. I do think he would be a good candidate for home health if he is agreeable, but determining etiology of symptoms is priority at this time. Patient encouraged to call clinic with any questions, comments, or concerns.  I discussed the assessment and treatment plan with the patient. The patient was provided an opportunity to ask questions and all were answered. The patient  agreed with the plan and demonstrated an understanding of the instructions.   The patient was advised to call back or seek an in-person evaluation if the symptoms worsen or if the condition fails to improve as anticipated.  I provided 48 minutes of non-face-to-face time during this encounter.  Maximiano Coss, NP

## 2021-02-25 NOTE — Progress Notes (Signed)
I connected with  Nathan Hunt on 02/25/21 by a video enabled telemedicine application and verified that I am speaking with the correct person using two identifiers.   I discussed the limitations of evaluation and management by telemedicine. The patient expressed understanding and agreed to proceed.

## 2021-03-04 ENCOUNTER — Telehealth: Payer: Self-pay | Admitting: Registered Nurse

## 2021-03-04 NOTE — Telephone Encounter (Signed)
Please call patients brother and advise that he is not on DPR and information cannot be released to him. Encourage patient calling office or person on DPR.

## 2021-03-04 NOTE — Telephone Encounter (Signed)
Patients brother is calling to check on the 2 referrals that Richard placed for him.  Please advise

## 2021-03-05 NOTE — Telephone Encounter (Signed)
Patient's brother is his healthcare power of attorney - We have this doctument scanned in under documents.

## 2021-03-05 NOTE — Telephone Encounter (Signed)
Pt brother is returning phone call to [place]

## 2021-03-05 NOTE — Telephone Encounter (Signed)
Placed a call to both brother and patient and no answer and vm was full. HPOA for patient have been scanned into our system per [place] front office staff.

## 2021-03-05 NOTE — Telephone Encounter (Signed)
Richard, upon checking on pts referrals, they were all denied for different reasons. Social work was denied because of not being in pt network. Psychology was denied because all they see are children and adolescents. Then Rheumatology was denied because provider feels that its a neurology issue. Please advise and I will get with the referral coordinator and figure out how to navigate these referrals.

## 2021-03-17 NOTE — Telephone Encounter (Signed)
Have discussed with patient  Thank you  [name]

## 2021-04-02 ENCOUNTER — Telehealth: Payer: Self-pay | Admitting: Registered Nurse

## 2021-04-02 NOTE — Telephone Encounter (Signed)
..  Caller name:  Min Collymore  On DPR? :yes/no: Yes  Call back number:[phone]  Provider they see: M[facility]  Reason for call: Patients brother states that patient is still having trouble and needs to know what can be done.

## 2021-04-06 ENCOUNTER — Other Ambulatory Visit: Payer: Self-pay | Admitting: Registered Nurse

## 2021-04-06 DIAGNOSIS — F209 Schizophrenia, unspecified: Secondary | ICD-10-CM

## 2021-04-06 DIAGNOSIS — F32A Depression, unspecified: Secondary | ICD-10-CM

## 2021-04-06 DIAGNOSIS — Z789 Other specified health status: Secondary | ICD-10-CM

## 2021-04-06 DIAGNOSIS — R531 Weakness: Secondary | ICD-10-CM

## 2021-04-07 NOTE — Telephone Encounter (Signed)
Left message for patient letting him know that he should be hearing something soon.

## 2021-04-08 ENCOUNTER — Other Ambulatory Visit: Payer: Self-pay | Admitting: Registered Nurse

## 2021-04-08 DIAGNOSIS — G479 Sleep disorder, unspecified: Secondary | ICD-10-CM

## 2021-06-05 ENCOUNTER — Other Ambulatory Visit: Payer: Self-pay | Admitting: Registered Nurse

## 2021-06-05 DIAGNOSIS — G479 Sleep disorder, unspecified: Secondary | ICD-10-CM

## 2021-06-05 NOTE — Telephone Encounter (Signed)
Patient is requesting a refill of the following medications: Requested Prescriptions   Pending Prescriptions Disp Refills   traZODone (DESYREL) 50 MG tablet [Pharmacy Med Name: TRAZODONE 50MG  TABLETS] 90 tablet 0    Sig: TAKE 1/2 TO 1 TABLET(25 TO 50 MG) BY MOUTH AT BEDTIME AS NEEDED FOR SLEEP    Date of patient request: 06/05/21 Last office visit: 02/25/21 Date of last refill: 04/08/21 Last refill amount: 90

## 2021-07-09 ENCOUNTER — Ambulatory Visit: Payer: Medicare Other | Admitting: Registered Nurse

## 2021-07-16 ENCOUNTER — Ambulatory Visit (INDEPENDENT_AMBULATORY_CARE_PROVIDER_SITE_OTHER): Payer: Medicare Other | Admitting: Registered Nurse

## 2021-07-16 ENCOUNTER — Encounter: Payer: Self-pay | Admitting: Registered Nurse

## 2021-07-16 ENCOUNTER — Other Ambulatory Visit: Payer: Self-pay | Admitting: Registered Nurse

## 2021-07-16 ENCOUNTER — Ambulatory Visit
Admission: RE | Admit: 2021-07-16 | Discharge: 2021-07-16 | Disposition: A | Discharge status: Home / Self Care | Payer: Medicare Other | Source: Ambulatory Visit | Attending: Registered Nurse | Admitting: Registered Nurse

## 2021-07-16 ENCOUNTER — Other Ambulatory Visit: Payer: Self-pay

## 2021-07-16 VITALS — BP 98/60 | HR 93 | Temp 98.0°F | Wt 155.0 lb

## 2021-07-16 DIAGNOSIS — Z8639 Personal history of other endocrine, nutritional and metabolic disease: Secondary | ICD-10-CM | POA: Diagnosis not present

## 2021-07-16 DIAGNOSIS — F32A Depression, unspecified: Secondary | ICD-10-CM

## 2021-07-16 DIAGNOSIS — G479 Sleep disorder, unspecified: Secondary | ICD-10-CM | POA: Diagnosis not present

## 2021-07-16 DIAGNOSIS — E441 Mild protein-calorie malnutrition: Secondary | ICD-10-CM | POA: Diagnosis not present

## 2021-07-16 DIAGNOSIS — M25561 Pain in right knee: Secondary | ICD-10-CM

## 2021-07-16 DIAGNOSIS — R531 Weakness: Secondary | ICD-10-CM | POA: Diagnosis not present

## 2021-07-16 DIAGNOSIS — E539 Vitamin B deficiency, unspecified: Secondary | ICD-10-CM

## 2021-07-16 DIAGNOSIS — S82001A Unspecified fracture of right patella, initial encounter for closed fracture: Secondary | ICD-10-CM

## 2021-07-16 DIAGNOSIS — R6889 Other general symptoms and signs: Secondary | ICD-10-CM

## 2021-07-16 IMAGING — CR DG KNEE COMPLETE 4+V*R*
4 series · 4 of 4 positions shown · non-contrast
Comparison: None.

CLINICAL DATA: Fall, pain

EXAM:
RIGHT KNEE - COMPLETE 4+ VIEW

[w knee ap right]
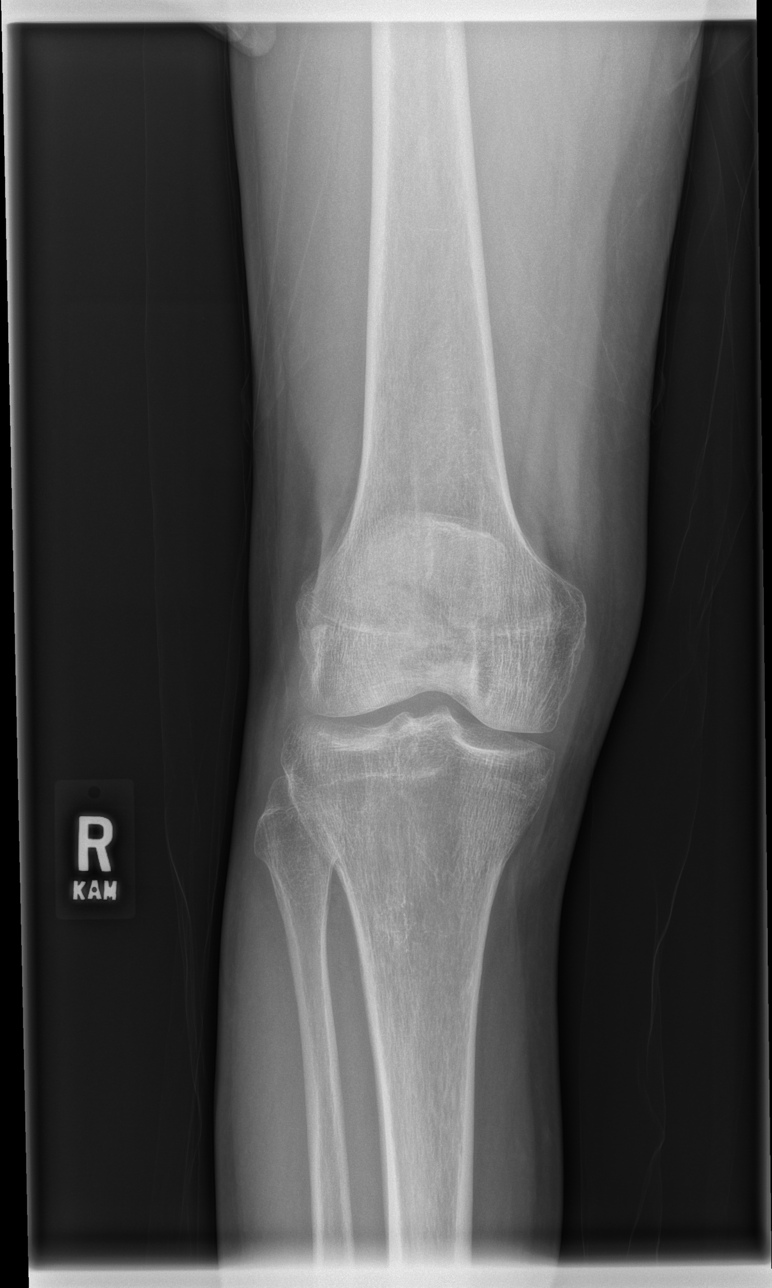

[w knee lat right]
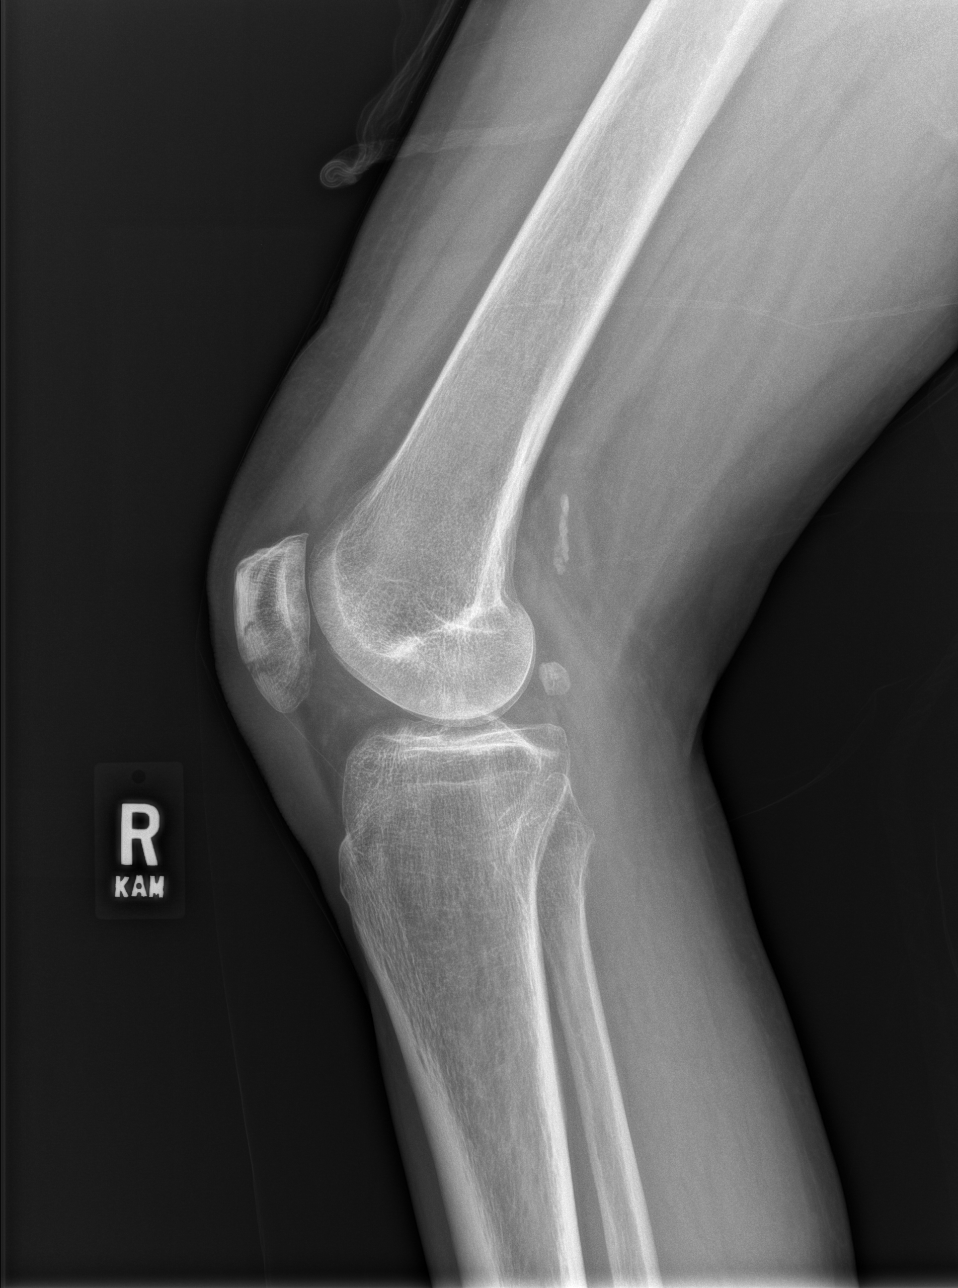

[w knee tunnel pa right]
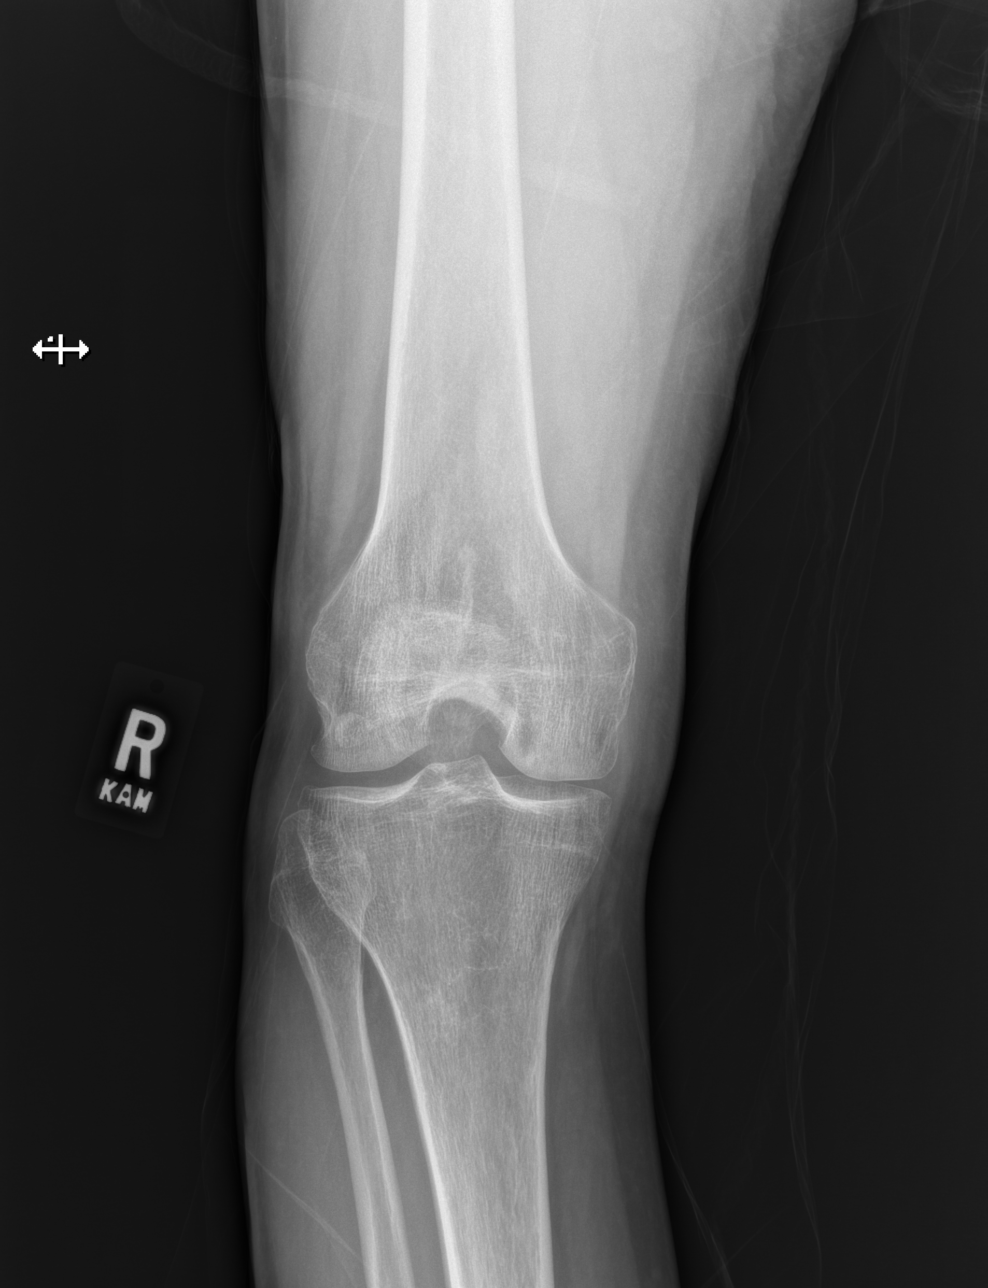

[x knee sunrise right]
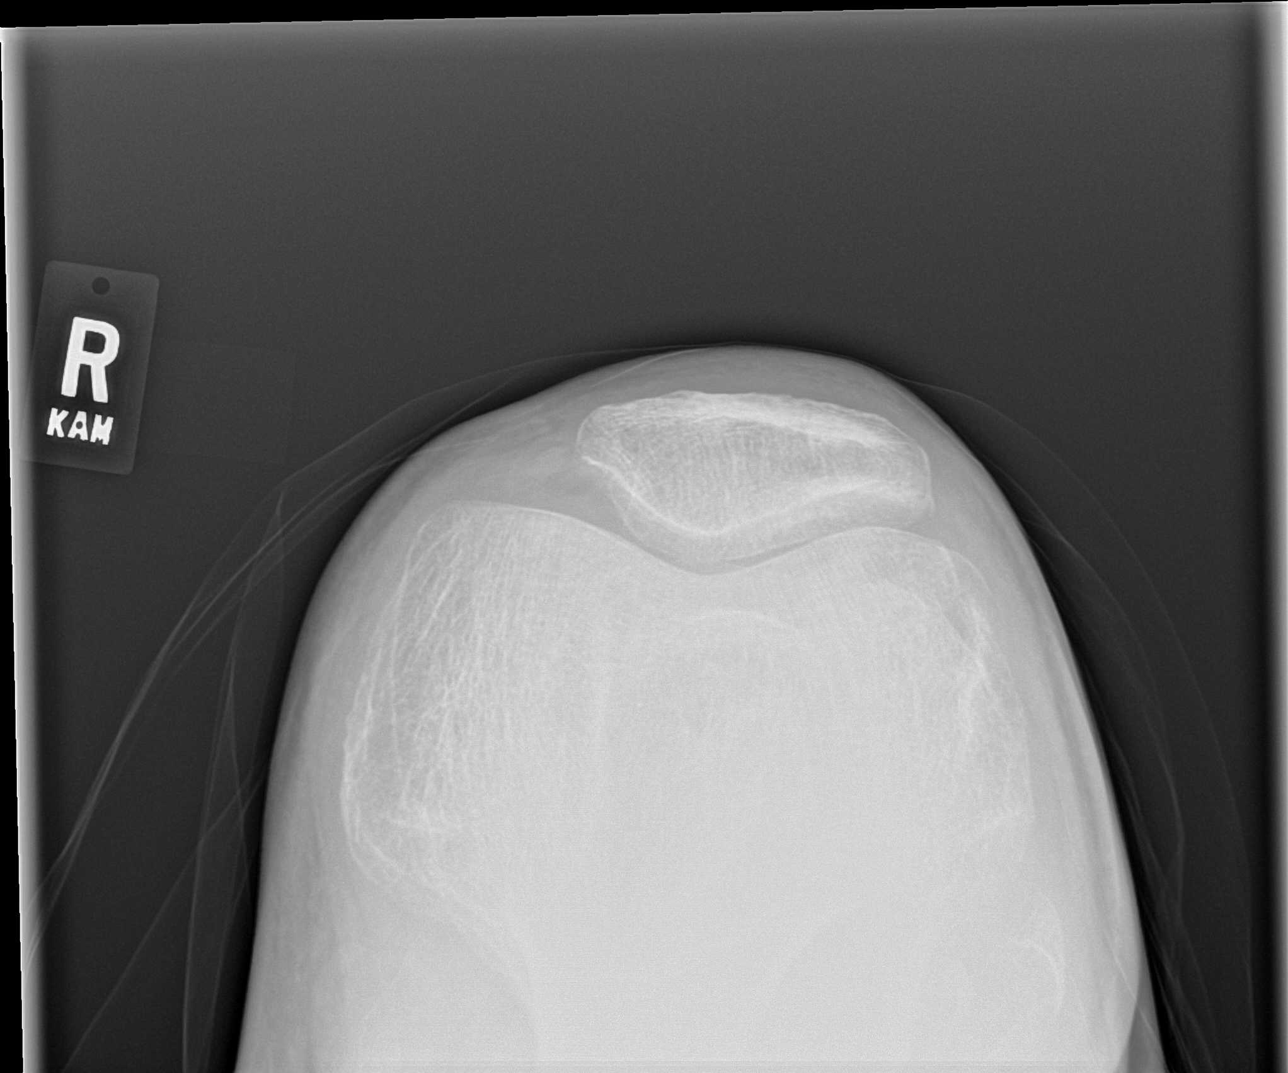

[4 of 4 positions shown; findings below may reference images not displayed]

FINDINGS: There is a nondisplaced patella fracture. Adjacent soft tissue
swelling. Trace joint effusion. Vascular calcifications.
IMPRESSION: Nondisplaced patella fracture with adjacent soft tissue swelling and
trace joint effusion.

## 2021-07-16 MED ORDER — TRAZODONE HCL 50 MG PO TABS: ORAL_TABLET | ORAL | 0 refills | Status: DC

## 2021-07-16 MED ORDER — RISPERIDONE 1 MG PO TABS: 1.0000 mg | ORAL_TABLET | Freq: Every day | ORAL | 0 refills | Status: DC

## 2021-07-16 MED ORDER — SERTRALINE HCL 100 MG PO TABS: 150.0000 mg | ORAL_TABLET | Freq: Every morning | ORAL | 0 refills | Status: DC

## 2021-07-16 MED ORDER — TRAZODONE HCL 150 MG PO TABS: 75.0000 mg | ORAL_TABLET | Freq: Every day | ORAL | 1 refills | Status: DC

## 2021-07-16 MED ORDER — HYDROXYZINE HCL 25 MG PO TABS: 12.5000 mg | ORAL_TABLET | Freq: Three times a day (TID) | ORAL | 2 refills | Status: DC

## 2021-07-16 MED ORDER — TRAMADOL HCL 50 MG PO TABS: 50.0000 mg | ORAL_TABLET | Freq: Three times a day (TID) | ORAL | 0 refills | Status: AC | PRN
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
## 2021-07-16 NOTE — Patient Instructions (Addendum)
Nathan Hunt -  [name] to see you  Let's get Xray on knee. Can do that today.  Dr. Wells Guiles Tat has called to try to get in touch - call her office back at [phone]  Call Dr. Hazle Coca at [phone] for the neuropsych evaluation.  I will send over a moderate pain reliever to use three times daily as needed.   Depending on xray results, will consider orthopedic evaluation.  I have refilled all meds  I have sent referral to in home health  Thank you  [name]

## 2021-07-17 LAB — COMPREHENSIVE METABOLIC PANEL
ALT: 26 U/L (ref 0–53)
AST: 25 U/L (ref 0–37)
Albumin: 4.1 g/dL (ref 3.5–5.2)
Alkaline Phosphatase: 90 U/L (ref 39–117)
BUN: 19 mg/dL (ref 6–23)
CO2: 34 mEq/L — ABNORMAL HIGH (ref 19–32)
Calcium: 9.1 mg/dL (ref 8.4–10.5)
Chloride: 106 mEq/L (ref 96–112)
Creatinine, Ser: 1.09 mg/dL (ref 0.40–1.50)
GFR: 68.51 mL/min (ref >60.00)
Glucose, Bld: 77 mg/dL (ref 70–99)
Potassium: 3.8 mEq/L (ref 3.5–5.1)
Sodium: 142 mEq/L (ref 135–145)
Total Bilirubin: 0.4 mg/dL (ref 0.2–1.2)
Total Protein: 6.7 g/dL (ref 6.0–8.3)

## 2021-07-17 LAB — TSH: TSH: 3.03 u[IU]/mL (ref 0.35–5.50)

## 2021-07-17 LAB — LIPID PANEL
Cholesterol: 215 mg/dL — ABNORMAL HIGH (ref 0–200)
HDL: 38.7 mg/dL — ABNORMAL LOW (ref >39.00)
LDL Cholesterol: 139 mg/dL — ABNORMAL HIGH (ref 0–99)
NonHDL: 176.38
Total CHOL/HDL Ratio: 6
Triglycerides: 187 mg/dL — ABNORMAL HIGH (ref 0.0–149.0)
VLDL: 37.4 mg/dL (ref 0.0–40.0)

## 2021-07-17 LAB — CBC WITH DIFFERENTIAL/PLATELET
Basophils Absolute: 0.1 10*3/uL (ref 0.0–0.1)
Basophils Relative: 0.7 % (ref 0.0–3.0)
Eosinophils Absolute: 0.1 10*3/uL (ref 0.0–0.7)
Eosinophils Relative: 0.8 % (ref 0.0–5.0)
HCT: 40.5 % (ref 39.0–52.0)
Hemoglobin: 13.4 g/dL (ref 13.0–17.0)
Lymphocytes Relative: 20 % (ref 12.0–46.0)
Lymphs Abs: 1.5 10*3/uL (ref 0.7–4.0)
MCHC: 33.2 g/dL (ref 30.0–36.0)
MCV: 92.6 fl (ref 78.0–100.0)
Monocytes Absolute: 0.5 10*3/uL (ref 0.1–1.0)
Monocytes Relative: 7 % (ref 3.0–12.0)
Neutro Abs: 5.5 10*3/uL (ref 1.4–7.7)
Neutrophils Relative %: 71.5 % (ref 43.0–77.0)
Platelets: 216 10*3/uL (ref 150.0–400.0)
RBC: 4.37 Mil/uL (ref 4.22–5.81)
RDW: 14.7 % (ref 11.5–15.5)
WBC: 7.7 10*3/uL (ref 4.0–10.5)

## 2021-07-17 LAB — B12 AND FOLATE PANEL
Folate: 8.9 ng/mL (ref >5.9)
Vitamin B-12: 212 pg/mL (ref 211–911)

## 2021-07-17 LAB — HEMOGLOBIN A1C: Hgb A1c MFr Bld: 5.1 % (ref 4.6–6.5)

## 2021-07-21 LAB — VITAMIN B1

## 2021-09-03 ENCOUNTER — Encounter: Payer: Self-pay | Admitting: Registered Nurse

## 2021-10-24 ENCOUNTER — Telehealth: Payer: Self-pay

## 2021-10-24 NOTE — Telephone Encounter (Signed)
ERROR

## 2021-10-28 ENCOUNTER — Ambulatory Visit
Admission: RE | Admit: 2021-10-28 | Discharge: 2021-10-28 | Disposition: A | Discharge status: Home / Self Care | Payer: Medicare Other | Source: Ambulatory Visit | Attending: Registered Nurse | Admitting: Registered Nurse

## 2021-10-28 ENCOUNTER — Other Ambulatory Visit: Payer: Self-pay

## 2021-10-28 ENCOUNTER — Telehealth: Payer: Self-pay | Admitting: Registered Nurse

## 2021-10-28 ENCOUNTER — Ambulatory Visit (INDEPENDENT_AMBULATORY_CARE_PROVIDER_SITE_OTHER): Payer: Medicare Other | Admitting: Registered Nurse

## 2021-10-28 ENCOUNTER — Encounter: Payer: Self-pay | Admitting: Registered Nurse

## 2021-10-28 VITALS — BP 122/78 | HR 86 | Temp 98.0°F | Resp 18 | Ht 70.0 in | Wt 147.8 lb

## 2021-10-28 DIAGNOSIS — E539 Vitamin B deficiency, unspecified: Secondary | ICD-10-CM | POA: Diagnosis not present

## 2021-10-28 DIAGNOSIS — S82001A Unspecified fracture of right patella, initial encounter for closed fracture: Secondary | ICD-10-CM

## 2021-10-28 DIAGNOSIS — R531 Weakness: Secondary | ICD-10-CM

## 2021-10-28 DIAGNOSIS — F32A Depression, unspecified: Secondary | ICD-10-CM

## 2021-10-28 DIAGNOSIS — G479 Sleep disorder, unspecified: Secondary | ICD-10-CM

## 2021-10-28 DIAGNOSIS — R7989 Other specified abnormal findings of blood chemistry: Secondary | ICD-10-CM | POA: Diagnosis not present

## 2021-10-28 DIAGNOSIS — E441 Mild protein-calorie malnutrition: Secondary | ICD-10-CM

## 2021-10-28 DIAGNOSIS — Z8639 Personal history of other endocrine, nutritional and metabolic disease: Secondary | ICD-10-CM

## 2021-10-28 DIAGNOSIS — E559 Vitamin D deficiency, unspecified: Secondary | ICD-10-CM | POA: Diagnosis not present

## 2021-10-28 DIAGNOSIS — R42 Dizziness and giddiness: Secondary | ICD-10-CM | POA: Insufficient documentation

## 2021-10-28 DIAGNOSIS — R6889 Other general symptoms and signs: Secondary | ICD-10-CM

## 2021-10-28 LAB — COMPREHENSIVE METABOLIC PANEL
ALT: 23 U/L (ref 0–53)
AST: 40 U/L — ABNORMAL HIGH (ref 0–37)
Albumin: 4.2 g/dL (ref 3.5–5.2)
Alkaline Phosphatase: 90 U/L (ref 39–117)
BUN: 19 mg/dL (ref 6–23)
CO2: 28 mEq/L (ref 19–32)
Calcium: 8.9 mg/dL (ref 8.4–10.5)
Chloride: 106 mEq/L (ref 96–112)
Creatinine, Ser: 1.07 mg/dL (ref 0.40–1.50)
GFR: 69.91 mL/min (ref >60.00)
Glucose, Bld: 101 mg/dL — ABNORMAL HIGH (ref 70–99)
Potassium: 3.5 mEq/L (ref 3.5–5.1)
Sodium: 144 mEq/L (ref 135–145)
Total Bilirubin: 0.4 mg/dL (ref 0.2–1.2)
Total Protein: 6.5 g/dL (ref 6.0–8.3)

## 2021-10-28 LAB — LIPID PANEL
Cholesterol: 209 mg/dL — ABNORMAL HIGH (ref 0–200)
HDL: 35.6 mg/dL — ABNORMAL LOW (ref >39.00)
LDL Cholesterol: 151 mg/dL — ABNORMAL HIGH (ref 0–99)
NonHDL: 173.4
Total CHOL/HDL Ratio: 6
Triglycerides: 111 mg/dL (ref 0.0–149.0)
VLDL: 22.2 mg/dL (ref 0.0–40.0)

## 2021-10-28 LAB — B12 AND FOLATE PANEL
Folate: 10.3 ng/mL (ref >5.9)
Vitamin B-12: 284 pg/mL (ref 211–911)

## 2021-10-28 LAB — CBC WITH DIFFERENTIAL/PLATELET
Basophils Absolute: 0.1 10*3/uL (ref 0.0–0.1)
Basophils Relative: 0.9 % (ref 0.0–3.0)
Eosinophils Absolute: 0 10*3/uL (ref 0.0–0.7)
Eosinophils Relative: 0.2 % (ref 0.0–5.0)
HCT: 40.5 % (ref 39.0–52.0)
Hemoglobin: 13.7 g/dL (ref 13.0–17.0)
Lymphocytes Relative: 15.8 % (ref 12.0–46.0)
Lymphs Abs: 1.5 10*3/uL (ref 0.7–4.0)
MCHC: 33.9 g/dL (ref 30.0–36.0)
MCV: 90.4 fl (ref 78.0–100.0)
Monocytes Absolute: 0.8 10*3/uL (ref 0.1–1.0)
Monocytes Relative: 8.8 % (ref 3.0–12.0)
Neutro Abs: 7 10*3/uL (ref 1.4–7.7)
Neutrophils Relative %: 74.3 % (ref 43.0–77.0)
Platelets: 247 10*3/uL (ref 150.0–400.0)
RBC: 4.48 Mil/uL (ref 4.22–5.81)
RDW: 14.7 % (ref 11.5–15.5)
WBC: 9.4 10*3/uL (ref 4.0–10.5)

## 2021-10-28 LAB — VITAMIN D 25 HYDROXY (VIT D DEFICIENCY, FRACTURES): VITD: 14.08 ng/mL — ABNORMAL LOW (ref 30.00–100.00)

## 2021-10-28 LAB — TSH: TSH: 2.81 u[IU]/mL (ref 0.35–5.50)

## 2021-10-28 LAB — HEMOGLOBIN A1C: Hgb A1c MFr Bld: 5.2 % (ref 4.6–6.5)

## 2021-10-28 MED ORDER — RISPERIDONE 1 MG PO TABS: 1.0000 mg | ORAL_TABLET | Freq: Every day | ORAL | 1 refills | Status: DC

## 2021-10-28 MED ORDER — ZOLPIDEM TARTRATE 5 MG PO TABS: 5.0000 mg | ORAL_TABLET | Freq: Every evening | ORAL | 0 refills | Status: DC | PRN

## 2021-10-28 MED ORDER — SERTRALINE HCL 100 MG PO TABS: 150.0000 mg | ORAL_TABLET | Freq: Every morning | ORAL | 0 refills | Status: DC

## 2021-10-28 NOTE — Assessment & Plan Note (Signed)
Encouraged regular meals and adequate hydration. Pt denies alcohol use.

## 2021-10-28 NOTE — Assessment & Plan Note (Signed)
Labs collected. Will follow up with the patient as warranted.  

## 2021-10-28 NOTE — Progress Notes (Addendum)
Established Patient Office Visit  Subjective:  Patient ID: Nathan Hunt, male    DOB: Oct 07, 1950  Age: 71 y.o. MRN: [id number]  CC:  Chief Complaint  Patient presents with   Follow-up    Patient states he is here for a f/u , medication refill and some neck pain that has keep him from sleeping for about 3 days.    HPI Nathan Hunt presents for follow up  Med Management Has run out of all meds Would like to restart as he does not feel like himself Does note he felt more sedated during the day on his medications.  Poor sleep Attributes not only to running out of meds, but trazodone still not helping regardless. He did hurt his neck recently which has not helped.  Has not yet tried [place] or similar agent PDMP consulted, no concerns.   Neck pain Overuse injury per pt Onset last week. Has improved since onset Resting a lot for this.  Shob New. When standing more than 10 min, gets lightheaded and has to sit down Unpredictable pattern.  Notes his legs will shake when this happens. It will generally resolve on his own if he stands still. Does not persist more than a few seconds at a time Rarely has to sit or lie down, often "rides it out".  Closed fx of R patella Was unable to see ortho Full ROM with pain when knee flexed past 90 deg Full strength No distal symptoms. Overall much improved.   Outpatient Medications Prior to Visit  Medication Sig Dispense Refill   cetirizine (ZYRTEC) 5 MG tablet Take 5 mg by mouth daily.     traZODone (DESYREL) 150 MG tablet Take 0.5-1 tablets (75-150 mg total) by mouth at bedtime. 90 tablet 1   hydrOXYzine (ATARAX) 25 MG tablet Take 0.5-1 tablets (12.5-25 mg total) by mouth 3 (three) times daily. 30 tablet 2   risperiDONE (RISPERDAL) 1 MG tablet Take 1 tablet (1 mg total) by mouth at bedtime. 90 tablet 0   sertraline (ZOLOFT) 100 MG tablet Take 1.5 tablets (150 mg total) by mouth every morning. 135 tablet 0   ondansetron  (ZOFRAN) 4 MG tablet Take 1 tablet (4 mg total) by mouth 2 (two) times daily as needed for nausea (diarrhea, upset stomach). (Patient not taking: Reported on 01/15/2021) 20 tablet 0   No facility-administered medications prior to visit.    Review of Systems  Constitutional: Negative.   HENT: Negative.    Eyes: Negative.   Respiratory: Negative.    Cardiovascular: Negative.   Gastrointestinal: Negative.   Genitourinary: Negative.   Musculoskeletal: Negative.   Skin: Negative.   Neurological: Negative.   Psychiatric/Behavioral:  Positive for sleep disturbance.   All other systems reviewed and are negative.     Objective:     BP 122/78   Pulse 86   Temp 98 F (36.7 C) (Temporal)   Resp 18   Ht '5\' 10"'$  (1.778 m)   Wt 147 lb 12.8 oz (67 kg)   SpO2 96%   BMI 21.21 kg/m   Wt Readings from Last 3 Encounters:  10/28/21 147 lb 12.8 oz (67 kg)  07/16/21 155 lb (70.3 kg)  01/15/21 144 lb (65.3 kg)   Physical Exam Constitutional:      General: He is not in acute distress.    Appearance: Normal appearance. He is normal weight. He is not ill-appearing, toxic-appearing or diaphoretic.  Cardiovascular:     Rate and Rhythm: Normal rate and  regular rhythm.     Heart sounds: Normal heart sounds. No murmur heard.    No friction rub. No gallop.  Pulmonary:     Effort: Pulmonary effort is normal. No respiratory distress.     Breath sounds: Normal breath sounds. No stridor. No wheezing, rhonchi or rales.  Chest:     Chest wall: No tenderness.  Neurological:     General: No focal deficit present.     Mental Status: He is alert and oriented to person, place, and time. Mental status is at baseline.  Psychiatric:        Mood and Affect: Mood normal.        Behavior: Behavior normal.        Thought Content: Thought content normal.        Judgment: Judgment normal.     Results for orders placed or performed in visit on 10/28/21  CBC with Differential/Platelet  Result Value Ref Range    WBC 9.4 4.0 - 10.5 K/uL   RBC 4.48 4.22 - 5.81 Mil/uL   Hemoglobin 13.7 13.0 - 17.0 g/dL   HCT 40.5 39.0 - 52.0 %   MCV 90.4 78.0 - 100.0 fl   MCHC 33.9 30.0 - 36.0 g/dL   RDW 14.7 11.5 - 15.5 %   Platelets 247.0 150.0 - 400.0 K/uL   Neutrophils Relative % 74.3 43.0 - 77.0 %   Lymphocytes Relative 15.8 12.0 - 46.0 %   Monocytes Relative 8.8 3.0 - 12.0 %   Eosinophils Relative 0.2 0.0 - 5.0 %   Basophils Relative 0.9 0.0 - 3.0 %   Neutro Abs 7.0 1.4 - 7.7 K/uL   Lymphs Abs 1.5 0.7 - 4.0 K/uL   Monocytes Absolute 0.8 0.1 - 1.0 K/uL   Eosinophils Absolute 0.0 0.0 - 0.7 K/uL   Basophils Absolute 0.1 0.0 - 0.1 K/uL  Comprehensive metabolic panel  Result Value Ref Range   Sodium 144 135 - 145 mEq/L   Potassium 3.5 3.5 - 5.1 mEq/L   Chloride 106 96 - 112 mEq/L   CO2 28 19 - 32 mEq/L   Glucose, Bld 101 (H) 70 - 99 mg/dL   BUN 19 6 - 23 mg/dL   Creatinine, Ser 1.07 0.40 - 1.50 mg/dL   Total Bilirubin 0.4 0.2 - 1.2 mg/dL   Alkaline Phosphatase 90 39 - 117 U/L   AST 40 (H) 0 - 37 U/L   ALT 23 0 - 53 U/L   Total Protein 6.5 6.0 - 8.3 g/dL   Albumin 4.2 3.5 - 5.2 g/dL   GFR 69.91 >60.00 mL/min   Calcium 8.9 8.4 - 10.5 mg/dL  Hemoglobin A1c  Result Value Ref Range   Hgb A1c MFr Bld 5.2 4.6 - 6.5 %  Lipid panel  Result Value Ref Range   Cholesterol 209 (H) 0 - 200 mg/dL   Triglycerides 111.0 0.0 - 149.0 mg/dL   HDL 35.60 (L) >39.00 mg/dL   VLDL 22.2 0.0 - 40.0 mg/dL   LDL Cholesterol 151 (H) 0 - 99 mg/dL   Total CHOL/HDL Ratio 6    NonHDL 173.40   TSH  Result Value Ref Range   TSH 2.81 0.35 - 5.50 uIU/mL  B12 and Folate Panel  Result Value Ref Range   Vitamin B-12 284 211 - 911 pg/mL   Folate 10.3 >5.9 ng/mL  Vitamin D (25 hydroxy)  Result Value Ref Range   VITD 14.08 (L) 30.00 - 100.00 ng/mL      The 10-year  ASCVD risk score (Arnett DK, et al., 2019) is: 20.9%    Assessment & Plan:   Problem List Items Addressed This Visit       Musculoskeletal and Integument    Closed nondisplaced fracture of right patella    Was unable to see ortho. Will recheck xray to ensure healing.      Relevant Orders   DG Knee 1-2 Views Right (Completed)     Other   Depression    Restart meds. Monitor effect. Med check in 3 mo      Relevant Medications   risperiDONE (RISPERDAL) 1 MG tablet   sertraline (ZOLOFT) 100 MG tablet   Sleep disturbance    Stop trazodone, start ambien '5mg'$  po qhs prn. Reviewed risks, benefits, and side effects, pt voices understanding. PDMP consulted, no concerns.      Relevant Medications   zolpidem (AMBIEN) 5 MG tablet   Lightheadedness - Primary    History suggestive of orthostatic hypotension. Discussed nonpharm relief for this including adequate hydration and salt intake, slow rising, and regular mild exercise.       Relevant Orders   CBC with Differential/Platelet (Completed)   Hemoglobin A1c (Completed)   TSH (Completed)   B12 and Folate Panel (Completed)   Vitamin D (25 hydroxy) (Completed)   Vitamin B deficiency    Labs collected. Will follow up with the patient as warranted.       Relevant Orders   CBC with Differential/Platelet (Completed)   B12 and Folate Panel (Completed)   Vitamin D (25 hydroxy) (Completed)   Abnormal LFTs    Labs collected. Will follow up with the patient as warranted.       Relevant Orders   CBC with Differential/Platelet (Completed)   Comprehensive metabolic panel (Completed)   Lipid panel (Completed)   Mild protein-calorie malnutrition ([place])    Encouraged regular meals and adequate hydration. Pt denies alcohol use.       Other Visit Diagnoses     Weakness       Relevant Orders   CBC with Differential/Platelet (Completed)   TSH (Completed)   B12 and Folate Panel (Completed)   Vitamin D (25 hydroxy) (Completed)   Other general symptoms and signs       History of elevated glucose       Relevant Orders   Hemoglobin A1c (Completed)   Vitamin D deficiency, unspecified        Relevant Orders   Vitamin D (25 hydroxy) (Completed)       Meds ordered this encounter  Medications   risperiDONE (RISPERDAL) 1 MG tablet    Sig: Take 1 tablet (1 mg total) by mouth at bedtime.    Dispense:  90 tablet    Refill:  1    Order Specific Question:   Supervising Provider    Answer:   Carlota Raspberry, JEFFREY R [[id number]]   sertraline (ZOLOFT) 100 MG tablet    Sig: Take 1.5 tablets (150 mg total) by mouth every morning.    Dispense:  135 tablet    Refill:  0    Order Specific Question:   Supervising Provider    Answer:   Carlota Raspberry, JEFFREY R [[id number]]   zolpidem (AMBIEN) 5 MG tablet    Sig: Take 1 tablet (5 mg total) by mouth at bedtime as needed for sleep.    Dispense:  30 tablet    Refill:  0    Order Specific Question:   Supervising Provider    Answer:  GREENE, JEFFREY R [[id number]]    Return in about 3 months (around 01/28/2022) for med check.   PLAN  Maximiano Coss, NP

## 2021-10-28 NOTE — Assessment & Plan Note (Signed)
Restart meds. Monitor effect. Med check in 3 mo

## 2021-10-28 NOTE — Assessment & Plan Note (Addendum)
Stop trazodone, start ambien '5mg'$  po qhs prn. Reviewed risks, benefits, and side effects, pt voices understanding. PDMP consulted, no concerns.

## 2021-10-28 NOTE — Assessment & Plan Note (Signed)
Was unable to see ortho. Will recheck xray to ensure healing.

## 2021-10-28 NOTE — Telephone Encounter (Signed)
Reviewed in pt chart. His fracture has healed.   Thanks,  [name]

## 2021-10-28 NOTE — Assessment & Plan Note (Signed)
History suggestive of orthostatic hypotension. Discussed nonpharm relief for this including adequate hydration and salt intake, slow rising, and regular mild exercise.

## 2021-10-28 NOTE — Telephone Encounter (Signed)
DRI called in with a call report for the Xray Richard ordered.  Please advise

## 2021-10-28 NOTE — Telephone Encounter (Signed)
Pt's brother would like to speak with Nathan Hunt about his brother.   Please call [phone]

## 2021-10-28 NOTE — Patient Instructions (Addendum)
  Mr. Joos -   Always a pleasure  In brief:  Labs done today  Restart sertraline (mornings) and risperidone (nights) Add ambien (nights, for sleep)  Check in in 3 mo to see how these are doing  Repeat knee xray at [facility] - walk ins ok  Talk to you soon,  Thanks,  Rich    If you have lab work done today you will be contacted with your lab results within the next 2 weeks.  If you have not heard from [place] then please contact us. The fastest way to get your results is to register for My Chart.   IF you received an x-ray today, you will receive an invoice from [facility]. Please contact [facility] at [phone] with questions or concerns regarding your invoice.   IF you received labwork today, you will receive an invoice from [facility]. Please contact [facility] at [phone] with questions or concerns regarding your invoice.   Our billing staff will not be able to assist you with questions regarding bills from these companies.  You will be contacted with the lab results as soon as they are available. The fastest way to get your results is to activate your My Chart account. Instructions are located on the last page of this paperwork. If you have not heard from [place] regarding the results in 2 weeks, please contact this office.

## 2021-10-29 ENCOUNTER — Other Ambulatory Visit: Payer: Self-pay | Admitting: Registered Nurse

## 2021-10-29 DIAGNOSIS — E559 Vitamin D deficiency, unspecified: Secondary | ICD-10-CM

## 2021-10-29 MED ORDER — VITAMIN D (ERGOCALCIFEROL) 1.25 MG (50000 UNIT) PO CAPS: 50000.0000 [IU] | ORAL_CAPSULE | ORAL | 0 refills | Status: DC

## 2021-10-30 ENCOUNTER — Telehealth: Payer: Self-pay | Admitting: Registered Nurse

## 2021-10-30 NOTE — Telephone Encounter (Signed)
LVM with brother. No acute concerns on labs. Vitamin D low, otherwise unremarkable. LFT is only slightly elevated and not of clinical concern at this time (we typically look for this to be 5-10x the upper limit before we are urgently worried).  Thanks,  [name]

## 2021-10-30 NOTE — Telephone Encounter (Signed)
PT brother stating he would like for Nathan Hunt to give a call about his brother blood work and other info about his brother health.

## 2021-11-04 ENCOUNTER — Encounter: Payer: Self-pay | Admitting: Registered Nurse

## 2021-11-27 ENCOUNTER — Other Ambulatory Visit: Payer: Self-pay | Admitting: Registered Nurse

## 2021-11-27 ENCOUNTER — Telehealth: Payer: Self-pay | Admitting: Registered Nurse

## 2021-11-27 DIAGNOSIS — G479 Sleep disorder, unspecified: Secondary | ICD-10-CM

## 2021-11-27 MED ORDER — ZOLPIDEM TARTRATE 5 MG PO TABS: 5.0000 mg | ORAL_TABLET | Freq: Every evening | ORAL | 0 refills | Status: DC | PRN

## 2021-11-27 NOTE — Telephone Encounter (Signed)
Last visit on 10/28/21. Given zolpidem '5mg'$  po qhs #30 no refills. Pdmp reviewed. No concerns.  Refill given zolpidem '5mg'$  po qhs #30 no refills.  Kathrin Ruddy, NP

## 2021-11-27 NOTE — Telephone Encounter (Signed)
Spoke w/ pt and advised that Ambien Rx was sent in by Maximiano Coss ,NP

## 2021-11-27 NOTE — Telephone Encounter (Signed)
Pt called requesting a refill for zolpidem 5 mg. Pt does has a up coming appt on 01/29/22.

## 2021-12-30 ENCOUNTER — Telehealth: Payer: Self-pay | Admitting: Registered Nurse

## 2021-12-30 NOTE — Telephone Encounter (Signed)
Encourage patient to contact the pharmacy for refills or they can request refills through [facility]  (Please schedule appointment if patient has not been seen in over a year)    [place] TO:  [facility] [phone]  MEDICATION NAME & DOSE: zolpidem 5 mg   NOTES/COMMENTS FROM PATIENT: pt only has 2 pills left      Front office please notify patient: It takes 48-72 hours to process rx refill requests Ask patient to call pharmacy to ensure rx is ready before heading there.

## 2021-12-31 ENCOUNTER — Other Ambulatory Visit: Payer: Self-pay | Admitting: Registered Nurse

## 2021-12-31 DIAGNOSIS — G479 Sleep disorder, unspecified: Secondary | ICD-10-CM

## 2021-12-31 MED ORDER — ZOLPIDEM TARTRATE 5 MG PO TABS: 5.0000 mg | ORAL_TABLET | Freq: Every evening | ORAL | 0 refills | Status: DC | PRN

## 2021-12-31 NOTE — Telephone Encounter (Signed)
Sent Thanks Rich

## 2022-01-29 ENCOUNTER — Ambulatory Visit: Payer: Medicare Other | Admitting: Registered Nurse

## 2022-01-30 ENCOUNTER — Other Ambulatory Visit: Payer: Self-pay

## 2022-01-30 DIAGNOSIS — G479 Sleep disorder, unspecified: Secondary | ICD-10-CM

## 2022-01-30 MED ORDER — ZOLPIDEM TARTRATE 5 MG PO TABS: 5.0000 mg | ORAL_TABLET | Freq: Every evening | ORAL | 0 refills | Status: DC | PRN

## 2022-01-30 NOTE — Telephone Encounter (Signed)
Ambien started in May - 3 month follow up planned.  Controlled substance database (PDMP) reviewed. No concerns appreciated. Last filled 12/31/21 #30.  Needs follow up with new PCP, but I will refill for 1 month.

## 2022-01-30 NOTE — Telephone Encounter (Signed)
Patient is requesting a refill of the following medications: Requested Prescriptions   Pending Prescriptions Disp Refills   zolpidem (AMBIEN) 5 MG tablet 30 tablet 0    Sig: Take 1 tablet (5 mg total) by mouth at bedtime as needed for sleep.    Date of patient request: 01/30/2022 Last office visit: 10/28/2021 Date of last refill: 12/31/2021 Last refill amount: 30 tablets  Follow up time period per chart:

## 2022-02-25 ENCOUNTER — Other Ambulatory Visit: Payer: Self-pay | Admitting: Family Medicine

## 2022-02-25 DIAGNOSIS — G479 Sleep disorder, unspecified: Secondary | ICD-10-CM

## 2022-02-25 NOTE — Telephone Encounter (Signed)
Ambien 5 mg LOV: 10/28/21 Last Refill:01/30/22 Upcoming appt: none

## 2022-02-26 NOTE — Telephone Encounter (Signed)
I understand.  I am happy to refill the Ambien temporarily.  Thanks for the additional information.

## 2022-02-26 NOTE — Telephone Encounter (Signed)
Chart reviewed, last visit with primary care provider Oct 28, 2021.  Trazodone ineffective at that time.  Started Ambien, 59-monthrecheck planned.  I did refill medication last month but needs new PCP.  Please let me know who he has scheduled for new primary care provider and can temporarily refill for another month if that has been arranged.

## 2022-02-26 NOTE — Telephone Encounter (Signed)
Pt was not given resources to establish with new PCP was told by someone they didn't know who was taking new patients and resources were not found for him  I have given him the name and address for a clinic close to him and he is going to discuss with his brother (POA) what the best option is at this time.   Please refill as pt wasn't aware

## 2022-03-10 ENCOUNTER — Telehealth: Payer: Self-pay

## 2022-03-10 NOTE — Telephone Encounter (Signed)
I spoke to the pt and he states he picked up his Zolpidem a few weeks ago . He states that he is going to est care with [facility]  the pharmacy sent a refill request over and Dr Carlota Raspberry has already sent this Rx in . The pt is not happy . I explained to him that we have  refilled this medication twice now and they need a new PCP

## 2022-03-12 ENCOUNTER — Other Ambulatory Visit: Payer: Self-pay

## 2022-03-12 ENCOUNTER — Telehealth: Payer: Self-pay | Admitting: Registered Nurse

## 2022-03-12 DIAGNOSIS — F32A Depression, unspecified: Secondary | ICD-10-CM

## 2022-03-12 MED ORDER — SERTRALINE HCL 100 MG PO TABS: 150.0000 mg | ORAL_TABLET | Freq: Every morning | ORAL | 0 refills | Status: DC

## 2022-03-12 NOTE — Telephone Encounter (Signed)
Sent refill to pharmacy . Notified pt as well

## 2022-03-12 NOTE — Telephone Encounter (Signed)
Encourage patient to contact the pharmacy for refills or they can request refills through [facility]  (Please schedule appointment if patient has not been seen in over a year)    [place] TO: [facility] [phone]  MEDICATION NAME & DOSE: sertraline 100 mg  NOTES/COMMENTS FROM PATIENT: pt requesting quantity of 135; pt has appointment to est care with Dr. Ronnald Ramp 05/21/22      [place] office please notify patient: It takes 48-72 hours to process rx refill requests Ask patient to call pharmacy to ensure rx is ready before heading there. Marland Kitchen

## 2022-03-27 ENCOUNTER — Other Ambulatory Visit: Payer: Self-pay | Admitting: Registered Nurse

## 2022-03-27 DIAGNOSIS — G479 Sleep disorder, unspecified: Secondary | ICD-10-CM

## 2022-03-27 MED ORDER — ZOLPIDEM TARTRATE 5 MG PO TABS: 5.0000 mg | ORAL_TABLET | Freq: Every evening | ORAL | 0 refills | Status: DC | PRN

## 2022-03-27 NOTE — Telephone Encounter (Signed)
Called no answer LM and informed patient this has been sent in

## 2022-03-27 NOTE — Telephone Encounter (Signed)
Patient is requesting a refill of the following medications: Requested Prescriptions   Pending Prescriptions Disp Refills   zolpidem (AMBIEN) 5 MG tablet 30 tablet 0    Sig: Take 1 tablet (5 mg total) by mouth at bedtime as needed for sleep.    Date of patient request: 03/27/22 Last office visit: 10/28/21 Date of last refill: 02/26/22 Last refill amount: 30

## 2022-03-27 NOTE — Telephone Encounter (Signed)
Encourage patient to contact the pharmacy for refills or they can request refills through [facility]  (Please schedule appointment if patient has not been seen in over a year)  Last OV was 5/30/223  WHAT PHARMACY WOULD THEY LIKE THIS SENT TO: [facility] [place] 5 mg   NOTES/COMMENTS FROM PATIENT: pt states that he can't see his new pcp until 05/21/22. Pt needs a refill on medication       Front office please notify patient: It takes 48-72 hours to process rx refill requests Ask patient to call pharmacy to ensure rx is ready before heading there.

## 2022-03-31 ENCOUNTER — Other Ambulatory Visit: Payer: Self-pay

## 2022-03-31 ENCOUNTER — Telehealth: Payer: Self-pay

## 2022-03-31 DIAGNOSIS — F32A Depression, unspecified: Secondary | ICD-10-CM

## 2022-03-31 MED ORDER — SERTRALINE HCL 100 MG PO TABS: 150.0000 mg | ORAL_TABLET | Freq: Every morning | ORAL | 1 refills | Status: DC

## 2022-03-31 NOTE — Telephone Encounter (Signed)
Spoke to the pt and informed him that his Ambien was sent in on 03/27/22. Pt states he is going to start seeing [facility] Dr Ronnald Ramp .

## 2022-03-31 NOTE — Telephone Encounter (Signed)
Received a team health message stating patient called 10/30 after hours requesting a refill for sertraline and that he called last Friday as well. Per the note in the chart this medication was sent 03/12/2022 to [facility] with a confirmation receipt and per diamond's phone note she let the patient know. I left a vm for the patient letting him know this information.

## 2022-04-30 ENCOUNTER — Other Ambulatory Visit: Payer: Self-pay | Admitting: Family Medicine

## 2022-04-30 ENCOUNTER — Other Ambulatory Visit: Payer: Self-pay

## 2022-04-30 DIAGNOSIS — G479 Sleep disorder, unspecified: Secondary | ICD-10-CM

## 2022-04-30 DIAGNOSIS — F32A Depression, unspecified: Secondary | ICD-10-CM

## 2022-04-30 NOTE — Telephone Encounter (Signed)
Patient has an appointment on 05/21/22 with new PCP notes took his last dose Ambien last night and is already out of the Risperdone, he is asking that you refill this once more till his appt later in December   Patient is requesting a refill of the following medications: Requested Prescriptions   Pending Prescriptions Disp Refills   risperiDONE (RISPERDAL) 1 MG tablet 90 tablet 1    Sig: Take 1 tablet (1 mg total) by mouth at bedtime.   zolpidem (AMBIEN) 5 MG tablet 30 tablet 0    Sig: Take 1 tablet (5 mg total) by mouth at bedtime as needed for sleep.    Date of patient request: 04/30/22 Last office visit: 10/28/21 Date of last refill: 03/27/22 Last refill amount: 90

## 2022-05-01 MED ORDER — RISPERIDONE 1 MG PO TABS: 1.0000 mg | ORAL_TABLET | Freq: Every day | ORAL | 0 refills | Status: DC

## 2022-05-01 MED ORDER — ZOLPIDEM TARTRATE 5 MG PO TABS: 5.0000 mg | ORAL_TABLET | Freq: Every evening | ORAL | 0 refills | Status: DC | PRN

## 2022-05-01 NOTE — Telephone Encounter (Signed)
Pt had refill, today please close request it is not allowing me to decline

## 2022-05-21 ENCOUNTER — Encounter: Payer: Medicare Other | Admitting: Internal Medicine

## 2022-05-28 ENCOUNTER — Other Ambulatory Visit: Payer: Self-pay | Admitting: Family Medicine

## 2022-05-28 DIAGNOSIS — G479 Sleep disorder, unspecified: Secondary | ICD-10-CM

## 2022-05-28 DIAGNOSIS — F32A Depression, unspecified: Secondary | ICD-10-CM

## 2022-05-29 ENCOUNTER — Telehealth: Payer: Self-pay | Admitting: Registered Nurse

## 2022-05-29 NOTE — Telephone Encounter (Signed)
Last visit noted with Nathan Hunt in May.  Noted plan to follow-up with new primary provider soon.  He does have an appointment with Dr. Ronnald Ramp in February.  Trazodone started at his May 30 visit, restarted on Risperdal, Zoloft in May.  Controlled substance database reviewed.  Ambien last filled for #30 on 05/01/2022, previously 03/27/2022, 02/26/2022.  Refill ordered.

## 2022-05-29 NOTE — Telephone Encounter (Signed)
Patient is requesting a refill of the following medications: Requested Prescriptions   Pending Prescriptions Disp Refills   risperiDONE (RISPERDAL) 1 MG tablet [Pharmacy Med Name: risperidone 1 mg tablet] 30 tablet 0    Sig: Take 1 tablet (1 mg total) by mouth at bedtime.   zolpidem (AMBIEN) 5 MG tablet [Pharmacy Med Name: zolpidem 5 mg tablet] 30 tablet 0    Sig: Take 1 tablet (5 mg total) by mouth at bedtime as needed for sleep.    Date of patient request: 05/29/22 Last office visit: 10/28/21 Date of last refill: 05/01/22 Last refill amount: 30  Patient intends to see Di Kindle when she starts

## 2022-05-29 NOTE — Telephone Encounter (Signed)
Initial Comment Caller states he needs his medications refilled. The pharmacist is sending an electronic request. He is not out of his medication, has a few left. Translation No Nurse Assessment Nurse: Merlyn Lot, RN, [place] Date/Time (Eastern Time): 05/28/2022 6:50:34 PM Please select the assessment type ---Refill Additional Documentation ---Zolpidem 5 mg and Risperidone 1 mg - Caller states he has several of each left. I advised him to call the office when it opens for assistance and he verbalized understanding. Does the patient have enough medication to last until the office opens? ---Yes Disp. Time Eilene Ghazi Time) Disposition Final User 05/28/2022 5:44:14 PM Send To Nurse Ferne Coe, RN, Maudry Mayhew 05/28/2022 6:52:12 PM Clinical Call Yes Merlyn Lot, RN, [place] Final Disposition 05/28/2022 6:52:12 PM Clinical Call Yes Merlyn Lot, RN, [place]  Pt has a new pcp, but his TOC appt ain't  07/25/21

## 2022-05-29 NOTE — Telephone Encounter (Signed)
Pharmacy refill request is being processed

## 2022-07-01 ENCOUNTER — Other Ambulatory Visit: Payer: Self-pay | Admitting: Family Medicine

## 2022-07-01 DIAGNOSIS — F32A Depression, unspecified: Secondary | ICD-10-CM

## 2022-07-01 DIAGNOSIS — G479 Sleep disorder, unspecified: Secondary | ICD-10-CM

## 2022-07-01 NOTE — Telephone Encounter (Signed)
He is scheduled already to see a new primary care provider.  Appears his appointment with Dr. Ronnald Ramp is on 07/29/2022.  Controlled substance database reviewed.  Last filled zolpidem 5 mg #30 on 05/29/2022.  1 additional refill ordered, future refills from new primary care provider.

## 2022-07-01 NOTE — Telephone Encounter (Signed)
Ambien 5 mg LOV: 07/16/21 Last Refill:05/29/22 Upcoming appt: none I have left the pt a VM asking him to call our office to see if he has a new PCP or if he is going to est care with Di Kindle ,NP.

## 2022-07-29 ENCOUNTER — Ambulatory Visit (INDEPENDENT_AMBULATORY_CARE_PROVIDER_SITE_OTHER): Payer: Medicare Other | Admitting: Internal Medicine

## 2022-07-29 ENCOUNTER — Encounter: Payer: Self-pay | Admitting: Internal Medicine

## 2022-07-29 VITALS — BP 110/68 | HR 77 | Temp 97.3°F | Resp 16 | Ht 70.0 in | Wt 159.0 lb

## 2022-07-29 DIAGNOSIS — R972 Elevated prostate specific antigen [PSA]: Secondary | ICD-10-CM

## 2022-07-29 DIAGNOSIS — R7989 Other specified abnormal findings of blood chemistry: Secondary | ICD-10-CM | POA: Diagnosis not present

## 2022-07-29 DIAGNOSIS — E785 Hyperlipidemia, unspecified: Secondary | ICD-10-CM | POA: Insufficient documentation

## 2022-07-29 DIAGNOSIS — I700 Atherosclerosis of aorta: Secondary | ICD-10-CM

## 2022-07-29 DIAGNOSIS — N1831 Chronic kidney disease, stage 3a: Secondary | ICD-10-CM | POA: Insufficient documentation

## 2022-07-29 DIAGNOSIS — R251 Tremor, unspecified: Secondary | ICD-10-CM

## 2022-07-29 DIAGNOSIS — M542 Cervicalgia: Secondary | ICD-10-CM

## 2022-07-29 DIAGNOSIS — I959 Hypotension, unspecified: Secondary | ICD-10-CM | POA: Diagnosis not present

## 2022-07-29 DIAGNOSIS — R0609 Other forms of dyspnea: Secondary | ICD-10-CM | POA: Diagnosis not present

## 2022-07-29 DIAGNOSIS — E539 Vitamin B deficiency, unspecified: Secondary | ICD-10-CM | POA: Diagnosis not present

## 2022-07-29 DIAGNOSIS — G8929 Other chronic pain: Secondary | ICD-10-CM

## 2022-07-29 DIAGNOSIS — N400 Enlarged prostate without lower urinary tract symptoms: Secondary | ICD-10-CM

## 2022-07-29 DIAGNOSIS — Z23 Encounter for immunization: Secondary | ICD-10-CM | POA: Diagnosis not present

## 2022-07-29 DIAGNOSIS — F5104 Psychophysiologic insomnia: Secondary | ICD-10-CM

## 2022-07-29 DIAGNOSIS — Z1211 Encounter for screening for malignant neoplasm of colon: Secondary | ICD-10-CM

## 2022-07-29 LAB — HEPATIC FUNCTION PANEL
ALT: 48 U/L (ref 0–53)
AST: 28 U/L (ref 0–37)
Albumin: 4.1 g/dL (ref 3.5–5.2)
Alkaline Phosphatase: 81 U/L (ref 39–117)
Bilirubin, Direct: 0.1 mg/dL (ref 0.0–0.3)
Total Bilirubin: 0.7 mg/dL (ref 0.2–1.2)
Total Protein: 6.8 g/dL (ref 6.0–8.3)

## 2022-07-29 LAB — CBC WITH DIFFERENTIAL/PLATELET
Basophils Absolute: 0.1 10*3/uL (ref 0.0–0.1)
Basophils Relative: 0.7 % (ref 0.0–3.0)
Eosinophils Absolute: 0 10*3/uL (ref 0.0–0.7)
Eosinophils Relative: 0.3 % (ref 0.0–5.0)
HCT: 42.5 % (ref 39.0–52.0)
Hemoglobin: 14.4 g/dL (ref 13.0–17.0)
Lymphocytes Relative: 13.6 % (ref 12.0–46.0)
Lymphs Abs: 1.2 10*3/uL (ref 0.7–4.0)
MCHC: 33.8 g/dL (ref 30.0–36.0)
MCV: 89.8 fl (ref 78.0–100.0)
Monocytes Absolute: 0.7 10*3/uL (ref 0.1–1.0)
Monocytes Relative: 8.2 % (ref 3.0–12.0)
Neutro Abs: 6.9 10*3/uL (ref 1.4–7.7)
Neutrophils Relative %: 77.2 % — ABNORMAL HIGH (ref 43.0–77.0)
Platelets: 240 10*3/uL (ref 150.0–400.0)
RBC: 4.73 Mil/uL (ref 4.22–5.81)
RDW: 14.6 % (ref 11.5–15.5)
WBC: 8.9 10*3/uL (ref 4.0–10.5)

## 2022-07-29 LAB — BASIC METABOLIC PANEL
BUN: 25 mg/dL — ABNORMAL HIGH (ref 6–23)
CO2: 27 mEq/L (ref 19–32)
Calcium: 9.4 mg/dL (ref 8.4–10.5)
Chloride: 106 mEq/L (ref 96–112)
Creatinine, Ser: 1.17 mg/dL (ref 0.40–1.50)
GFR: 62.48 mL/min (ref >60.00)
Glucose, Bld: 95 mg/dL (ref 70–99)
Potassium: 4.1 mEq/L (ref 3.5–5.1)
Sodium: 140 mEq/L (ref 135–145)

## 2022-07-29 LAB — TSH: TSH: 2.72 u[IU]/mL (ref 0.35–5.50)

## 2022-07-29 LAB — PSA: PSA: 5.51 ng/mL — ABNORMAL HIGH (ref 0.10–4.00)

## 2022-07-29 LAB — CORTISOL: Cortisol, Plasma: 13.5 ug/dL

## 2022-07-29 LAB — VITAMIN B12: Vitamin B-12: 366 pg/mL (ref 211–911)

## 2022-07-29 LAB — TROPONIN I (HIGH SENSITIVITY): High Sens Troponin I: 6 ng/L (ref 2–17)

## 2022-07-29 LAB — BRAIN NATRIURETIC PEPTIDE: Pro B Natriuretic peptide (BNP): 49 pg/mL (ref 0.0–100.0)

## 2022-07-29 LAB — FOLATE: Folate: 18.1 ng/mL (ref >5.9)

## 2022-07-29 MED ORDER — ROSUVASTATIN CALCIUM 20 MG PO TABS: 20.0000 mg | ORAL_TABLET | Freq: Every day | ORAL | 1 refills | Status: DC

## 2022-07-29 MED ORDER — TRAZODONE HCL 150 MG PO TABS: 150.0000 mg | ORAL_TABLET | Freq: Every evening | ORAL | 0 refills | Status: DC | PRN

## 2022-07-29 NOTE — Progress Notes (Signed)
Subjective:  Patient ID: Nathan Hunt, male    DOB: 03-25-1951  Age: 72 y.o. MRN: [id number]  CC: Hyperlipidemia   HPI MINORU QUAIN presents for establishing -----    He tells me that 2 months ago he was hospitalized in [place] for schizophrenia.  He was eventually transferred to a hospital in [place].  He has improved since discharge but complains of a several year history of tremor in his hands.  He says he has a chronically low blood pressure and struggles with lightheadedness, dyspnea on exertion, and dizziness.  He complains that he has persistent insomnia but is not taking trazodone..  He has chronic neck pain and would like to see a specialist.  Outpatient Medications Prior to Visit  Medication Sig Dispense Refill   risperiDONE (RISPERDAL) 1 MG tablet Take 1 tablet (1 mg total) by mouth at bedtime. 30 tablet 0   sertraline (ZOLOFT) 100 MG tablet Take 1.5 tablets (150 mg total) by mouth every morning. 135 tablet 1   zolpidem (AMBIEN) 5 MG tablet Take 1 tablet (5 mg total) by mouth at bedtime as needed for sleep. 30 tablet 0   Vitamin D, Ergocalciferol, (DRISDOL) 1.25 MG (50000 UNIT) CAPS capsule Take 1 capsule (50,000 Units total) by mouth every 7 (seven) days. (Patient not taking: Reported on 07/29/2022) 12 capsule 0   cetirizine (ZYRTEC) 5 MG tablet Take 5 mg by mouth daily. (Patient not taking: Reported on 07/29/2022)     ondansetron (ZOFRAN) 4 MG tablet Take 1 tablet (4 mg total) by mouth 2 (two) times daily as needed for nausea (diarrhea, upset stomach). (Patient not taking: Reported on 01/15/2021) 20 tablet 0   traZODone (DESYREL) 150 MG tablet Take 0.5-1 tablets (75-150 mg total) by mouth at bedtime. (Patient not taking: Reported on 07/29/2022) 90 tablet 1   No facility-administered medications prior to visit.    ROS Review of Systems  Constitutional: Negative.  Negative for diaphoresis and fatigue.  HENT: Negative.    Respiratory:  Positive for  shortness of breath. Negative for cough, chest tightness and wheezing.   Cardiovascular:  Negative for chest pain, palpitations and leg swelling.  Gastrointestinal:  Negative for abdominal pain, blood in stool, constipation, diarrhea, nausea and vomiting.  Genitourinary:  Positive for difficulty urinating. Negative for dysuria, hematuria, penile discharge, scrotal swelling, testicular pain and urgency.       Weak urine stream  Musculoskeletal:  Positive for neck pain.  Skin: Negative.   Neurological:  Positive for dizziness, tremors and light-headedness. Negative for weakness and numbness.  Hematological:  Negative for adenopathy. Does not bruise/bleed easily.  Psychiatric/Behavioral:  Positive for confusion, decreased concentration, dysphoric mood and sleep disturbance. Negative for agitation, behavioral problems and suicidal ideas. The patient is not nervous/anxious.     Objective:  BP 110/68 (BP Location: Left Arm, Patient Position: Sitting, Cuff Size: Normal)   Pulse 77   Temp (!) 97.3 F (36.3 C) (Oral)   Resp 16   Ht '5\' 10"'$  (1.778 m)   Wt 159 lb (72.1 kg)   SpO2 96%   BMI 22.81 kg/m   BP Readings from Last 3 Encounters:  07/29/22 110/68  10/28/21 122/78  07/16/21 98/60    Wt Readings from Last 3 Encounters:  07/29/22 159 lb (72.1 kg)  10/28/21 147 lb 12.8 oz (67 kg)  07/16/21 155 lb (70.3 kg)    Physical Exam Vitals reviewed.  HENT:     Nose: Nose normal.     Mouth/Throat:  Mouth: Mucous membranes are moist.  Eyes:     General: No scleral icterus.    Conjunctiva/sclera: Conjunctivae normal.  Cardiovascular:     Rate and Rhythm: Normal rate and regular rhythm.     Heart sounds: Normal heart sounds and S1 normal.     No friction rub. No gallop.     Comments: EKG- NSR, 69 bpm No LVH or Q waves Normal EKG Pulmonary:     Breath sounds: No stridor. No wheezing, rhonchi or rales.  Abdominal:     General: Abdomen is flat.     Palpations: There is no mass.      Tenderness: There is no abdominal tenderness. There is no guarding.     Hernia: No hernia is present.  Musculoskeletal:     Cervical back: Neck supple.     Right lower leg: No edema.     Left lower leg: No edema.  Skin:    General: Skin is warm and dry.     Findings: No rash.  Neurological:     General: No focal deficit present.     Mental Status: He is alert. Mental status is at baseline.     Motor: Tremor present.  Psychiatric:        Mood and Affect: Mood normal.        Behavior: Behavior normal.     Lab Results  Component Value Date   WBC 8.9 07/29/2022   HGB 14.4 07/29/2022   HCT 42.5 07/29/2022   PLT 240.0 07/29/2022   GLUCOSE 95 07/29/2022   CHOL 209 (H) 10/28/2021   TRIG 111.0 10/28/2021   HDL 35.60 (L) 10/28/2021   LDLCALC 151 (H) 10/28/2021   ALT 48 07/29/2022   AST 28 07/29/2022   NA 140 07/29/2022   K 4.1 07/29/2022   CL 106 07/29/2022   CREATININE 1.17 07/29/2022   BUN 25 (H) 07/29/2022   CO2 27 07/29/2022   TSH 2.72 07/29/2022   PSA 5.51 (H) 07/29/2022   HGBA1C 5.2 10/28/2021    DG Knee 1-2 Views Right  Result Date: 10/28/2021 CLINICAL DATA:  Follow-up patellar fracture. EXAM: RIGHT KNEE - 1-2 VIEW COMPARISON:  Radiographs 07/16/2021 FINDINGS: The patellar fracture has healed. No new/acute bony findings. No joint effusion. Stable vascular calcifications. IMPRESSION: Interval healing of patellar fracture. Electronically Signed   By: Marijo Sanes M.D.   On: 10/28/2021 13:20    Assessment & Plan:   Breandan was seen today for hyperlipidemia.  Diagnoses and all orders for this visit:  Tremor of both hands -     TSH; Future -     Basic metabolic panel; Future -     Ambulatory referral to Neurology -     Basic metabolic panel -     TSH  DOE (dyspnea on exertion)- EKG and labs are reassuring.  I recommended a coronary calcium score to gauge his risk for coronary artery disease. -     CBC with Differential/Platelet; Future -     Troponin I (High  Sensitivity); Future -     Brain natriuretic peptide; Future -     EKG 12-Lead -     Brain natriuretic peptide -     Troponin I (High Sensitivity) -     CBC with Differential/Platelet -     CT CARDIAC SCORING (SELF PAY ONLY); Future  Benign prostatic hyperplasia, unspecified whether lower urinary tract symptoms present -     Urinalysis, Routine w reflex microscopic; Future -  PSA; Future -     PSA -     Urinalysis, Routine w reflex microscopic  Aortic atherosclerosis ([place])- Will address risk factor modifications. -     rosuvastatin (CRESTOR) 20 MG tablet; Take 1 tablet (20 mg total) by mouth daily.  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 -     Hepatic function panel; Future -     Hepatic function panel  Vitamin B deficiency -     CBC with Differential/Platelet; Future -     Vitamin B12; Future -     Folate; Future -     Folate -     Vitamin B12 -     CBC with Differential/Platelet  Dyslipidemia, goal LDL below 100- I recommended that he take a statin for cardiovascular risk reduction. -     rosuvastatin (CRESTOR) 20 MG tablet; Take 1 tablet (20 mg total) by mouth daily.  Screen for colon cancer -     Ambulatory referral to Gastroenterology  Need for vaccination -     Flu Vaccine QUAD High Dose(Fluad) -     Pneumococcal conjugate vaccine 20-valent (Prevnar 20)  Hypotension, unspecified hypotension type- He does not have adrenal insufficiency. -     Cortisol; Future -     Cortisol  Psychophysiological insomnia -     traZODone (DESYREL) 150 MG tablet; Take 1 tablet (150 mg total) by mouth at bedtime as needed for sleep.  PSA elevation -     Ambulatory referral to Urology  Neck pain, chronic -     Ambulatory referral to Physical Medicine Rehab  Stage 3a chronic kidney disease ([place])- Will avoid nephrotoxic agents.   I have discontinued Lamark B. Mckeithan's cetirizine and ondansetron. I have also changed his traZODone. Additionally, I am having him start on  rosuvastatin. Lastly, I am having him maintain his Vitamin D (Ergocalciferol), sertraline, risperiDONE, and zolpidem.  Meds ordered this encounter  Medications   rosuvastatin (CRESTOR) 20 MG tablet    Sig: Take 1 tablet (20 mg total) by mouth daily.    Dispense:  90 tablet    Refill:  1   traZODone (DESYREL) 150 MG tablet    Sig: Take 1 tablet (150 mg total) by mouth at bedtime as needed for sleep.    Dispense:  90 tablet    Refill:  0     Follow-up: No follow-ups on file.  Scarlette Calico, MD

## 2022-07-30 LAB — URINALYSIS, ROUTINE W REFLEX MICROSCOPIC
Bilirubin Urine: NEGATIVE
Hgb urine dipstick: NEGATIVE
Ketones, ur: NEGATIVE
Leukocytes,Ua: NEGATIVE
Nitrite: NEGATIVE
RBC / HPF: NONE SEEN (ref 0–?)
Specific Gravity, Urine: >=1.03 — AB (ref 1.000–1.030)
Total Protein, Urine: NEGATIVE
Urine Glucose: NEGATIVE
Urobilinogen, UA: 0.2 (ref 0.0–1.0)
pH: 6 (ref 5.0–8.0)

## 2022-07-31 ENCOUNTER — Encounter: Payer: Self-pay | Admitting: Internal Medicine

## 2022-08-07 ENCOUNTER — Telehealth: Payer: Self-pay | Admitting: Registered Nurse

## 2022-08-07 ENCOUNTER — Other Ambulatory Visit: Payer: Self-pay | Admitting: Internal Medicine

## 2022-08-07 DIAGNOSIS — F5104 Psychophysiologic insomnia: Secondary | ICD-10-CM

## 2022-08-07 DIAGNOSIS — G479 Sleep disorder, unspecified: Secondary | ICD-10-CM

## 2022-08-07 DIAGNOSIS — F32A Depression, unspecified: Secondary | ICD-10-CM

## 2022-08-07 DIAGNOSIS — F322 Major depressive disorder, single episode, severe without psychotic features: Secondary | ICD-10-CM

## 2022-08-07 NOTE — Telephone Encounter (Signed)
Prescription Request  08/07/2022  LOV: 10/28/2021  What is the name of the medication or equipment? risperiDONE (RISPERDAL) 1 MG tablet  zolpidem (AMBIEN) 5 MG tablet [   Have you contacted your pharmacy to request a refill? No    Which pharmacy would you like this sent to?  [facility], [place] [address] Phone: [phone] Fax: [facility], [place] AT [address] [place] [address] [place] [address] Phone: [phone] Fax: [phone]    Patient notified that their request is being sent to the clinical staff for review and that they should receive a response within 2 business days.   Please advise at Mobile [phone] (mobile)

## 2022-08-17 ENCOUNTER — Ambulatory Visit (HOSPITAL_COMMUNITY): Payer: Medicare Other | Attending: Internal Medicine

## 2022-08-28 ENCOUNTER — Encounter: Payer: Self-pay | Admitting: Internal Medicine

## 2022-09-03 NOTE — Telephone Encounter (Signed)
PT's brother calls today inquiring on the lab results and referrals. He states that his brother has been without a phone for a bit and was not sure who had or had not reached out to him to try and get him scheduled out to his specialists.  I shared the name and numbers for the facilities with him to have him try to follow up himself. He knows that Dr.Jones would want him to follow up with these offices ASAP but was wanting to know what would be suggested first?   He was also inquiring on if a nephrology referral was necessary due to PT's Stage 3 chronic kidney disease?  CB: [phone]

## 2022-09-15 ENCOUNTER — Ambulatory Visit: Payer: Medicare Other | Admitting: Physical Therapy

## 2022-09-30 ENCOUNTER — Ambulatory Visit: Payer: Medicare Other | Attending: Internal Medicine

## 2022-10-09 ENCOUNTER — Telehealth: Payer: Self-pay | Admitting: Internal Medicine

## 2022-10-09 NOTE — Telephone Encounter (Signed)
Patient called and said that he missed his appointment with [facility] and that they will not reschedule him.  He doesn't know if you can get him another appointment or if he needs to be referred to someone else.  Please call patient and let him know.  Phone:  [phone]

## 2022-10-14 ENCOUNTER — Ambulatory Visit
Admission: RE | Admit: 2022-10-14 | Discharge: 2022-10-14 | Disposition: A | Discharge status: Home / Self Care | Payer: Medicare Other | Source: Ambulatory Visit | Attending: Internal Medicine | Admitting: Internal Medicine

## 2022-10-14 ENCOUNTER — Other Ambulatory Visit: Payer: Self-pay | Admitting: Internal Medicine

## 2022-10-14 DIAGNOSIS — R0609 Other forms of dyspnea: Secondary | ICD-10-CM

## 2022-10-14 DIAGNOSIS — R9431 Abnormal electrocardiogram [ECG] [EKG]: Secondary | ICD-10-CM

## 2022-10-14 DIAGNOSIS — R931 Abnormal findings on diagnostic imaging of heart and coronary circulation: Secondary | ICD-10-CM | POA: Insufficient documentation

## 2022-10-14 NOTE — Telephone Encounter (Signed)
Sent an email to the referral coordinator to see what is going on.

## 2022-10-15 NOTE — Telephone Encounter (Signed)
Per Selena Batten at the office, he cannot schedule with his previous MD, Manny, due to too many no shows.  He can schedule with another provider.  He also has a balance to take care of.   I have LM making pt aware of this information

## 2022-11-19 ENCOUNTER — Other Ambulatory Visit: Payer: Self-pay | Admitting: Internal Medicine

## 2022-11-19 DIAGNOSIS — F5104 Psychophysiologic insomnia: Secondary | ICD-10-CM

## 2022-11-26 ENCOUNTER — Other Ambulatory Visit: Payer: Self-pay | Admitting: Urology

## 2022-11-26 DIAGNOSIS — R972 Elevated prostate specific antigen [PSA]: Secondary | ICD-10-CM

## 2022-12-01 ENCOUNTER — Ambulatory Visit (INDEPENDENT_AMBULATORY_CARE_PROVIDER_SITE_OTHER): Payer: Medicare Other

## 2022-12-01 DIAGNOSIS — Z0000 Encounter for general adult medical examination without abnormal findings: Secondary | ICD-10-CM | POA: Diagnosis not present

## 2022-12-01 NOTE — Progress Notes (Signed)
Subjective:   Nathan Hunt is a 72 y.o. male who presents for an Initial Medicare Annual Wellness Visit.  Visit Complete: Virtual  I connected with  Michiel Sites on 12/01/22 by a audio enabled telemedicine application and verified that I am speaking with the correct person using two identifiers.  Patient Location: Home  Provider Location: Home Office  I discussed the limitations of evaluation and management by telemedicine. The patient expressed understanding and agreed to proceed.  Patient Medicare AWV questionnaire was completed by the patient on 12/01/2022; I have confirmed that all information answered by patient is correct and no changes since this date.  Cardiac Risk Factors include: advanced age (>3men, >108 women);male gender     Objective:    Today's Vitals   There is no height or weight on file to calculate BMI.     12/01/2022   10:25 AM 09/11/2012   12:04 AM  Advanced Directives  Does Patient Have a Medical Advance Directive? Yes Patient does not have advance directive  Type of Advance Directive Healthcare Power of [place];Living will   Does patient want to make changes to medical advance directive? No - Patient declined   Copy of Healthcare Power of Attorney in Chart? Yes - validated most recent copy scanned in chart (See row information)     Current Medications (verified) Outpatient Encounter Medications as of 12/01/2022  Medication Sig   risperiDONE (RISPERDAL) 1 MG tablet Take 1 tablet (1 mg total) by mouth at bedtime.   rosuvastatin (CRESTOR) 20 MG tablet Take 1 tablet (20 mg total) by mouth daily.   sertraline (ZOLOFT) 100 MG tablet Take 1.5 tablets (150 mg total) by mouth every morning.   traZODone (DESYREL) 150 MG tablet Take 1 tablet (150 mg total) by mouth at bedtime as needed for sleep.   Vitamin D, Ergocalciferol, (DRISDOL) 1.25 MG (50000 UNIT) CAPS capsule Take 1 capsule (50,000 Units total) by mouth every 7 (seven) days. (Patient not taking:  Reported on 07/29/2022)   zolpidem (AMBIEN) 5 MG tablet Take 1 tablet (5 mg total) by mouth at bedtime as needed for sleep.   No facility-administered encounter medications on file as of 12/01/2022.    Allergies (verified) Patient has no known allergies.   History: Past Medical History:  Diagnosis Date   BPH (benign prostatic hyperplasia)    Depression    Hyperlipidemia    Hypertension    Insomnia    Tendonitis    Tinnitus    Past Surgical History:  Procedure Laterality Date   TONSILLECTOMY  1962   Family History  Problem Relation Age of Onset   Heart disease Mother    Heart disease Father    Heart disease Sister    Bone cancer Maternal Grandfather    Bone cancer Other    Alzheimer's disease Other    Colon cancer Neg Hx    Social History   Socioeconomic History   Marital status: Single    Spouse name: Not on file   Number of children: Not on file   Years of education: Not on file   Highest education level: Not on file  Occupational History   Not on file  Tobacco Use   Smoking status: Former    Packs/day: 1.00    Years: 24.00    Additional pack years: 0.00    Total pack years: 24.00    Types: Cigarettes    Start date: 79    Quit date: 21    Years since  quitting: 31.5    Passive exposure: Past   Smokeless tobacco: Never  Vaping Use   Vaping Use: Never used  Substance and Sexual Activity   Alcohol use: Not Currently   Drug use: No   Sexual activity: Not Currently  Other Topics Concern   Not on file  Social History Narrative   Not on file   Social Determinants of Health   Financial Resource Strain: Low Risk  (12/01/2022)   Overall Financial Resource Strain (CARDIA)    Difficulty of Paying Living Expenses: Not hard at all  Food Insecurity: No Food Insecurity (12/01/2022)   Hunger Vital Sign    Worried About Running Out of Food in the Last Year: Never true    Ran Out of Food in the Last Year: Never true  Transportation Needs: No Transportation Needs  (12/01/2022)   PRAPARE - Administrator, Civil Service (Medical): No    Lack of Transportation (Non-Medical): No  Physical Activity: Inactive (12/01/2022)   Exercise Vital Sign    Days of Exercise per Week: 0 days    Minutes of Exercise per Session: 0 min  Stress: No Stress Concern Present (12/01/2022)   [employer] of Occupational Health - Occupational Stress Questionnaire    Feeling of Stress : Not at all  Social Connections: Socially Isolated (12/01/2022)   Social Connection and Isolation Panel [NHANES]    Frequency of Communication with Friends and Family: Once a week    Frequency of Social Gatherings with Friends and Family: Once a week    Attends Religious Services: Never    Database administrator or Organizations: No    Attends Engineer, structural: Never    Marital Status: Never married    Tobacco Counseling Counseling given: Not Answered   Clinical Intake:  Pre-visit preparation completed: Yes  Pain : No/denies pain     Diabetes: No  How often do you need to have someone help you when you read instructions, pamphlets, or other written materials from your doctor or pharmacy?: 1 - Never What is the last grade level you completed in school?: bachelors degree  Interpreter Needed?: No      Activities of Daily Living    12/01/2022   10:19 AM  In your present state of health, do you have any difficulty performing the following activities:  Hearing? 1  Vision? 0  Difficulty concentrating or making decisions? 0  Walking or climbing stairs? 0  Dressing or bathing? 0  Doing errands, shopping? 0  Preparing Food and eating ? N  Using the Toilet? N  In the past six months, have you accidently leaked urine? N  Do you have problems with loss of bowel control? N  Managing your Medications? N  Managing your Finances? N  Housekeeping or managing your Housekeeping? N    Patient Care Team: Etta Grandchild, MD as PCP - General (Internal  Medicine)  Indicate any recent Medical Services you may have received from other than [facility] providers in the past year (date may be approximate).     Assessment:   This is a routine wellness examination for [place].  Hearing/Vision screen No results found.  Dietary issues and exercise activities discussed:     Goals Addressed             This Visit's Progress    Activity and Exercise Increased       Evidence-based guidance:  Review current exercise levels.  Assess patient perspective on exercise or  activity level, barriers to increasing activity, motivation and readiness for change.  Recommend or set healthy exercise goal based on individual tolerance.  Encourage small steps toward making change in amount of exercise or activity.  Urge reduction of sedentary activities or screen time.  Promote group activities within the community or with family or support person.  Consider referral to rehabiliation therapist for assessment and exercise/activity plan.   Notes:       Depression Screen    12/01/2022   10:25 AM 12/01/2022   10:23 AM 07/29/2022    3:09 PM 07/16/2021    3:39 PM 02/25/2021    7:53 AM 09/11/2020    1:00 PM 08/13/2020    4:11 PM  PHQ 2/9 Scores  PHQ - 2 Score 0 0 0 4 0 0 0  PHQ- 9 Score 0 0  9 0      Fall Risk    12/01/2022   10:25 AM 07/29/2022    3:07 PM 10/28/2021   11:01 AM 02/25/2021    7:52 AM 01/15/2021    8:37 AM  Fall Risk   Falls in the past year? 1 1 1 1  0  Number falls in past yr: 1 0 1 0 0  Comment  right knee crack     Injury with Fall? 1 1 1  0 0  Risk for fall due to : History of fall(s);Impaired mobility Impaired balance/gait;Other (Comment) History of fall(s)  No Fall Risks  Follow up Falls evaluation completed Falls evaluation completed Falls evaluation completed  Falls evaluation completed    MEDICARE RISK AT HOME:  Medicare Risk at Home - 12/01/22 1025     Any stairs in or around the home? Yes    If so, are there any without handrails?  No    Home free of loose throw rugs in walkways, pet beds, electrical cords, etc? Yes    Adequate lighting in your home to reduce risk of falls? Yes    Life alert? No    Use of a cane, walker or w/c? No    Grab bars in the bathroom? Yes    Shower chair or bench in shower? No    Elevated toilet seat or a handicapped toilet? No             TIMED UP AND GO:  Was the test performed? No    Cognitive Function:        12/01/2022   10:26 AM  6CIT Screen  What Year? 0 points  What month? 0 points  What time? 0 points  Count back from 20 0 points  Months in reverse 0 points  Repeat phrase 6 points  Total Score 6 points    Immunizations Immunization History  Administered Date(s) Administered   Fluad Quad(high Dose 65+) 07/29/2022   Influenza Whole 05/05/2002   Influenza,inj,Quad PF,6+ Mos 03/20/2013, 03/29/2014, 02/20/2015   Moderna Sars-Covid-2 Vaccination 07/08/2019   PFIZER(Purple Top)SARS-COV-2 Vaccination 07/29/2019   PNEUMOCOCCAL CONJUGATE-20 07/29/2022    TDAP status: Due, Education has been provided regarding the importance of this vaccine. Advised may receive this vaccine at local pharmacy or Health Dept. Aware to provide a copy of the vaccination record if obtained from local pharmacy or Health Dept. Verbalized acceptance and understanding.  Flu Vaccine status: Up to date  Pneumococcal vaccine status: Up to date  Covid-19 vaccine status: Information provided on how to obtain vaccines.   Qualifies for Shingles Vaccine? Yes   Zostavax completed No   Shingrix Completed?: No.  Education has been provided regarding the importance of this vaccine. Patient has been advised to call insurance company to determine out of pocket expense if they have not yet received this vaccine. Advised may also receive vaccine at local pharmacy or Health Dept. Verbalized acceptance and understanding.  Screening Tests Health Maintenance  Topic Date Due   DTaP/Tdap/Td (1 - Tdap) Never  done   Zoster Vaccines- Shingrix (1 of 2) Never done   COVID-19 Vaccine (3 - 2023-24 season) 01/30/2022   Colonoscopy  09/20/2022   INFLUENZA VACCINE  12/31/2022   Medicare Annual Wellness (AWV)  12/01/2023   Pneumonia Vaccine 73+ Years old  Completed   Hepatitis C Screening  Completed   HPV VACCINES  Aged Out    Health Maintenance  Health Maintenance Due  Topic Date Due   DTaP/Tdap/Td (1 - Tdap) Never done   Zoster Vaccines- Shingrix (1 of 2) Never done   COVID-19 Vaccine (3 - 2023-24 season) 01/30/2022   Colonoscopy  09/20/2022    Colorectal cancer screening: Type of screening: Colonoscopy. Completed 09/19/2012. Repeat every 10 years  Lung Cancer Screening: (Low Dose CT Chest recommended if Age 7-80 years, 20 pack-year currently smoking OR have quit w/in 15years.) does not qualify.   Lung Cancer Screening Referral: na  Additional Screening:  Hepatitis C Screening: does qualify; Completed 09/11/2020  Vision Screening: Recommended annual ophthalmology exams for early detection of glaucoma and other disorders of the eye. Is the patient up to date with their annual eye exam?  No  Who is the provider or what is the name of the office in which the patient attends annual eye exams? Not seen- wants referral If pt is not established with a provider, would they like to be referred to a provider to establish care? Yes .   Dental Screening: Recommended annual dental exams for proper oral hygiene  Diabetic Foot Exam:   Community Resource Referral / Chronic Care Management: CRR required this visit?  No   CCM required this visit?  No    Plan:     I have personally reviewed and noted the following in the patient's chart:   Medical and social history Use of alcohol, tobacco or illicit drugs  Current medications and supplements including opioid prescriptions. Patient is not currently taking opioid prescriptions. Functional ability and status Nutritional status Physical  activity Advanced directives List of other physicians Hospitalizations, surgeries, and ER visits in previous 12 months Vitals Screenings to include cognitive, depression, and falls Referrals and appointments  In addition, I have reviewed and discussed with patient certain preventive protocols, quality metrics, and best practice recommendations. A written personalized care plan for preventive services as well as general preventive health recommendations were provided to patient.     Delana Meyer   12/01/2022   After Visit Summary: (MyChart) Due to this being a telephonic visit, the after visit summary with patients personalized plan was offered to patient via MyChart   Nurse Notes: none

## 2022-12-01 NOTE — Patient Instructions (Signed)
Health Maintenance, Male Adopting a healthy lifestyle and getting preventive care are important in promoting health and wellness. Ask your health care provider about: The right schedule for you to have regular tests and exams. Things you can do on your own to prevent diseases and keep yourself healthy. What should I know about diet, weight, and exercise? Eat a healthy diet  Eat a diet that includes plenty of vegetables, fruits, low-fat dairy products, and lean protein. Do not eat a lot of foods that are high in solid fats, added sugars, or sodium. Maintain a healthy weight Body mass index (BMI) is a measurement that can be used to identify possible weight problems. It estimates body fat based on height and weight. Your health care provider can help determine your BMI and help you achieve or maintain a healthy weight. Get regular exercise Get regular exercise. This is one of the most important things you can do for your health. Most adults should: Exercise for at least 150 minutes each week. The exercise should increase your heart rate and make you sweat (moderate-intensity exercise). Do strengthening exercises at least twice a week. This is in addition to the moderate-intensity exercise. Spend less time sitting. Even light physical activity can be beneficial. Watch cholesterol and blood lipids Have your blood tested for lipids and cholesterol at 72 years of age, then have this test every 5 years. You may need to have your cholesterol levels checked more often if: Your lipid or cholesterol levels are high. You are older than 72 years of age. You are at high risk for heart disease. What should I know about cancer screening? Many types of cancers can be detected early and may often be prevented. Depending on your health history and family history, you may need to have cancer screening at various ages. This may include screening for: Colorectal cancer. Prostate cancer. Skin cancer. Lung  cancer. What should I know about heart disease, diabetes, and high blood pressure? Blood pressure and heart disease High blood pressure causes heart disease and increases the risk of stroke. This is more likely to develop in people who have high blood pressure readings or are overweight. Talk with your health care provider about your target blood pressure readings. Have your blood pressure checked: Every 3-5 years if you are 18-39 years of age. Every year if you are 40 years old or older. If you are between the ages of 65 and 75 and are a current or former smoker, ask your health care provider if you should have a one-time screening for abdominal aortic aneurysm (AAA). Diabetes Have regular diabetes screenings. This checks your fasting blood sugar level. Have the screening done: Once every three years after age 45 if you are at a normal weight and have a low risk for diabetes. More often and at a younger age if you are overweight or have a high risk for diabetes. What should I know about preventing infection? Hepatitis B If you have a higher risk for hepatitis B, you should be screened for this virus. Talk with your health care provider to find out if you are at risk for hepatitis B infection. Hepatitis C Blood testing is recommended for: Everyone born from 1945 through 1965. Anyone with known risk factors for hepatitis C. Sexually transmitted infections (STIs) You should be screened each year for STIs, including gonorrhea and chlamydia, if: You are sexually active and are younger than 72 years of age. You are older than 72 years of age and your   health care provider tells you that you are at risk for this type of infection. Your sexual activity has changed since you were last screened, and you are at increased risk for chlamydia or gonorrhea. Ask your health care provider if you are at risk. Ask your health care provider about whether you are at high risk for HIV. Your health care provider  may recommend a prescription medicine to help prevent HIV infection. If you choose to take medicine to prevent HIV, you should first get tested for HIV. You should then be tested every 3 months for as long as you are taking the medicine. Follow these instructions at home: Alcohol use Do not drink alcohol if your health care provider tells you not to drink. If you drink alcohol: Limit how much you have to 0-2 drinks a day. Know how much alcohol is in your drink. In the [place] one drink equals one 12 oz bottle of beer (355 mL), one 5 oz glass of wine (148 mL), or one 1 oz glass of hard liquor (44 mL). Lifestyle Do not use any products that contain nicotine or tobacco. These products include cigarettes, chewing tobacco, and vaping devices, such as e-cigarettes. If you need help quitting, ask your health care provider. Do not use street drugs. Do not share needles. Ask your health care provider for help if you need support or information about quitting drugs. General instructions Schedule regular health, dental, and eye exams. Stay current with your vaccines. Tell your health care provider if: You often feel depressed. You have ever been abused or do not feel safe at home. Summary Adopting a healthy lifestyle and getting preventive care are important in promoting health and wellness. Follow your health care provider's instructions about healthy diet, exercising, and getting tested or screened for diseases. Follow your health care provider's instructions on monitoring your cholesterol and blood pressure. This information is not intended to replace advice given to you by your health care provider. Make sure you discuss any questions you have with your health care provider. Document Revised: 10/07/2020 Document Reviewed: 10/07/2020 Elsevier Patient Education  2024 Elsevier Inc.  

## 2022-12-16 ENCOUNTER — Other Ambulatory Visit: Payer: Self-pay | Admitting: Internal Medicine

## 2022-12-16 DIAGNOSIS — R931 Abnormal findings on diagnostic imaging of heart and coronary circulation: Secondary | ICD-10-CM

## 2022-12-16 DIAGNOSIS — R9431 Abnormal electrocardiogram [ECG] [EKG]: Secondary | ICD-10-CM

## 2023-01-15 ENCOUNTER — Ambulatory Visit
Admission: RE | Admit: 2023-01-15 | Discharge: 2023-01-15 | Disposition: A | Discharge status: Home / Self Care | Payer: Medicare Other | Source: Ambulatory Visit | Attending: Urology | Admitting: Urology

## 2023-01-15 DIAGNOSIS — R972 Elevated prostate specific antigen [PSA]: Secondary | ICD-10-CM

## 2023-01-15 MED ORDER — GADOPICLENOL 0.5 MMOL/ML IV SOLN
7.5000 mL | Freq: Once | INTRAVENOUS | Status: AC | PRN
  Administered 2023-01-15: 7.5 mL via INTRAVENOUS

## 2023-02-09 ENCOUNTER — Other Ambulatory Visit: Payer: Self-pay | Admitting: Internal Medicine

## 2023-02-09 DIAGNOSIS — F5104 Psychophysiologic insomnia: Secondary | ICD-10-CM

## 2023-02-12 NOTE — Telephone Encounter (Signed)
Patient wants to know if enough of the zolpidem can be sent in until his 02/23/2023 appointment.  Please advise

## 2023-02-17 ENCOUNTER — Ambulatory Visit: Payer: Medicare Other | Attending: Internal Medicine | Admitting: Internal Medicine

## 2023-02-17 ENCOUNTER — Encounter: Payer: Self-pay | Admitting: Internal Medicine

## 2023-02-17 VITALS — BP 110/80 | HR 71 | Ht 69.0 in | Wt 159.0 lb

## 2023-02-17 DIAGNOSIS — I700 Atherosclerosis of aorta: Secondary | ICD-10-CM | POA: Diagnosis present

## 2023-02-17 DIAGNOSIS — E785 Hyperlipidemia, unspecified: Secondary | ICD-10-CM | POA: Insufficient documentation

## 2023-02-17 MED ORDER — ASPIRIN 81 MG PO TBEC: 81.0000 mg | DELAYED_RELEASE_TABLET | Freq: Every day | ORAL | 3 refills | Status: AC

## 2023-02-17 NOTE — Progress Notes (Signed)
Cardiology Office Note:  .   Date:  02/17/2023  ID:  Nathan Hunt, DOB [birth year], MRN [id number] PCP: Nathan Grandchild, MD  [facility] Providers Cardiologist:  None    History of Present Illness: .   Nathan Hunt is a 72 y.o. male  stage 3a CKD, schizophrenia/bipolar, referral for CAC agatston score of 656 at 75th percentile.   History of Present Illness   Nathan Hunt, a patient with a history of smoking and high plaque levels in his arteries, presents with occasional sharp chest pain on the right side. The pain, which started a few months ago, lasts for about five minutes and is not continuous. The patient also reports a history of fainting, particularly when transitioning from a prone position to standing. This has led to a fear of physical activity, including walking around the block, resulting in a largely sedentary lifestyle. The patient spends most of his day sitting up in bed. He has been on Crestor 20, a statin medication, for a month or two. A recent CT scan showed a high level of plaque in his arteries.      His blood pressure is well controlled 110/80 mmHg He is unclear of the medications he is on.  ROS:  per HPI otherwise negative   Studies Reviewed: Marland Kitchen        EKG Interpretation Date/Time:  Wednesday February 17 2023 14:18:06 EDT Ventricular Rate:  71 PR Interval:  152 QRS Duration:  86 QT Interval:  396 QTC Calculation: 430 R Axis:   -1  Text Interpretation: Normal sinus rhythm Normal ECG When compared with ECG of 08-Jul-1999 15:20, No significant change since last tracing Confirmed by Carolan Clines ([id number]) on 02/17/2023 2:21:13 PM  Risk Assessment/Calculations:        Physical Exam:   VS:   Vitals:   02/17/23 1419  BP: 110/80  Pulse: 71  SpO2: 98%    Wt Readings from Last 3 Encounters:  07/29/22 159 lb (72.1 kg)  10/28/21 147 lb 12.8 oz (67 kg)  07/16/21 155 lb (70.3 kg)    GEN: Well nourished, well developed in no acute distress NECK:  No JVD; No carotid bruits CARDIAC: RRR, no murmurs, rubs, gallops RESPIRATORY:  Clear to auscultation without rales, wheezing or rhonchi  ABDOMEN: Soft, non-tender, non-distended EXTREMITIES:  No edema; No deformity   ASSESSMENT AND PLAN: .   Elevated CAC Elevated CAC: > 75th percentile.  His CAC score is elevated. Recommend statin therapy and asa 81 mg daily. LDL goal < 70 mg/dL. Recommend continued lifestyle modifications including healthy diet, lipid management, DM2 screening, and exercise. - R sided chest pain lasting for 5 minutes; atypical CP. EKG does not show acute ischemia or scar - fasting lipids - continue crestor 20 mg daily - start asa 81 mg daily  SOB - denies today - BNP was negative.  No anemia. EKG is normal. - can be deconditioning, he is quite sedentary. Encouraged walking  Syncope: C/f orthostasis. We discussed proper hydration, getting up slowly.      Dispo: Follow up 6 months  Signed, Tarvares Lant, Alben Spittle, MD

## 2023-02-17 NOTE — Patient Instructions (Addendum)
Medication Instructions:  - Start  Aspirin 81mg , once daily.    *If you need a refill on your cardiac medications before your next appointment, please call your pharmacy*   Lab Work: - Fasting lipids today    If you have labs (blood work) drawn today and your tests are completely normal, you will receive your results only by: MyChart Message (if you have MyChart) OR A paper copy in the mail If you have any lab test that is abnormal or we need to change your treatment, we will call you to review the results.    Follow-Up: At [facility], you and your health needs are our priority.  As part of our continuing mission to provide you with exceptional heart care, we have created designated Provider Care Teams.  These Care Teams include your primary Cardiologist (physician) and Advanced Practice Providers (APPs -  Physician Assistants and Nurse Practitioners) who all work together to provide you with the care you need, when you need it.  We recommend signing up for the patient portal called "MyChart".  Sign up information is provided on this After Visit Summary.  MyChart is used to connect with patients for Virtual Visits (Telemedicine).  Patients are able to view lab/test results, encounter notes, upcoming appointments, etc.  Non-urgent messages can be sent to your provider as well.   To learn more about what you can do with MyChart, go to [email or web address].    Your next appointment:   6 month(s)  The format for your next appointment:   In Person  Provider:   Maisie Fus, MD

## 2023-02-18 ENCOUNTER — Other Ambulatory Visit: Payer: Self-pay

## 2023-02-18 DIAGNOSIS — E785 Hyperlipidemia, unspecified: Secondary | ICD-10-CM

## 2023-02-18 LAB — LIPID PANEL
Chol/HDL Ratio: 4.6 ratio (ref 0.0–5.0)
Cholesterol, Total: 188 mg/dL (ref 100–199)
HDL: 41 mg/dL (ref >39)
LDL Chol Calc (NIH): 121 mg/dL — ABNORMAL HIGH (ref 0–99)
Triglycerides: 148 mg/dL (ref 0–149)
VLDL Cholesterol Cal: 26 mg/dL (ref 5–40)

## 2023-02-18 MED ORDER — ROSUVASTATIN CALCIUM 40 MG PO TABS: 40.0000 mg | ORAL_TABLET | Freq: Every day | ORAL | 3 refills | Status: DC

## 2023-02-18 NOTE — Progress Notes (Signed)
Called patient. Patient made aware of the lab results and Dr. Verna Czech recommendations. Verbalized understanding. No questions or concerns expressed at this time.  Prescription sent to pharmacy. Orders for lab drawn placed. Lab slip placed in outgoing mail.

## 2023-02-23 ENCOUNTER — Encounter: Payer: Self-pay | Admitting: Internal Medicine

## 2023-02-23 ENCOUNTER — Ambulatory Visit (INDEPENDENT_AMBULATORY_CARE_PROVIDER_SITE_OTHER): Payer: Medicare Other | Admitting: Internal Medicine

## 2023-02-23 VITALS — BP 112/62 | HR 83 | Temp 98.1°F | Ht 69.0 in | Wt 156.0 lb

## 2023-02-23 DIAGNOSIS — R7989 Other specified abnormal findings of blood chemistry: Secondary | ICD-10-CM

## 2023-02-23 DIAGNOSIS — E785 Hyperlipidemia, unspecified: Secondary | ICD-10-CM | POA: Diagnosis not present

## 2023-02-23 DIAGNOSIS — Z23 Encounter for immunization: Secondary | ICD-10-CM | POA: Insufficient documentation

## 2023-02-23 DIAGNOSIS — M5030 Other cervical disc degeneration, unspecified cervical region: Secondary | ICD-10-CM | POA: Insufficient documentation

## 2023-02-23 DIAGNOSIS — L219 Seborrheic dermatitis, unspecified: Secondary | ICD-10-CM

## 2023-02-23 DIAGNOSIS — N1831 Chronic kidney disease, stage 3a: Secondary | ICD-10-CM

## 2023-02-23 DIAGNOSIS — R251 Tremor, unspecified: Secondary | ICD-10-CM | POA: Diagnosis not present

## 2023-02-23 DIAGNOSIS — F5104 Psychophysiologic insomnia: Secondary | ICD-10-CM

## 2023-02-23 DIAGNOSIS — R160 Hepatomegaly, not elsewhere classified: Secondary | ICD-10-CM

## 2023-02-23 LAB — CBC WITH DIFFERENTIAL/PLATELET
Basophils Absolute: 0.1 10*3/uL (ref 0.0–0.1)
Basophils Relative: 0.8 % (ref 0.0–3.0)
Eosinophils Absolute: 0.1 10*3/uL (ref 0.0–0.7)
Eosinophils Relative: 1.5 % (ref 0.0–5.0)
HCT: 43.8 % (ref 39.0–52.0)
Hemoglobin: 14.4 g/dL (ref 13.0–17.0)
Lymphocytes Relative: 22 % (ref 12.0–46.0)
Lymphs Abs: 1.6 10*3/uL (ref 0.7–4.0)
MCHC: 32.8 g/dL (ref 30.0–36.0)
MCV: 90.6 fl (ref 78.0–100.0)
Monocytes Absolute: 0.6 10*3/uL (ref 0.1–1.0)
Monocytes Relative: 8 % (ref 3.0–12.0)
Neutro Abs: 5 10*3/uL (ref 1.4–7.7)
Neutrophils Relative %: 67.7 % (ref 43.0–77.0)
Platelets: 217 10*3/uL (ref 150.0–400.0)
RBC: 4.84 Mil/uL (ref 4.22–5.81)
RDW: 14.7 % (ref 11.5–15.5)
WBC: 7.4 10*3/uL (ref 4.0–10.5)

## 2023-02-23 LAB — HEPATIC FUNCTION PANEL
ALT: 110 U/L — ABNORMAL HIGH (ref 0–53)
AST: 64 U/L — ABNORMAL HIGH (ref 0–37)
Albumin: 4.3 g/dL (ref 3.5–5.2)
Alkaline Phosphatase: 84 U/L (ref 39–117)
Bilirubin, Direct: 0.1 mg/dL (ref 0.0–0.3)
Total Bilirubin: 0.5 mg/dL (ref 0.2–1.2)
Total Protein: 7.3 g/dL (ref 6.0–8.3)

## 2023-02-23 LAB — BASIC METABOLIC PANEL
BUN: 29 mg/dL — ABNORMAL HIGH (ref 6–23)
CO2: 27 mEq/L (ref 19–32)
Calcium: 9.5 mg/dL (ref 8.4–10.5)
Chloride: 106 mEq/L (ref 96–112)
Creatinine, Ser: 1.21 mg/dL (ref 0.40–1.50)
GFR: 59.77 mL/min — ABNORMAL LOW (ref >60.00)
Glucose, Bld: 104 mg/dL — ABNORMAL HIGH (ref 70–99)
Potassium: 4 mEq/L (ref 3.5–5.1)
Sodium: 142 mEq/L (ref 135–145)

## 2023-02-23 MED ORDER — SHINGRIX 50 MCG/0.5ML IM SUSR
0.5000 mL | Freq: Once | INTRAMUSCULAR | 1 refills | Status: AC
Start: 2023-02-23 — End: 2023-02-23

## 2023-02-23 MED ORDER — CICLOPIROX OLAMINE 0.77 % EX CREA
TOPICAL_CREAM | Freq: Two times a day (BID) | CUTANEOUS | 2 refills | Status: DC
Start: 2023-02-23 — End: 2023-07-21

## 2023-02-23 MED ORDER — ZOLPIDEM TARTRATE 5 MG PO TABS: 5.0000 mg | ORAL_TABLET | Freq: Every evening | ORAL | 1 refills | Status: DC | PRN

## 2023-02-23 MED ORDER — BOOSTRIX 5-2.5-18.5 LF-MCG/0.5 IM SUSP
0.5000 mL | Freq: Once | INTRAMUSCULAR | 0 refills | Status: AC
Start: 2023-02-23 — End: 2023-02-23

## 2023-02-23 NOTE — Patient Instructions (Signed)
Seborrheic Dermatitis, Adult Seborrheic dermatitis is a skin disease that causes red, scaly patches. It often occurs on the scalp, where it may be called dandruff. The patches may also appear on other parts of the body. Skin patches tend to occur where there are a lot of oil glands in the skin. Areas of the body that may be affected include: The scalp. The face, eyebrows, and ears. The area around a beard. Skin folds of the body. This includes the armpits, groin, and buttocks. The chest. The condition is often long-lasting (chronic). It may come and go for no known reason. It may be activated by a trigger, such as: Cold weather. Being out in the sun. Stress. Drinking alcohol. What are the causes? The cause of this condition is not known. It may be related to having too much yeast on the skin or changes in how your body's disease-fighting system (immune system) works. What increases the risk? You may be more likely to develop this condition if: You have a weak immune system. You are 72 years old or older. You have other conditions, such as: Human immunodeficiency virus (HIV) or acquired immunodeficiency virus (AIDS). Parkinson's disease. Mood disorders, such as depression. Liver problems. Obesity. What are the signs or symptoms? Symptoms of this condition include: Thick scales on the scalp. Redness on the face or in the armpits. Skin that is flaky. The flakes may be white or yellow. Skin that seems oily or dry but is not helped with moisturizers. Itching or burning in the affected areas. How is this diagnosed? This condition is diagnosed with a medical history and physical exam. A sample of your skin may be tested (skin biopsy). You may need to see a skin specialist (dermatologist). How is this treated? There is no cure for this condition, but treatment can help to manage the symptoms. You may get treatment to remove scales, lower the risk of skin infection, and reduce swelling or  itching. Treatment may include: Medicated shampoos, moisturizing creams, or ointments. Creams that reduce skin yeast. Creams that reduce swelling and irritation (steroids). Follow these instructions at home: Skin care Use any medicated shampoo, skin creams, or ointments only as told by your health care provider. Do not use skin products that contain alcohol. Take lukewarm baths or showers. Avoid very hot water. When you are outside, wear a hat and clothes that block UV light. General instructions Apply over-the-counter and prescription medicines only as told by your health care provider. Learn what triggers your symptoms so you can avoid these things. Use techniques for stress reduction, such as meditation or yoga. Do not drink alcohol if your health care provider tells you not to drink. Keep all follow-up visits. Your health care provider will check your skin to make sure the treatments are helping. Where to find more information American Academy of Dermatology: MarketingSheets.si Contact a health care provider if: Your symptoms do not get better with treatment. Your symptoms get worse. You have new symptoms. Get help right away if: Your condition quickly gets worse, even with treatment. This information is not intended to replace advice given to you by your health care provider. Make sure you discuss any questions you have with your health care provider. Document Revised: 10/17/2021 Document Reviewed: 10/17/2021 Elsevier Patient Education  2024 [employer].

## 2023-03-05 ENCOUNTER — Other Ambulatory Visit: Payer: Self-pay | Admitting: Internal Medicine

## 2023-03-05 DIAGNOSIS — Z1212 Encounter for screening for malignant neoplasm of rectum: Secondary | ICD-10-CM

## 2023-03-05 DIAGNOSIS — Z1211 Encounter for screening for malignant neoplasm of colon: Secondary | ICD-10-CM

## 2023-03-08 ENCOUNTER — Other Ambulatory Visit: Payer: Medicare Other

## 2023-03-11 ENCOUNTER — Other Ambulatory Visit: Payer: Self-pay | Admitting: Internal Medicine

## 2023-03-11 DIAGNOSIS — F5104 Psychophysiologic insomnia: Secondary | ICD-10-CM

## 2023-03-11 DIAGNOSIS — F32A Depression, unspecified: Secondary | ICD-10-CM

## 2023-03-11 DIAGNOSIS — E785 Hyperlipidemia, unspecified: Secondary | ICD-10-CM

## 2023-03-11 DIAGNOSIS — F322 Major depressive disorder, single episode, severe without psychotic features: Secondary | ICD-10-CM

## 2023-03-11 DIAGNOSIS — I700 Atherosclerosis of aorta: Secondary | ICD-10-CM

## 2023-03-12 ENCOUNTER — Other Ambulatory Visit: Payer: Self-pay | Admitting: Internal Medicine

## 2023-03-12 DIAGNOSIS — F322 Major depressive disorder, single episode, severe without psychotic features: Secondary | ICD-10-CM

## 2023-03-12 DIAGNOSIS — F5104 Psychophysiologic insomnia: Secondary | ICD-10-CM

## 2023-03-12 DIAGNOSIS — F32A Depression, unspecified: Secondary | ICD-10-CM

## 2023-03-12 MED ORDER — SERTRALINE HCL 100 MG PO TABS
150.0000 mg | ORAL_TABLET | Freq: Every morning | ORAL | 1 refills | Status: DC
Start: 2023-03-12 — End: 2024-01-13

## 2023-03-12 MED ORDER — TRAZODONE HCL 150 MG PO TABS: 150.0000 mg | ORAL_TABLET | Freq: Every evening | ORAL | 1 refills | Status: DC | PRN

## 2023-03-12 MED ORDER — RISPERIDONE 1 MG PO TABS
1.0000 mg | ORAL_TABLET | Freq: Every day | ORAL | 1 refills | Status: DC
Start: 2023-03-12 — End: 2024-01-13

## 2023-06-03 ENCOUNTER — Other Ambulatory Visit (HOSPITAL_COMMUNITY): Payer: Self-pay | Admitting: Urology

## 2023-06-03 ENCOUNTER — Telehealth: Payer: Self-pay | Admitting: Radiation Oncology

## 2023-06-03 DIAGNOSIS — C61 Malignant neoplasm of prostate: Secondary | ICD-10-CM

## 2023-06-03 NOTE — Telephone Encounter (Signed)
 Left message for patient's son (POA) to call back to schedule consult per 12/19 referral.

## 2023-06-11 ENCOUNTER — Encounter (HOSPITAL_COMMUNITY): Payer: Medicare Other

## 2023-06-16 NOTE — Progress Notes (Signed)
GU Location of Tumor / Histology: Prostate Ca  If Prostate Cancer, Gleason Score is (3 + 4) and PSA is (5.51 on 07/29/2022)  Biopsies     06/18/2023 Dr. Modena Slater NM PET (PSMA) Skull to Mid Thigh CLINICAL DATA: Prostate cancer. Biopsy 5 weeks prior. PSA equal 5.5   IMPRESSION: 1. Focal radiotracer activity in the posterior RIGHT prostate gland consistent with primary prostate adenocarcinoma. 2. No evidence of metastatic adenopathy in the pelvis or periaortic retroperitoneum. 3. No evidence of visceral metastasis or skeletal metastasis. 4. Sclerotic lesion in the LEFT femoral neck without radiotracer activity is favored benign bone island. 5.  Aortic Atherosclerosis (ICD10-I70.0).   01/15/2023 Dr. Modena Slater MR Prostate with/without Contrast CLINICAL DATA: Elevated PSA level of 5.51 on 07/29/2022.   IMPRESSION: 1. Small PI-RADS category 4 lesion of the right posterolateral peripheral zone in the mid gland and apex. Targeting data sent to [facility]. 2. Benign prostatic hypertrophy.   Past/Anticipated interventions by urology, if any:     Past/Anticipated interventions by medical oncology, if any: NA  Weight changes, if any: No  IPSS:  5 SHIM:  17  Bowel/Bladder complaints, if any:  No  Nausea/Vomiting, if any: No  Pain issues, if any:  0/10  SAFETY ISSUES: Prior radiation? No Pacemaker/ICD? No Possible current pregnancy? Male Is the patient on methotrexate? No  Current Complaints / other details:

## 2023-06-18 ENCOUNTER — Ambulatory Visit (HOSPITAL_COMMUNITY)
Admission: RE | Admit: 2023-06-18 | Discharge: 2023-06-18 | Disposition: A | Discharge status: Home / Self Care | Payer: Medicare Other | Source: Ambulatory Visit | Attending: Urology | Admitting: Urology

## 2023-06-18 DIAGNOSIS — C61 Malignant neoplasm of prostate: Secondary | ICD-10-CM | POA: Insufficient documentation

## 2023-06-18 MED ORDER — FLOTUFOLASTAT F 18 GALLIUM 296-5846 MBQ/ML IV SOLN
8.8000 | Freq: Once | INTRAVENOUS | Status: AC
  Administered 2023-06-18: 8.8 via INTRAVENOUS

## 2023-06-21 NOTE — Progress Notes (Signed)
STAT read request placed for PSMA PET for upcoming consult.

## 2023-06-22 ENCOUNTER — Telehealth: Payer: Self-pay | Admitting: *Deleted

## 2023-06-22 ENCOUNTER — Encounter: Payer: Self-pay | Admitting: Radiation Oncology

## 2023-06-22 ENCOUNTER — Telehealth: Payer: Self-pay | Admitting: Internal Medicine

## 2023-06-22 ENCOUNTER — Ambulatory Visit
Admission: RE | Admit: 2023-06-22 | Discharge: 2023-06-22 | Disposition: A | Discharge status: Home / Self Care | Payer: Medicare Other | Source: Ambulatory Visit | Attending: Radiation Oncology | Admitting: Radiation Oncology

## 2023-06-22 ENCOUNTER — Other Ambulatory Visit: Payer: Self-pay | Admitting: Urology

## 2023-06-22 VITALS — BP 103/80 | HR 107 | Temp 97.1°F | Resp 18 | Ht 68.5 in | Wt 159.4 lb

## 2023-06-22 DIAGNOSIS — C61 Malignant neoplasm of prostate: Secondary | ICD-10-CM

## 2023-06-22 NOTE — Telephone Encounter (Signed)
CALLED PATIENT TO INFORM OF PRE-SEED APPTS. FOR 07-08-23 AND IMPLANT FOR 08-02-23, SPOKE WITH PATIENT AND HE IS AWARE OF THESE APPTS.

## 2023-06-22 NOTE — Progress Notes (Signed)
Introduced myself to the patient, and his brother Arlys John, as the prostate nurse navigator.  No barriers to care identified at this time.  He is here to discuss his radiation treatment options.  I gave him my business card and asked him to call me with questions or concerns.  Verbalized understanding.

## 2023-06-22 NOTE — Telephone Encounter (Signed)
   Name: Nathan Hunt  DOB: 30-Jul-1950  MRN: [id number]  Primary Cardiologist: Maisie Fus, MD   Preoperative team, please contact this patient and set up a phone call appointment for further preoperative risk assessment. Please obtain consent and complete medication review. Thank you for your help.  I confirm that guidance regarding antiplatelet and oral anticoagulation therapy has been completed and, if necessary, noted below.  Per office protocol, if patient is without any new symptoms or concerns at the time of their virtual visit, he may hold Aspirin for 5-7 days prior to procedure. Please resume Aspirin as soon as possible postprocedure, at the discretion of the surgeon.    I also confirmed the patient resides in the state of [place]. As per [facility] Medical Board telemedicine laws, the patient must reside in the state in which the provider is licensed.   Denyce Robert, NP 06/22/2023, 3:38 PM [facility]

## 2023-06-22 NOTE — Telephone Encounter (Signed)
   Pre-operative Risk Assessment    Patient Name: Nathan Hunt  DOB: 12-10-1950 MRN: [id number]      Request for Surgical Clearance    Procedure:   Radioactive Seed Implant  Date of Surgery:  Clearance 08/02/23                                 Surgeon: Dr. Modena Slater Surgeon's Group or Practice Name: [facility] Phone number: [phone] Fax number: [phone]   Type of Clearance Requested:   - Medical  - Pharmacy:  Hold Aspirin 5 days    Type of Anesthesia:  General    Additional requests/questions:      Barbette Reichmann   06/22/2023, 3:31 PM

## 2023-06-22 NOTE — Telephone Encounter (Signed)
Left message for the pt to call back to see if he would like to schedule a sooner appt with Dr. Wyline Mood as he needs pre op clearance now. Other option is that the pt can schedule a tele appt for the pre op clearance and keep the appt with Dr. Wyline Mood, this will mean though the pt will be charged for 2 appts.

## 2023-06-22 NOTE — Progress Notes (Signed)
Radiation Oncology         [phone]) [phone] ________________________________  Initial Outpatient Consultation  Name: Nathan Hunt MRN: [id number]  Date: 06/22/2023  DOB: March 28, 1951  VO:HYWVP, Bernadene Bell, MD  Crista Elliot, MD   REFERRING PHYSICIAN: Crista Elliot, MD  DIAGNOSIS: 73 y.o. gentleman with Stage T1c adenocarcinoma of the prostate with Gleason score of 4+3, and PSA of 5.51.    ICD-10-CM   1. Malignant neoplasm of prostate (HCC)  C61       HISTORY OF PRESENT ILLNESS: Nathan Hunt is a 73 y.o. male with a diagnosis of prostate cancer. He was initially referred to Dr. Berneice Heinrich back in 10/2019 for an elevated PSA of 5.2. A repeat PSA obtained the following month showed a slight drop but persistent elevation at 4.38. He was scheduled for prostate biopsy, but the patient did not show and was then lost to follow up. More recently, he was noted to have an elevated PSA of 5.51 by his primary care physician, Dr. Yetta Barre.  Accordingly, he was referred for evaluation in urology by Dr. Alvester Morin on 11/24/22,  digital rectal examination performed at that time showed no nodules or induration. He underwent prostate MRI on 01/15/23 showing a small PI-RADS 4 lesion in the right posterolateral peripheral zone of the mid gland and apex. The patient proceeded to MRI fusion biopsy of the prostate on 05/12/23.  The prostate volume measured 50.21 cc.  Out of 16 core biopsies, 12 were positive.  The maximum Gleason score was 4+3, and this was seen in the  right base lateral and right apex lateral. Additionally, Gleason 3+4 was seen in the right mid lateral, right mid, right base, left apex, left apex lateral (small focus), left base lateral, and three cores from the MRI ROI, and Gleason 3+3 in the right apex.  He underwent staging PSMA PET scan on 06/18/23 showing no evidence of disease outside of the prostate.  The patient reviewed the biopsy results with his urologist and he has kindly been referred today  for discussion of potential radiation treatment options.  He is accompanied by his brother, Nathan Hunt, for today's visit.   PREVIOUS RADIATION THERAPY: No  PAST MEDICAL HISTORY:  Past Medical History:  Diagnosis Date   BPH (benign prostatic hyperplasia)    Depression    Hyperlipidemia    Hypertension    Insomnia    Tendonitis    Tinnitus       PAST SURGICAL HISTORY: Past Surgical History:  Procedure Laterality Date   TONSILLECTOMY  1962    FAMILY HISTORY:  Family History  Problem Relation Age of Onset   Heart disease Mother    Heart disease Father    Heart disease Sister    Bone cancer Maternal Grandfather    Bone cancer Other    Alzheimer's disease Other    Colon cancer Neg Hx     SOCIAL HISTORY:  Social History   Socioeconomic History   Marital status: Single    Spouse name: Not on file   Number of children: Not on file   Years of education: Not on file   Highest education level: Not on file  Occupational History   Not on file  Tobacco Use   Smoking status: Former    Current packs/day: 0.00    Average packs/day: 1 pack/day for 24.0 years (24.0 ttl pk-yrs)    Types: Cigarettes    Start date: 52    Quit date: 1993  Years since quitting: 32.0    Passive exposure: Past   Smokeless tobacco: Never  Vaping Use   Vaping status: Never Used  Substance and Sexual Activity   Alcohol use: Not Currently   Drug use: No   Sexual activity: Not Currently  Other Topics Concern   Not on file  Social History Narrative   Not on file   Social Drivers of Health   Financial Resource Strain: Low Risk  (12/01/2022)   Overall Financial Resource Strain (CARDIA)    Difficulty of Paying Living Expenses: Not hard at all  Food Insecurity: No Food Insecurity (06/22/2023)   Hunger Vital Sign    Worried About Running Out of Food in the Last Year: Never true    Ran Out of Food in the Last Year: Never true  Transportation Needs: No Transportation Needs (06/22/2023)   PRAPARE -  Administrator, Civil Service (Medical): No    Lack of Transportation (Non-Medical): No  Physical Activity: Inactive (12/01/2022)   Exercise Vital Sign    Days of Exercise per Week: 0 days    Minutes of Exercise per Session: 0 min  Stress: No Stress Concern Present (12/01/2022)   [employer] of Occupational Health - Occupational Stress Questionnaire    Feeling of Stress : Not at all  Social Connections: Socially Isolated (12/01/2022)   Social Connection and Isolation Panel [NHANES]    Frequency of Communication with Friends and Family: Once a week    Frequency of Social Gatherings with Friends and Family: Once a week    Attends Religious Services: Never    Database administrator or Organizations: No    Attends Banker Meetings: Never    Marital Status: Never married  Intimate Partner Violence: Not At Risk (06/22/2023)   Humiliation, Afraid, Rape, and Kick questionnaire    Fear of Current or Ex-Partner: No    Emotionally Abused: No    Physically Abused: No    Sexually Abused: No    ALLERGIES: Patient has no known allergies.  MEDICATIONS:  Current Outpatient Medications  Medication Sig Dispense Refill   aspirin EC 81 MG tablet Take 1 tablet (81 mg total) by mouth daily. Swallow whole. 30 tablet 3   risperiDONE (RISPERDAL) 1 MG tablet Take 1 tablet (1 mg total) by mouth at bedtime. 90 tablet 1   rosuvastatin (CRESTOR) 40 MG tablet Take 1 tablet (40 mg total) by mouth daily. 90 tablet 3   sertraline (ZOLOFT) 100 MG tablet Take 1.5 tablets (150 mg total) by mouth every morning. 135 tablet 1   traZODone (DESYREL) 150 MG tablet Take 1 tablet (150 mg total) by mouth at bedtime as needed for sleep. 90 tablet 1   zolpidem (AMBIEN) 5 MG tablet Take 1 tablet (5 mg total) by mouth at bedtime as needed for sleep. 90 tablet 1   ciclopirox (LOPROX) 0.77 % cream Apply topically 2 (two) times daily. (Patient not taking: Reported on 06/22/2023) 90 g 2   Vitamin D,  Ergocalciferol, (DRISDOL) 1.25 MG (50000 UNIT) CAPS capsule Take 1 capsule (50,000 Units total) by mouth every 7 (seven) days. 12 capsule 0   No current facility-administered medications for this encounter.    REVIEW OF SYSTEMS:  On review of systems, the patient reports that he is doing well overall. He denies any chest pain, shortness of breath, cough, fevers, chills, night sweats, unintended weight changes. He denies any bowel disturbances, and denies abdominal pain, nausea or vomiting. He denies any  new musculoskeletal or joint aches or pains. His IPSS was 5, indicating mild urinary symptoms. His Hunt was 21, indicating he does not have erectile dysfunction. A complete review of systems is obtained and is otherwise negative.    PHYSICAL EXAM:  Wt Readings from Last 3 Encounters:  06/22/23 159 lb 6.4 oz (72.3 kg)  02/23/23 156 lb (70.8 kg)  02/17/23 159 lb (72.1 kg)   Temp Readings from Last 3 Encounters:  06/22/23 (!) 97.1 F (36.2 C) (Temporal)  02/23/23 98.1 F (36.7 C) (Oral)  07/29/22 (!) 97.3 F (36.3 C) (Oral)   BP Readings from Last 3 Encounters:  06/22/23 103/80  02/23/23 112/62  02/17/23 110/80   Pulse Readings from Last 3 Encounters:  06/22/23 (!) 107  02/23/23 83  02/17/23 71   Pain Assessment Pain Score: 0-No pain/10  In general this is a well appearing Caucasian male in no acute distress. He's alert and oriented x4 and appropriate throughout the examination. Cardiopulmonary assessment is negative for acute distress, and he exhibits normal effort.     KPS = 100  100 - Normal; no complaints; no evidence of disease. 90   - Able to carry on normal activity; minor signs or symptoms of disease. 80   - Normal activity with effort; some signs or symptoms of disease. 66   - Cares for self; unable to carry on normal activity or to do active work. 60   - Requires occasional assistance, but is able to care for most of his personal needs. 50   - Requires considerable  assistance and frequent medical care. 40   - Disabled; requires special care and assistance. 30   - Severely disabled; hospital admission is indicated although death not imminent. 20   - Very sick; hospital admission necessary; active supportive treatment necessary. 10   - Moribund; fatal processes progressing rapidly. 0     - Dead  Karnofsky DA, Abelmann WH, Craver LS and Burchenal [facility] [phone]) The use of the nitrogen mustards in the palliative treatment of carcinoma: with particular reference to bronchogenic carcinoma Cancer 1 634-56  LABORATORY DATA:  Lab Results  Component Value Date   WBC 7.4 02/23/2023   HGB 14.4 02/23/2023   HCT 43.8 02/23/2023   MCV 90.6 02/23/2023   PLT 217.0 02/23/2023   Lab Results  Component Value Date   NA 142 02/23/2023   K 4.0 02/23/2023   CL 106 02/23/2023   CO2 27 02/23/2023   Lab Results  Component Value Date   ALT 110 (H) 02/23/2023   AST 64 (H) 02/23/2023   ALKPHOS 84 02/23/2023   BILITOT 0.5 02/23/2023     RADIOGRAPHY: NM PET (PSMA) SKULL TO MID THIGH Result Date: 06/21/2023 CLINICAL DATA:  Prostate cancer. Biopsy 5 weeks prior. PSA equal 5.5 EXAM: NUCLEAR MEDICINE PET SKULL BASE TO THIGH TECHNIQUE: 8.8 mCi Flotufolastat (Posluma) was injected intravenously. Full-ring PET imaging was performed from the skull base to thigh after the radiotracer. CT data was obtained and used for attenuation correction and anatomic localization. COMPARISON:  Prostate MRI 09/15/2022 FINDINGS: NECK No radiotracer activity in neck lymph nodes. Incidental CT finding: None. CHEST No radiotracer accumulation within mediastinal or hilar lymph nodes. No suspicious pulmonary nodules on the CT scan. Incidental CT finding: None. ABDOMEN/PELVIS Prostate: Focus of radiotracer activity in the posterior RIGHT aspect of the prostate gland with SUV max equal 5.4 on image 196. Lesion corresponds to PI-RADS 4 lesion described on comparison prostate MRI. Lymph nodes: No abnormal  radiotracer accumulation within pelvic or abdominal nodes. Liver: No evidence of liver metastasis. Incidental CT finding: Small hiatal hernia. Atherosclerotic calcification of the aorta. Subcutaneous nodule in the RIGHT groin measuring 10 mm is favored benign. SKELETON No focal activity to suggest skeletal metastasis. Sclerotic lesion in the LEFT femoral neck measuring 8 mm without radiotracer activity. IMPRESSION: 1. Focal radiotracer activity in the posterior RIGHT prostate gland consistent with primary prostate adenocarcinoma. 2. No evidence of metastatic adenopathy in the pelvis or periaortic retroperitoneum. 3. No evidence of visceral metastasis or skeletal metastasis. 4. Sclerotic lesion in the LEFT femoral neck without radiotracer activity is favored benign bone island. 5.  Aortic Atherosclerosis (ICD10-I70.0). Electronically Signed   By: Genevive Bi M.D.   On: 06/21/2023 11:35      IMPRESSION/PLAN: 1. 73 y.o. gentleman with Stage T1c adenocarcinoma of the prostate with Gleason Score of 4+3, and PSA of 5.51. We discussed the patient's workup and outlined the nature of prostate cancer in this setting. The patient's T stage, Gleason's score, and PSA put him into the unfavorable intermediate risk group. Accordingly, he is eligible for a variety of potential treatment options including brachytherapy, 5.5 weeks of external radiation, or prostatectomy. We discussed the available radiation techniques, and focused on the details and logistics of delivery. We discussed and outlined the risks, benefits, short and long-term effects associated with radiotherapy and compared and contrasted these with prostatectomy. We discussed the role of SpaceOAR gel in reducing the rectal toxicity associated with radiotherapy.  He appears to have a good understanding of his disease and our treatment recommendations which are of curative intent.  He was encouraged to ask questions that were answered to his stated  satisfaction.  At the conclusion of our conversation, the patient is interested in moving forward with brachytherapy and use of SpaceOAR gel to reduce rectal toxicity from radiotherapy.  We will share our discussion with Dr. Alvester Morin and move forward with scheduling his CT [facility] planning appointment in the near future.  The patient met briefly with Darryl Nestle in our office who will be working closely with him to coordinate OR scheduling and pre and post procedure appointments.  We will contact the pharmaceutical rep to ensure that SpaceOAR is available at the time of procedure.  We enjoyed meeting him today and look forward to continuing to participate in his care. .  We personally spent 70 minutes in this encounter including chart review, reviewing radiological studies, meeting face-to-face with the patient, entering orders and completing documentation.    Marguarite Arbour, PA-C    Margaretmary Dys, MD  [facility]  Radiation Oncology Direct Dial: [phone]  Fax: [phone] [email or web address]  Skype  [email or web address]   This document serves as a record of services personally performed by Margaretmary Dys, MD and Marcello Fennel, PA-C. It was created on their behalf by Mickie Bail, a trained medical scribe. The creation of this record is based on the scribe's personal observations and the provider's statements to them. This document has been checked and approved by the attending provider.

## 2023-06-25 NOTE — Telephone Encounter (Signed)
Lvm to call back to discuss rescheduling appointment

## 2023-06-29 NOTE — Telephone Encounter (Signed)
Our office has attempted x 3 to reach the pt to try and schedule a sooner appt with Dr. Wyline Mood as he is now needing preop clearance as well. I will update the re questing office the pt needs to call back and 1 of 2 things, either try an get sooner appt with Dr. Wyline Mood. Or option 2 keep appt with Dr. Wyline Mood and schedule televisit for preop clearance.

## 2023-07-05 ENCOUNTER — Telehealth: Payer: Self-pay | Admitting: *Deleted

## 2023-07-05 NOTE — Telephone Encounter (Signed)
Returned patient's brother's phone call, spoke with patient's brother Nathan Hunt

## 2023-07-07 ENCOUNTER — Telehealth: Payer: Self-pay | Admitting: *Deleted

## 2023-07-07 ENCOUNTER — Telehealth: Payer: Self-pay | Admitting: Internal Medicine

## 2023-07-07 ENCOUNTER — Encounter: Payer: Self-pay | Admitting: Urology

## 2023-07-07 NOTE — Telephone Encounter (Signed)
 See previous clearance encounter. Patient is returning call from pre-op to schedule a sooner appointment.

## 2023-07-07 NOTE — Progress Notes (Signed)
 Pre-seed nursing interview for a diagnosis of Stage T1c adenocarcinoma of the prostate with Gleason score of 4+3, and PSA of 5.51.  Patient identity verified x2.   Patient reports doing well. No issues conveyed at this time.  Meaningful use complete.  Urinary Management medication(s)- None Urology appointment date- 07/21/23, with Dr. Carolee at [facility]   No vitals needed for this visit: Resp 19   Ht 5' 8.5 (1.74 m)   Wt 163 lb (73.9 kg)   BMI 24.42 kg/m    This concludes the interaction.  Rosaline Minerva, LPN

## 2023-07-07 NOTE — Progress Notes (Signed)
  Radiation Oncology         ([phone]) [phone] ________________________________  Name: Nathan Hunt MRN: [id number]  Date: 07/08/2023  DOB: 12/30/1950  SIMULATION AND TREATMENT PLANNING NOTE PUBIC ARCH STUDY  RR:Gnwzd, Debby CROME, MD  Carolee Sherwood JONETTA Caidence, MD  DIAGNOSIS: 73 y.o. gentleman with Stage T1c adenocarcinoma of the prostate with Gleason score of 4+3, and PSA of 5.51.   Oncology History  Malignant neoplasm of prostate (HCC)  05/12/2023 Cancer Staging   Staging form: Prostate, AJCC 8th Edition - Clinical stage from 05/12/2023: Stage IIC (cT1c, cN0, cM0, PSA: 5.5, Grade Group: 3) - Signed by Sherwood Rise, PA-C on 06/22/2023 Histopathologic type: Adenocarcinoma, NOS Stage prefix: Initial diagnosis Prostate specific antigen (PSA) range: Less than 10 Gleason primary pattern: 4 Gleason secondary pattern: 3 Gleason score: 7 Histologic grading system: 5 grade system Number of biopsy cores examined: 16 Number of biopsy cores positive: 12 Location of positive needle core biopsies: Both sides   06/22/2023 Initial Diagnosis   Malignant neoplasm of prostate (HCC)       ICD-10-CM   1. Malignant neoplasm of prostate (HCC)  C61       COMPLEX SIMULATION:  The patient presented today for evaluation for possible prostate seed implant. He was brought to the radiation planning suite and placed supine on the CT couch. A 3-dimensional image study set was obtained in upload to the planning computer. There, on each axial slice, I contoured the prostate gland. Then, using three-dimensional radiation planning tools I reconstructed the prostate in view of the structures from the transperineal needle pathway to assess for possible pubic arch interference. In doing so, I did not appreciate any pubic arch interference. Also, the patient's prostate volume was estimated based on the drawn structure. The volume was 49 cc.  Given the pubic arch appearance and prostate volume, patient remains a good candidate  to proceed with prostate seed implant. Today, he freely provided informed written consent to proceed.    PLAN: The patient will undergo prostate seed implant.   ________________________________  Donnice LABOR. Patrcia, M.D.

## 2023-07-07 NOTE — Progress Notes (Signed)
 Radiation Oncology         [phone] ________________________________  Outpatient Follow up- Pre-seed visit  Name: Nathan Hunt MRN: [id number]  Date: 07/08/2023  DOB: [birth year]  RR:Gnwzd, Debby CROME, MD  Carolee Sherwood JONETTA Izear, MD   REFERRING PHYSICIAN: Carolee Sherwood JONETTA Stavros, MD  DIAGNOSIS: 73 y.o. gentleman with Stage T1c adenocarcinoma of the prostate with Gleason score of 4+3, and PSA of 5.51.       ICD-10-CM   1. Malignant neoplasm of prostate (HCC)  C61       HISTORY OF PRESENT ILLNESS: Nathan Hunt is a 73 y.o. male with a diagnosis of prostate cancer. He was initially referred to Dr. Alvaro back in 10/2019 for an elevated PSA of 5.2. A repeat PSA obtained the following month showed a slight drop but persistent elevation at 4.38. He was scheduled for prostate biopsy, but the patient did not show and was then lost to follow up. More recently, he was noted to have an elevated PSA of 5.51 by his primary care physician, Dr. Joshua.  Accordingly, he was referred for evaluation in urology by Dr. Carolee on 11/24/22,  digital rectal examination performed at that time showed no nodules or induration. He underwent prostate MRI on 01/15/23 showing a small PI-RADS 4 lesion in the right posterolateral peripheral zone of the mid gland and apex. The patient proceeded to MRI fusion biopsy of the prostate on 05/12/23.  The prostate volume measured 50.21 cc.  Out of 16 core biopsies, 12 were positive.  The maximum Gleason score was 4+3, and this was seen in the  right base lateral and right apex lateral. Additionally, Gleason 3+4 was seen in the right mid lateral, right mid, right base, left apex, left apex lateral (small focus), left base lateral, and three cores from the MRI ROI, and Gleason 3+3 in the right apex.   He underwent staging PSMA PET scan on 06/18/23 showing no evidence of disease outside of the prostate.  The patient reviewed the biopsy results with his urologist and was kindly referred to us   for discussion of potential radiation treatment options. We initially met the patient on 06/22/23 and he was most interested in proceeding with brachytherapy and SpaceOAR gel placement for treatment of his disease. He is here today for his pre-procedure imaging for planning and to answer any additional questions he may have about this treatment.   PREVIOUS RADIATION THERAPY: No  PAST MEDICAL HISTORY:  Past Medical History:  Diagnosis Date   BPH (benign prostatic hyperplasia)    Depression    Hyperlipidemia    Hypertension    Insomnia    Tendonitis    Tinnitus       PAST SURGICAL HISTORY: Past Surgical History:  Procedure Laterality Date   TONSILLECTOMY  1962    FAMILY HISTORY:  Family History  Problem Relation Age of Onset   Heart disease Mother    Heart disease Father    Heart disease Sister    Bone cancer Maternal Grandfather    Bone cancer Other    Alzheimer's disease Other    Colon cancer Neg Hx     SOCIAL HISTORY:  Social History   Socioeconomic History   Marital status: Single    Spouse name: Not on file   Number of children: Not on file   Years of education: Not on file   Highest education level: Not on file  Occupational History   Not on file  Tobacco Use  Smoking status: Former    Current packs/day: 0.00    Average packs/day: 1 pack/day for 24.0 years (24.0 ttl pk-yrs)    Types: Cigarettes    Start date: 20    Quit date: 68    Years since quitting: 32.1    Passive exposure: Past   Smokeless tobacco: Never  Vaping Use   Vaping status: Never Used  Substance and Sexual Activity   Alcohol use: Not Currently   Drug use: No   Sexual activity: Not Currently  Other Topics Concern   Not on file  Social History Narrative   Not on file   Social Drivers of Health   Financial Resource Strain: Low Risk  (12/01/2022)   Overall Financial Resource Strain (CARDIA)    Difficulty of Paying Living Expenses: Not hard at all  Food Insecurity: No Food  Insecurity (06/22/2023)   Hunger Vital Sign    Worried About Running Out of Food in the Last Year: Never true    Ran Out of Food in the Last Year: Never true  Transportation Needs: No Transportation Needs (06/22/2023)   PRAPARE - Administrator, Civil Service (Medical): No    Lack of Transportation (Non-Medical): No  Physical Activity: Inactive (12/01/2022)   Exercise Vital Sign    Days of Exercise per Week: 0 days    Minutes of Exercise per Session: 0 min  Stress: No Stress Concern Present (12/01/2022)   [employer] of Occupational Health - Occupational Stress Questionnaire    Feeling of Stress : Not at all  Social Connections: Socially Isolated (12/01/2022)   Social Connection and Isolation Panel [NHANES]    Frequency of Communication with Friends and Family: Once a week    Frequency of Social Gatherings with Friends and Family: Once a week    Attends Religious Services: Never    Database Administrator or Organizations: No    Attends Banker Meetings: Never    Marital Status: Never married  Intimate Partner Violence: Not At Risk (06/22/2023)   Humiliation, Afraid, Rape, and Kick questionnaire    Fear of Current or Ex-Partner: No    Emotionally Abused: No    Physically Abused: No    Sexually Abused: No    ALLERGIES: Patient has no known allergies.  MEDICATIONS:  Current Outpatient Medications  Medication Sig Dispense Refill   aspirin  EC 81 MG tablet Take 1 tablet (81 mg total) by mouth daily. Swallow whole. 30 tablet 3   ciclopirox  (LOPROX ) 0.77 % cream Apply topically 2 (two) times daily. (Patient not taking: Reported on 07/07/2023) 90 g 2   risperiDONE  (RISPERDAL ) 1 MG tablet Take 1 tablet (1 mg total) by mouth at bedtime. 90 tablet 1   rosuvastatin  (CRESTOR ) 40 MG tablet Take 1 tablet (40 mg total) by mouth daily. 90 tablet 3   sertraline  (ZOLOFT ) 100 MG tablet Take 1.5 tablets (150 mg total) by mouth every morning. 135 tablet 1   traZODone  (DESYREL )  150 MG tablet Take 1 tablet (150 mg total) by mouth at bedtime as needed for sleep. 90 tablet 1   Vitamin D , Ergocalciferol , (DRISDOL ) 1.25 MG (50000 UNIT) CAPS capsule Take 1 capsule (50,000 Units total) by mouth every 7 (seven) days. 12 capsule 0   zolpidem  (AMBIEN ) 5 MG tablet Take 1 tablet (5 mg total) by mouth at bedtime as needed for sleep. 90 tablet 1   No current facility-administered medications for this visit.    REVIEW OF SYSTEMS:  On review  of systems, the patient reports that he is doing well overall. He denies any chest pain, shortness of breath, cough, fevers, chills, night sweats, unintended weight changes. He denies any bowel disturbances, and denies abdominal pain, nausea or vomiting. He denies any new musculoskeletal or joint aches or pains. His IPSS was 5, indicating mild urinary symptoms. His SHIM was 21, indicating he does not have erectile dysfunction. A complete review of systems is obtained and is otherwise negative.     PHYSICAL EXAM:  Wt Readings from Last 3 Encounters:  06/22/23 159 lb 6.4 oz (72.3 kg)  02/23/23 156 lb (70.8 kg)  02/17/23 159 lb (72.1 kg)   Temp Readings from Last 3 Encounters:  06/22/23 (!) 97.1 F (36.2 C) (Temporal)  02/23/23 98.1 F (36.7 C) (Oral)  07/29/22 (!) 97.3 F (36.3 C) (Oral)   BP Readings from Last 3 Encounters:  06/22/23 103/80  02/23/23 112/62  02/17/23 110/80   Pulse Readings from Last 3 Encounters:  06/22/23 (!) 107  02/23/23 83  02/17/23 71    /10  In general this is a well appearing Caucasian male in no acute distress. He's alert and oriented x4 and appropriate throughout the examination. Cardiopulmonary assessment is negative for acute distress, and he exhibits normal effort.     KPS = 100  100 - Normal; no complaints; no evidence of disease. 90   - Able to carry on normal activity; minor signs or symptoms of disease. 80   - Normal activity with effort; some signs or symptoms of disease. 64   - Cares for  self; unable to carry on normal activity or to do active work. 60   - Requires occasional assistance, but is able to care for most of his personal needs. 50   - Requires considerable assistance and frequent medical care. 40   - Disabled; requires special care and assistance. 30   - Severely disabled; hospital admission is indicated although death not imminent. 20   - Very sick; hospital admission necessary; active supportive treatment necessary. 10   - Moribund; fatal processes progressing rapidly. 0     - Dead  Karnofsky DA, Abelmann WH, Craver LS and Burchenal [facility] [phone]) The use of the nitrogen mustards in the palliative treatment of carcinoma: with particular reference to bronchogenic carcinoma Cancer 1 634-56  LABORATORY DATA:  Lab Results  Component Value Date   WBC 7.4 02/23/2023   HGB 14.4 02/23/2023   HCT 43.8 02/23/2023   MCV 90.6 02/23/2023   PLT 217.0 02/23/2023   Lab Results  Component Value Date   NA 142 02/23/2023   K 4.0 02/23/2023   CL 106 02/23/2023   CO2 27 02/23/2023   Lab Results  Component Value Date   ALT 110 (H) 02/23/2023   AST 64 (H) 02/23/2023   ALKPHOS 84 02/23/2023   BILITOT 0.5 02/23/2023     RADIOGRAPHY: NM PET (PSMA) SKULL TO MID THIGH Result Date: 06/21/2023 CLINICAL DATA:  Prostate cancer. Biopsy 5 weeks prior. PSA equal 5.5 EXAM: NUCLEAR MEDICINE PET SKULL BASE TO THIGH TECHNIQUE: 8.8 mCi Flotufolastat (Posluma ) was injected intravenously. Full-ring PET imaging was performed from the skull base to thigh after the radiotracer. CT data was obtained and used for attenuation correction and anatomic localization. COMPARISON:  Prostate MRI 09/15/2022 FINDINGS: NECK No radiotracer activity in neck lymph nodes. Incidental CT finding: None. CHEST No radiotracer accumulation within mediastinal or hilar lymph nodes. No suspicious pulmonary nodules on the CT scan. Incidental CT finding: None.  ABDOMEN/PELVIS Prostate: Focus of radiotracer activity in the  posterior RIGHT aspect of the prostate gland with SUV max equal 5.4 on image 196. Lesion corresponds to PI-RADS 4 lesion described on comparison prostate MRI. Lymph nodes: No abnormal radiotracer accumulation within pelvic or abdominal nodes. Liver: No evidence of liver metastasis. Incidental CT finding: Small hiatal hernia. Atherosclerotic calcification of the aorta. Subcutaneous nodule in the RIGHT groin measuring 10 mm is favored benign. SKELETON No focal activity to suggest skeletal metastasis. Sclerotic lesion in the LEFT femoral neck measuring 8 mm without radiotracer activity. IMPRESSION: 1. Focal radiotracer activity in the posterior RIGHT prostate gland consistent with primary prostate adenocarcinoma. 2. No evidence of metastatic adenopathy in the pelvis or periaortic retroperitoneum. 3. No evidence of visceral metastasis or skeletal metastasis. 4. Sclerotic lesion in the LEFT femoral neck without radiotracer activity is favored benign bone island. 5.  Aortic Atherosclerosis (ICD10-I70.0). Electronically Signed   By: Jackquline Boxer M.D.   On: 06/21/2023 11:35      IMPRESSION/PLAN: 1. 73 y.o. gentleman with Stage T1c adenocarcinoma of the prostate with Gleason score of 4+3, and PSA of 5.51.  The patient has elected to proceed with seed implant for treatment of his disease. We reviewed the risks, benefits, short and long-term effects associated with brachytherapy and discussed the role of SpaceOAR in reducing the rectal toxicity associated with radiotherapy.  He appears to have a good understanding of his disease and our treatment recommendations which are of curative intent.  He was encouraged to ask questions that were answered to his stated satisfaction. He has freely signed written consent to proceed today in the office and a copy of this document will be placed in his medical record. His procedure is tentatively scheduled for 08/02/23 in collaboration with Dr. Carolee and we will see him back for his  post-procedure visit approximately 3 weeks thereafter. We look forward to continuing to participate in his care. He knows that he is welcome to call with any questions or concerns at any time in the interim.  I personally spent 30 minutes in this encounter including chart review, reviewing radiological studies, meeting face-to-face with the patient, entering orders and completing documentation.    Sabra MICAEL Rusk, MMS, PA-C [place]  [facility] Radiation Oncology Physician Assistant Direct Dial: [phone]  Fax: [phone]

## 2023-07-07 NOTE — Telephone Encounter (Signed)
 Called patient to remind of pre-seed appts. for 07-08-23, spoke with patient and he is aware of these appts.

## 2023-07-07 NOTE — Telephone Encounter (Signed)
 I s/w the pt and he has been scheduled tele preop appt 07/23/23. Med rec and consent are done. Pt will keep his 6 month f/u with Dr. Alvan as planned. Pt thanked me for the help.      Patient Consent for Virtual Visit        Nathan Hunt has provided verbal consent on 07/07/2023 for a virtual visit (video or telephone).   CONSENT FOR VIRTUAL VISIT FOR:  Nathan Hunt  By participating in this virtual visit I agree to the following:  I hereby voluntarily request, consent and authorize [facility] and its employed or contracted physicians, physician assistants, nurse practitioners or other licensed health care professionals (the Practitioner), to provide me with telemedicine health care services (the "Services) as deemed necessary by the treating Practitioner. I acknowledge and consent to receive the Services by the Practitioner via telemedicine. I understand that the telemedicine visit will involve communicating with the Practitioner through live audiovisual communication technology and the disclosure of certain medical information by electronic transmission. I acknowledge that I have been given the opportunity to request an in-person assessment or other available alternative prior to the telemedicine visit and am voluntarily participating in the telemedicine visit.  I understand that I have the right to withhold or withdraw my consent to the use of telemedicine in the course of my care at any time, without affecting my right to future care or treatment, and that the Practitioner or I may terminate the telemedicine visit at any time. I understand that I have the right to inspect all information obtained and/or recorded in the course of the telemedicine visit and may receive copies of available information for a reasonable fee.  I understand that some of the potential risks of receiving the Services via telemedicine include:  Delay or interruption in medical evaluation due to  technological equipment failure or disruption; Information transmitted may not be sufficient (e.g. poor resolution of images) to allow for appropriate medical decision making by the Practitioner; and/or  In rare instances, security protocols could fail, causing a breach of personal health information.  Furthermore, I acknowledge that it is my responsibility to provide information about my medical history, conditions and care that is complete and accurate to the best of my ability. I acknowledge that Practitioner's advice, recommendations, and/or decision may be based on factors not within their control, such as incomplete or inaccurate data provided by me or distortions of diagnostic images or specimens that may result from electronic transmissions. I understand that the practice of medicine is not an exact science and that Practitioner makes no warranties or guarantees regarding treatment outcomes. I acknowledge that a copy of this consent can be made available to me via my patient portal [facility] MyChart), or I can request a printed copy by calling the office of [facility].    I understand that my insurance will be billed for this visit.   I have read or had this consent read to me. I understand the contents of this consent, which adequately explains the benefits and risks of the Services being provided via telemedicine.  I have been provided ample opportunity to ask questions regarding this consent and the Services and have had my questions answered to my satisfaction. I give my informed consent for the services to be provided through the use of telemedicine in my medical care

## 2023-07-07 NOTE — Telephone Encounter (Signed)
 I s/w the pt and he has been scheduled tele preop appt 07/23/23. Med rec and consent are done. Pt will keep his 6 month f/u with Dr. Amanda Jungling as planned. Pt thanked me for the help.

## 2023-07-07 NOTE — Telephone Encounter (Signed)
 I s/w the pt; see clearance notes for further info if needed.

## 2023-07-08 ENCOUNTER — Ambulatory Visit
Admission: RE | Admit: 2023-07-08 | Discharge: 2023-07-08 | Disposition: A | Discharge status: Home / Self Care | Payer: Medicare Other | Source: Ambulatory Visit | Attending: Radiation Oncology | Admitting: Radiation Oncology

## 2023-07-08 ENCOUNTER — Ambulatory Visit
Admission: RE | Admit: 2023-07-08 | Discharge: 2023-07-08 | Disposition: A | Discharge status: Home / Self Care | Payer: Medicare Other | Source: Ambulatory Visit | Attending: Urology | Admitting: Urology

## 2023-07-08 ENCOUNTER — Encounter: Payer: Self-pay | Admitting: Urology

## 2023-07-08 VITALS — Resp 19 | Ht 68.5 in | Wt 163.0 lb

## 2023-07-08 DIAGNOSIS — C61 Malignant neoplasm of prostate: Secondary | ICD-10-CM

## 2023-07-20 ENCOUNTER — Encounter (HOSPITAL_COMMUNITY): Payer: Self-pay

## 2023-07-20 NOTE — Progress Notes (Signed)
COVID Vaccine Completed:  Yes  Date of COVID positive in last 90 days:  PCP - Sanda Linger, MD Cardiologist - Carolan Clines, MD  Chest x-ray -  EKG - 02-17-23 Epic Stress Test -  ECHO -  Cardiac Cath -  Pacemaker/ICD device last checked: Spinal Cord Stimulator: Cardiac CT - 02-14-23 Epic  Bowel Prep -   Sleep Study -  CPAP -   Fasting Blood Sugar -  Checks Blood Sugar _____ times a day  Last dose of GLP1 agonist-  N/A GLP1 instructions:  Hold 7 days before surgery    Last dose of SGLT-2 inhibitors-  N/A SGLT-2 instructions:  Hold 3 days before surgery    Blood Thinner Instructions:  Last dose:   Time: Aspirin Instructions:  ASA 81 Last Dose:  Activity level:  Can go up a flight of stairs and perform activities of daily living without stopping and without symptoms of chest pain or shortness of breath.  Able to exercise without symptoms  Unable to go up a flight of stairs without symptoms of     Anesthesia review:  Elevated CAC score, chest pain, CKD  Patient denies shortness of breath, fever, cough and chest pain at PAT appointment  Patient verbalized understanding of instructions that were given to them at the PAT appointment. Patient was also instructed that they will need to review over the PAT instructions again at home before surgery.

## 2023-07-20 NOTE — Patient Instructions (Addendum)
SURGICAL WAITING ROOM VISITATION Patients having surgery or a procedure may have no more than 2 support people in the waiting area - these visitors may rotate.    Children under the age of 44 must have an adult with them who is not the patient.  Due to an increase in RSV and influenza rates and associated hospitalizations, children ages 77 and under may not visit patients in [facility] hospitals.   If the patient needs to stay at the hospital during part of their recovery, the visitor guidelines for inpatient rooms apply. Pre-op nurse will coordinate an appropriate time for 1 support person to accompany patient in pre-op.  This support person may not rotate.    Please refer to the [facility] website for the visitor guidelines for Inpatients (after your surgery is over and you are in a regular room).       Your procedure is scheduled on: 08-02-23   Report to [facility] Main Entrance    Report to admitting at 5:15 AM   Call this number if you have problems the morning of surgery [phone]   Do not eat food or drink liquids :After Midnight.           If you have questions, please contact your surgeon's office.   FOLLOW BOWEL PREP AND ANY ADDITIONAL PRE OP INSTRUCTIONS YOU RECEIVED FROM YOUR SURGEON'S OFFICE!!!     Fleet enema the night before surgery   Oral Hygiene is also important to reduce your risk of infection.                                    Remember - BRUSH YOUR TEETH THE MORNING OF SURGERY WITH YOUR REGULAR TOOTHPASTE   Do NOT smoke after Midnight   Take these medicines the morning of surgery with A SIP OF WATER:    Rosuvastatin   Sertraline  Stop all vitamins and herbal supplements 7 days before surgery  Bring CPAP mask and tubing day of surgery.                              You may not have any metal on your body including  jewelry, and body piercing             Do not wear lotions, powders, cologne, or deodorant              Men may shave face  and neck.   Do not bring valuables to the hospital. [facility] IS NOT RESPONSIBLE   FOR VALUABLES.   Contacts, dentures or bridgework may not be worn into surgery.  DO NOT BRING YOUR HOME MEDICATIONS TO THE HOSPITAL. PHARMACY WILL DISPENSE MEDICATIONS LISTED ON YOUR MEDICATION LIST TO YOU DURING YOUR ADMISSION IN THE HOSPITAL!    Patients discharged on the day of surgery will not be allowed to drive home.  Someone NEEDS to stay with you for the first 24 hours after anesthesia.   Special Instructions: Bring a copy of your healthcare power of attorney and living will documents the day of surgery if you haven't scanned them before.              Please read over the following fact sheets you were given: IF YOU HAVE QUESTIONS ABOUT YOUR PRE-OP INSTRUCTIONS PLEASE CALL [phone] Gwen  If you received a COVID test during your pre-op  visit  it is requested that you wear a mask when out in public, stay away from anyone that may not be feeling well and notify your surgeon if you develop symptoms. If you test positive for Covid or have been in contact with anyone that has tested positive in the last 10 days please notify you surgeon.  [facility] - Preparing for Surgery Before surgery, you can play an important role.  Because skin is not sterile, your skin needs to be as free of germs as possible.  You can reduce the number of germs on your skin by washing with CHG (chlorahexidine gluconate) soap before surgery.  CHG is an antiseptic cleaner which kills germs and bonds with the skin to continue killing germs even after washing. Please DO NOT use if you have an allergy to CHG or antibacterial soaps.  If your skin becomes reddened/irritated stop using the CHG and inform your nurse when you arrive at Short Stay. Do not shave (including legs and underarms) for at least 48 hours prior to the first CHG shower.  You may shave your face/neck.  Please follow these instructions carefully:  1.  Shower with CHG  Soap the night before surgery and the  morning of surgery.  2.  If you choose to wash your hair, wash your hair first as usual with your normal  shampoo.  3.  After you shampoo, rinse your hair and body thoroughly to remove the shampoo.                             4.  Use CHG as you would any other liquid soap.  You can apply chg directly to the skin and wash.  Gently with a scrungie or clean washcloth.  5.  Apply the CHG Soap to your body ONLY FROM THE NECK DOWN.   Do   not use on face/ open                           Wound or open sores. Avoid contact with eyes, ears mouth and   genitals (private parts).                       Wash face,  Genitals (private parts) with your normal soap.             6.  Wash thoroughly, paying special attention to the area where your    surgery  will be performed.  7.  Thoroughly rinse your body with warm water from the neck down.  8.  DO NOT shower/wash with your normal soap after using and rinsing off the CHG Soap.                9.  Pat yourself dry with a clean towel.            10.  Wear clean pajamas.            11.  Place clean sheets on your bed the night of your first shower and do not  sleep with pets. Day of Surgery : Do not apply any lotions/deodorants the morning of surgery.  Please wear clean clothes to the hospital/surgery center.  FAILURE TO FOLLOW THESE INSTRUCTIONS MAY RESULT IN THE CANCELLATION OF YOUR SURGERY  PATIENT SIGNATURE_________________________________  NURSE SIGNATURE__________________________________  ________________________________________________________________________

## 2023-07-21 ENCOUNTER — Encounter (HOSPITAL_COMMUNITY)
Admission: RE | Admit: 2023-07-21 | Discharge: 2023-07-21 | Disposition: A | Discharge status: Home / Self Care | Payer: Medicare Other | Source: Ambulatory Visit | Attending: Anesthesiology | Admitting: Anesthesiology

## 2023-07-21 DIAGNOSIS — I10 Essential (primary) hypertension: Secondary | ICD-10-CM

## 2023-07-21 NOTE — Progress Notes (Unsigned)
Virtual Visit via Telephone Note   Because of Nathan Hunt co-morbid illnesses, he is at least at moderate risk for complications without adequate follow up.  This format is felt to be most appropriate for this patient at this time.  Due to technical limitations with video connection (technology), today's appointment will be conducted as an audio only telehealth visit, and [name] verbally agreed to proceed in this manner.   All issues noted in this document were discussed and addressed.  No physical exam could be performed with this format.  Evaluation Performed:  Preoperative cardiovascular risk assessment _____________   Date:  07/21/2023   Patient ID:  Nathan Hunt, DOB 1950/10/07, MRN [id number] Patient Location:  Home Provider location:   Office  Primary Care Provider:  Etta Grandchild, MD Primary Cardiologist:  Maisie Fus, MD  Chief Complaint / Patient Profile   73 y.o. y/o male with a h/o stage 3a CKD, schizophrenia/bipolar, tobacco abuse, HL, elevated coronary calcium greater than 75 percentile treated with lifestyle modifications, recommended tobacco cessation, and lipid management.   He is pending radioactive seed implantation on 08/02/2023 by Dr. Modena Slater with request for recommendation on holding aspirin for 5 days, and presents today for telephonic preoperative cardiovascular risk assessment.  History of Present Illness    Nathan Hunt is a 73 y.o. male who presents via audio/video conferencing for a telehealth visit today.  Pt was last seen in cardiology clinic on on 02/17/2023 by Dr. Carolan Clines.  At that time BRAM HOTTEL was doing well, with recommendation for lifestyle modifications for secondary management of CAD.  The patient is now pending procedure as outlined above. Since his last visit, he ***  Past Medical History    Past Medical History:  Diagnosis Date   BPH (benign prostatic hyperplasia)    Depression    Hyperlipidemia     Hypertension    Insomnia    Tendonitis    Tinnitus    Past Surgical History:  Procedure Laterality Date   TONSILLECTOMY  1962    Allergies  No Known Allergies  Home Medications    Prior to Admission medications   Medication Sig Start Date End Date Taking? Authorizing Provider  aspirin EC 81 MG tablet Take 1 tablet (81 mg total) by mouth daily. Swallow whole. 02/17/23   Maisie Fus, MD  risperiDONE (RISPERDAL) 1 MG tablet Take 1 tablet (1 mg total) by mouth at bedtime. 03/12/23   Etta Grandchild, MD  rosuvastatin (CRESTOR) 40 MG tablet Take 1 tablet (40 mg total) by mouth daily. 02/18/23 07/21/23  Maisie Fus, MD  sertraline (ZOLOFT) 100 MG tablet Take 1.5 tablets (150 mg total) by mouth every morning. 03/12/23   Etta Grandchild, MD  traZODone (DESYREL) 150 MG tablet Take 1 tablet (150 mg total) by mouth at bedtime as needed for sleep. 03/12/23   Etta Grandchild, MD  zolpidem (AMBIEN) 5 MG tablet Take 1 tablet (5 mg total) by mouth at bedtime as needed for sleep. 02/23/23   Etta Grandchild, MD    Physical Exam    Vital Signs:  EPIC TRIBBETT does not have vital signs available for review today.***  Given telephonic nature of communication, physical exam is limited. AAOx3. NAD. Normal affect.  Speech and respirations are unlabored.  Accessory Clinical Findings    None  Assessment & Plan    1.  Preoperative Cardiovascular Risk Assessment:  According to the Revised Cardiac  Risk Index (RCRI), his    His   according to the Duke Activity Status Index (DASI).   Per office protocol, if patient is without any new symptoms or concerns at the time of their virtual visit, he/she may hold ASA for 5-7 days prior to procedure. Please resume ASA as soon as possible postprocedure, at the discretion of the surgeon.    The patient was advised that if he develops new symptoms prior to surgery to contact our office to arrange for a follow-up visit, and he verbalized  understanding.  Therefore, based on ACC/AHA guidelines, patient would be at acceptable risk for the planned procedure without further cardiovascular testing. I will route this recommendation to the requesting party via Epic fax function.   Time:   Today, I have spent *** minutes with the patient with telehealth technology discussing medical history, symptoms, and management plan.     Joni Reining, NP  07/21/2023, 3:09 PM

## 2023-07-23 ENCOUNTER — Telehealth: Payer: Self-pay

## 2023-07-23 ENCOUNTER — Ambulatory Visit: Payer: Medicare Other | Attending: Cardiology

## 2023-07-23 NOTE — Telephone Encounter (Signed)
Tried calling patient to reschedule no show telephone appt no answer left a detailed message

## 2023-07-26 NOTE — Patient Instructions (Signed)
 SURGICAL WAITING ROOM VISITATION  Patients having surgery or a procedure may have no more than 2 support people in the waiting area - these visitors may rotate.    Children under the age of 3 must have an adult with them who is not the patient.  Due to an increase in RSV and influenza rates and associated hospitalizations, children ages 33 and under may not visit patients in [facility] hospitals.  Visitors with respiratory illnesses are discouraged from visiting and should remain at home.  If the patient needs to stay at the hospital during part of their recovery, the visitor guidelines for inpatient rooms apply. Pre-op nurse will coordinate an appropriate time for 1 support person to accompany patient in pre-op.  This support person may not rotate.    Please refer to the [facility] website for the visitor guidelines for Inpatients (after your surgery is over and you are in a regular room).       Your procedure is scheduled on:  08/02/2023    Report to [facility] Main Entrance    Report to admitting at  0515 AM   Call this number if you have problems the morning of surgery [phone]   Do not eat food or drink liquids  :After Midnight.             Fleets enema nite before and am of surgery.                  If you have questions, please contact your surgeon's office.   FOLLOW BOWEL PREP AND ANY ADDITIONAL PRE OP INSTRUCTIONS YOU RECEIVED FROM YOUR SURGEON'S OFFICE!!!     Oral Hygiene is also important to reduce your risk of infection.                                    Remember - BRUSH YOUR TEETH THE MORNING OF SURGERY WITH YOUR REGULAR TOOTHPASTE  DENTURES WILL BE REMOVED PRIOR TO SURGERY PLEASE DO NOT APPLY "Poly grip" OR ADHESIVES!!!   Do NOT smoke after Midnight   Stop all vitamins and herbal supplements 7 days before surgery.   Take these medicines the morning of surgery with A SIP OF WATER:  zoloft   DO NOT TAKE ANY ORAL DIABETIC MEDICATIONS DAY OF  YOUR SURGERY  Bring CPAP mask and tubing day of surgery.                              You may not have any metal on your body including hair pins, jewelry, and body piercing             Do not wear make-up, lotions, powders, perfumes/cologne, or deodorant  Do not wear nail polish including gel and S&S, artificial/acrylic nails, or any other type of covering on natural nails including finger and toenails. If you have artificial nails, gel coating, etc. that needs to be removed by a nail salon please have this removed prior to surgery or surgery may need to be canceled/ delayed if the surgeon/ anesthesia feels like they are unable to be safely monitored.   Do not shave  48 hours prior to surgery.               Men may shave face and neck.   Do not bring valuables to the hospital. [facility] IS NOT  RESPONSIBLE   FOR VALUABLES.   Contacts, glasses, dentures or bridgework may not be worn into surgery.   Bring small overnight bag day of surgery.   DO NOT BRING YOUR HOME MEDICATIONS TO THE HOSPITAL. PHARMACY WILL DISPENSE MEDICATIONS LISTED ON YOUR MEDICATION LIST TO YOU DURING YOUR ADMISSION IN THE HOSPITAL!    Patients discharged on the day of surgery will not be allowed to drive home.  Someone NEEDS to stay with you for the first 24 hours after anesthesia.   Special Instructions: Bring a copy of your healthcare power of attorney and living will documents the day of surgery if you haven't scanned them before.              Please read over the following fact sheets you were given: IF YOU HAVE QUESTIONS ABOUT YOUR PRE-OP INSTRUCTIONS PLEASE CALL [phone]   If you received a COVID test during your pre-op visit  it is requested that you wear a mask when out in public, stay away from anyone that may not be feeling well and notify your surgeon if you develop symptoms. If you test positive for Covid or have been in contact with anyone that has tested positive in the last 10 days  please notify you surgeon.    [facility] - Preparing for Surgery Before surgery, you can play an important role.  Because skin is not sterile, your skin needs to be as free of germs as possible.  You can reduce the number of germs on your skin by washing with CHG (chlorahexidine gluconate) soap before surgery.  CHG is an antiseptic cleaner which kills germs and bonds with the skin to continue killing germs even after washing. Please DO NOT use if you have an allergy to CHG or antibacterial soaps.  If your skin becomes reddened/irritated stop using the CHG and inform your nurse when you arrive at Short Stay. Do not shave (including legs and underarms) for at least 48 hours prior to the first CHG shower.  You may shave your face/neck. Please follow these instructions carefully:  1.  Shower with CHG Soap the night before surgery and the  morning of Surgery.  2.  If you choose to wash your hair, wash your hair first as usual with your  normal  shampoo.  3.  After you shampoo, rinse your hair and body thoroughly to remove the  shampoo.                           4.  Use CHG as you would any other liquid soap.  You can apply chg directly  to the skin and wash                       Gently with a scrungie or clean washcloth.  5.  Apply the CHG Soap to your body ONLY FROM THE NECK DOWN.   Do not use on face/ open                           Wound or open sores. Avoid contact with eyes, ears mouth and genitals (private parts).                       Wash face,  Genitals (private parts) with your normal soap.             6.  Wash thoroughly,  paying special attention to the area where your surgery  will be performed.  7.  Thoroughly rinse your body with warm water from the neck down.  8.  DO NOT shower/wash with your normal soap after using and rinsing off  the CHG Soap.                9.  Pat yourself dry with a clean towel.            10.  Wear clean pajamas.            11.  Place clean sheets on your bed the  night of your first shower and do not  sleep with pets. Day of Surgery : Do not apply any lotions/deodorants the morning of surgery.  Please wear clean clothes to the hospital/surgery center.  FAILURE TO FOLLOW THESE INSTRUCTIONS MAY RESULT IN THE CANCELLATION OF YOUR SURGERY PATIENT SIGNATURE_________________________________  NURSE SIGNATURE__________________________________  ________________________________________________________________________

## 2023-07-26 NOTE — Progress Notes (Signed)
 Anesthesia Review:  PCP: Sanda Linger LOV 02/23/23  Cardiologist : Corrie Dandy Branch LOV 02/17/23   PPM/ ICD: Device Orders: Rep Notified:  Chest x-ray : EKG : 02/17/23  CT Card- 10/14/22  Echo : Stress test: Cardiac Cath :   Activity level:  Sleep Study/ CPAP : Fasting Blood Sugar :      / Checks Blood Sugar -- times a day:    Blood Thinner/ Instructions /Last Dose: ASA / Instructions/ Last Dose :   81 mg aspirin    No show on 07/22/22 for telephone visit with cardiology

## 2023-07-28 ENCOUNTER — Encounter (INDEPENDENT_AMBULATORY_CARE_PROVIDER_SITE_OTHER): Payer: Medicare Other | Admitting: Nurse Practitioner

## 2023-07-28 ENCOUNTER — Other Ambulatory Visit: Payer: Self-pay

## 2023-07-28 ENCOUNTER — Encounter (HOSPITAL_COMMUNITY)
Admission: RE | Admit: 2023-07-28 | Discharge: 2023-07-28 | Disposition: A | Discharge status: Home / Self Care | Payer: Medicare Other | Source: Ambulatory Visit | Attending: Urology | Admitting: Urology

## 2023-07-28 ENCOUNTER — Encounter (HOSPITAL_COMMUNITY): Payer: Self-pay

## 2023-07-28 VITALS — BP 118/77 | HR 79 | Temp 98.2°F | Resp 16 | Ht 67.75 in | Wt 158.0 lb

## 2023-07-28 DIAGNOSIS — E785 Hyperlipidemia, unspecified: Secondary | ICD-10-CM | POA: Insufficient documentation

## 2023-07-28 DIAGNOSIS — I10 Essential (primary) hypertension: Secondary | ICD-10-CM

## 2023-07-28 DIAGNOSIS — C61 Malignant neoplasm of prostate: Secondary | ICD-10-CM | POA: Diagnosis not present

## 2023-07-28 DIAGNOSIS — I129 Hypertensive chronic kidney disease with stage 1 through stage 4 chronic kidney disease, or unspecified chronic kidney disease: Secondary | ICD-10-CM | POA: Insufficient documentation

## 2023-07-28 DIAGNOSIS — F419 Anxiety disorder, unspecified: Secondary | ICD-10-CM | POA: Insufficient documentation

## 2023-07-28 DIAGNOSIS — N183 Chronic kidney disease, stage 3 unspecified: Secondary | ICD-10-CM | POA: Diagnosis not present

## 2023-07-28 DIAGNOSIS — Z87891 Personal history of nicotine dependence: Secondary | ICD-10-CM | POA: Diagnosis not present

## 2023-07-28 DIAGNOSIS — Z01810 Encounter for preprocedural cardiovascular examination: Secondary | ICD-10-CM

## 2023-07-28 DIAGNOSIS — Z01818 Encounter for other preprocedural examination: Secondary | ICD-10-CM

## 2023-07-28 DIAGNOSIS — Z01812 Encounter for preprocedural laboratory examination: Secondary | ICD-10-CM | POA: Diagnosis present

## 2023-07-28 DIAGNOSIS — F32A Depression, unspecified: Secondary | ICD-10-CM | POA: Insufficient documentation

## 2023-07-28 HISTORY — DX: Anxiety disorder, unspecified: F41.9

## 2023-07-28 HISTORY — DX: Malignant (primary) neoplasm, unspecified: C80.1

## 2023-07-28 HISTORY — DX: Tinnitus, unspecified ear: H93.19

## 2023-07-28 HISTORY — DX: Unspecified asthma, uncomplicated: J45.909

## 2023-07-28 LAB — BASIC METABOLIC PANEL
Anion gap: 9 (ref 5–15)
BUN: 21 mg/dL (ref 8–23)
CO2: 24 mmol/L (ref 22–32)
Calcium: 9 mg/dL (ref 8.9–10.3)
Chloride: 107 mmol/L (ref 98–111)
Creatinine, Ser: 1.17 mg/dL (ref 0.61–1.24)
GFR, Estimated: >60 mL/min (ref >60)
Glucose, Bld: 126 mg/dL — ABNORMAL HIGH (ref 70–99)
Potassium: 3.8 mmol/L (ref 3.5–5.1)
Sodium: 140 mmol/L (ref 135–145)

## 2023-07-28 LAB — CBC
HCT: 45.8 % (ref 39.0–52.0)
Hemoglobin: 14.7 g/dL (ref 13.0–17.0)
MCH: 29.8 pg (ref 26.0–34.0)
MCHC: 32.1 g/dL (ref 30.0–36.0)
MCV: 92.7 fL (ref 80.0–100.0)
Platelets: 224 10*3/uL (ref 150–400)
RBC: 4.94 MIL/uL (ref 4.22–5.81)
RDW: 13.5 % (ref 11.5–15.5)
WBC: 7.4 10*3/uL (ref 4.0–10.5)
nRBC: 0 % (ref 0.0–0.2)

## 2023-07-28 NOTE — Progress Notes (Signed)
 Virtual Visit via Telephone Note   Because of Nathan Hunt co-morbid illnesses, he is at least at moderate risk for complications without adequate follow up.  This format is felt to be most appropriate for this patient at this time.  Due to technical limitations with video connection (technology), today's appointment will be conducted as an audio only telehealth visit, and Nathan Hunt verbally agreed to proceed in this manner.   All issues noted in this document were discussed and addressed.  No physical exam could be performed with this format.  Evaluation Performed:  Preoperative cardiovascular risk assessment _____________   Date:  07/28/2023   Patient ID:  Nathan Hunt, DOB 19-Aug-1950, MRN [id number] Patient Location:  Home Provider location:   Office  Primary Care Provider:  Etta Grandchild, MD Primary Cardiologist:  Nathan Fus, MD  Chief Complaint / Patient Profile   73 y.o. y/o male with a h/o elevated coronary artery calcium score, syncope, orthostatic hypotension, hyperlipidemia, CKD stage IIIa, anxiety, and depression who is pending radioactive seed implant on 08/02/2023 with Nathan Hunt of [facility] and presents today for telephonic preoperative cardiovascular risk assessment.  History of Present Illness    Nathan Hunt is a 73 y.o. male who presents via audio/video conferencing for a telehealth visit today.  Pt was last seen in cardiology clinic on 02/17/2023 by Dr. Carolan Hunt.  At that time Nathan Hunt was doing well.  The patient is now pending procedure as outlined above. Since his last visit, he has done well from a cardiac standpoint.   He denies chest pain, palpitations, dyspnea, pnd, orthopnea, n, v, dizziness, syncope, edema, weight gain, or early satiety. All other systems reviewed and are otherwise negative except as noted above.   Past Medical History    Past Medical History:  Diagnosis Date   Anxiety    Asthma    BPH  (benign prostatic hyperplasia)    Cancer (HCC)    prostate cancer   Depression    Hyperlipidemia    Insomnia    Tendonitis    Tinnitus    Tinnitus    severe per pt   Past Surgical History:  Procedure Laterality Date   TONSILLECTOMY  1962    Allergies  No Known Allergies  Home Medications    Prior to Admission medications   Medication Sig Start Date End Date Taking? Authorizing Provider  aspirin EC 81 MG tablet Take 1 tablet (81 mg total) by mouth daily. Swallow whole. 02/17/23   Nathan Fus, MD  risperiDONE (RISPERDAL) 1 MG tablet Take 1 tablet (1 mg total) by mouth at bedtime. 03/12/23   Nathan Grandchild, MD  rosuvastatin (CRESTOR) 40 MG tablet Take 1 tablet (40 mg total) by mouth daily. 02/18/23 07/21/23  Nathan Fus, MD  sertraline (ZOLOFT) 100 MG tablet Take 1.5 tablets (150 mg total) by mouth every morning. 03/12/23   Nathan Grandchild, MD  traZODone (DESYREL) 150 MG tablet Take 1 tablet (150 mg total) by mouth at bedtime as needed for sleep. 03/12/23   Nathan Grandchild, MD  zolpidem (AMBIEN) 5 MG tablet Take 1 tablet (5 mg total) by mouth at bedtime as needed for sleep. 02/23/23   Nathan Grandchild, MD    Physical Exam    Vital Signs:  Nathan Hunt does not have vital signs available for review today.  Given telephonic nature of communication, physical exam is limited. AAOx3. NAD. Normal affect.  Speech and respirations are unlabored.  Accessory Clinical Findings    None  Assessment & Plan    1.  Preoperative Cardiovascular Risk Assessment:  According to the Revised Cardiac Risk Index (RCRI), his Perioperative Risk of Major Cardiac Event is (%): 0.4. His Functional Capacity in METs is: 5.07 according to the Duke Activity Status Index (DASI). Therefore, based on ACC/AHA guidelines, patient would be at acceptable risk for the planned procedure without further cardiovascular testing.  The patient was advised that if he develops new symptoms prior to surgery to  contact our office to arrange for a follow-up visit, and he verbalized understanding.  Per office protocol,  he may hold Aspirin for 5-7 days prior to procedure. Please resume Aspirin as soon as possible postprocedure, at the discretion of the surgeon.    A copy of this note will be routed to requesting surgeon.  Time:   Today, I have spent 5 minutes with the patient with telehealth technology discussing medical history, symptoms, and management plan.     Joylene Grapes, NP  07/28/2023, 3:07 PM

## 2023-07-28 NOTE — Telephone Encounter (Signed)
 Pt calling back to r/s appt

## 2023-07-28 NOTE — Telephone Encounter (Signed)
 I will need to reach out to our preop APP, as pt will need to be added on. Procedure 08/02/23 and med hold for ASA. Pt missed his tele preop appt 07/23/23. Pt is also scheduled to see dr. Wyline Mood 08/18/23.    Patient Calls Doctor, hospital First) View All Conversations on this Encounter [place], [place] R routed conversation to Cv Div Preop Callback4 minutes ago (9:07 AM)   Uvaldo Rising, Chloe R4 minutes ago (9:07 AM)   CM Pt calling back to r/s appt       Note   Taggart, Prasad [phone]  Terese Door R4 minutes ago (9:07 AM)   Marvell Fuller, CMA5 days ago    Tried calling patient to reschedule no show telephone appt no answer left a detailed message       Note   Marvell Fuller, CMA  [place], [place] [phone]

## 2023-07-28 NOTE — Telephone Encounter (Signed)
 ADD ON OK PER EM, NP DUE TO MISSED TELE AND PROC DATE 08/02/23, WITH MED HOLD   Per Bernadene Person, NP add ok.   Pt aware to keep tele appt today for preop clearance.   Pt thanked me for our help and apologized for all the trouble trying to reach him. I told the pt no worries and we are to care for him.

## 2023-07-29 NOTE — Progress Notes (Signed)
 Anesthesia Chart Review   Case: [id number] Date/Time: 08/02/23 0715   Procedures:      RADIOACTIVE SEED IMPLANT/BRACHYTHERAPY IMPLANT - 90 MINUTE CASE     SPACE OAR INSTILLATION   Anesthesia type: General   Pre-op diagnosis: PROSTATE CANCER   Location: [facility] PROCEDURE RO[facility] / [facility]   Surgeons: Crista Elliot, MD       DISCUSSION:73 y.o. former smoker with h/o orthostatic hypotension, CKD Stage III, prostate cancer scheduled for above procedure 08/02/23 with Dr. Modena Slater.   Per cardiology preoperative evaluation 08/02/2023, "According to the Revised Cardiac Risk Index (RCRI), his Perioperative Risk of Major Cardiac Event is (%): 0.4. His Functional Capacity in METs is: 5.07 according to the Duke Activity Status Index (DASI). Therefore, based on ACC/AHA guidelines, patient would be at acceptable risk for the planned procedure without further cardiovascular testing.   The patient was advised that if he develops new symptoms prior to surgery to contact our office to arrange for a follow-up visit, and he verbalized understanding.   Per office protocol,  he may hold Aspirin for 5-7 days prior to procedure. Please resume Aspirin as soon as possible postprocedure, at the discretion of the surgeon."  VS: BP 118/77   Pulse 79   Temp 36.8 C (Oral)   Resp 16   Ht 5' 7.75" (1.721 m)   Wt 71.7 kg   SpO2 98%   BMI 24.20 kg/m   PROVIDERS: Etta Grandchild, MD is PCP    Primary Cardiologist:  Maisie Fus, MD  LABS: Labs reviewed: Acceptable for surgery. (all labs ordered are listed, but only abnormal results are displayed)  Labs Reviewed  BASIC METABOLIC PANEL - Abnormal; Notable for the following components:      Result Value   Glucose, Bld 126 (*)    All other components within normal limits  CBC     IMAGES:   EKG:   CV:  Past Medical History:  Diagnosis Date   Anxiety    Asthma    BPH (benign prostatic hyperplasia)    Cancer (HCC)    prostate cancer   Depression     Hyperlipidemia    Insomnia    Tendonitis    Tinnitus    Tinnitus    severe per pt    Past Surgical History:  Procedure Laterality Date   TONSILLECTOMY  1962    MEDICATIONS:  aspirin EC 81 MG tablet   risperiDONE (RISPERDAL) 1 MG tablet   rosuvastatin (CRESTOR) 40 MG tablet   sertraline (ZOLOFT) 100 MG tablet   traZODone (DESYREL) 150 MG tablet   zolpidem (AMBIEN) 5 MG tablet   No current facility-administered medications for this encounter.     Jodell Cipro Ward, PA-C [facility]urgical [facility] [phone]

## 2023-07-29 NOTE — Progress Notes (Signed)
 RN left message for call back to review any questions or barriers prior to upcoming brachytherapy on 3/3.

## 2023-07-30 ENCOUNTER — Telehealth: Payer: Self-pay | Admitting: *Deleted

## 2023-07-30 NOTE — Telephone Encounter (Signed)
 CALLED PATIENT TO REMIND OF PROCEDURE FOR 08-02-23, SPOKE WITH PATIENT AND HE IS AWARE OF THIS PROCEDURE

## 2023-08-01 NOTE — Anesthesia Preprocedure Evaluation (Signed)
 Anesthesia Evaluation  Patient identified by MRN, date of birth, ID band Patient awake    Reviewed: Allergy & Precautions, NPO status , Patient's Chart, lab work & pertinent test results  History of Anesthesia Complications Negative for: history of anesthetic complications  Airway Mallampati: II  TM Distance: >3 FB Neck ROM: Full    Dental  (+) Missing,    Pulmonary asthma , former smoker   Pulmonary exam normal        Cardiovascular + CAD  Normal cardiovascular exam     Neuro/Psych   Anxiety Depression Bipolar Disorder Schizophrenia  negative neurological ROS     GI/Hepatic negative GI ROS, Neg liver ROS,,,  Endo/Other  negative endocrine ROS    Renal/GU Renal InsufficiencyRenal disease  negative genitourinary   Musculoskeletal  (+) Arthritis ,    Abdominal   Peds  Hematology negative hematology ROS (+)   Anesthesia Other Findings Day of surgery medications reviewed with patient.  Reproductive/Obstetrics negative OB ROS                              Anesthesia Physical Anesthesia Plan  ASA: 2  Anesthesia Plan: General   Post-op Pain Management: Tylenol PO (pre-op)*   Induction: Intravenous  PONV Risk Score and Plan: 2 and Treatment may vary due to age or medical condition, Ondansetron, Dexamethasone and Midazolam  Airway Management Planned: Oral ETT  Additional Equipment: None  Intra-op Plan:   Post-operative Plan: Extubation in OR  Informed Consent: I have reviewed the patients History and Physical, chart, labs and discussed the procedure including the risks, benefits and alternatives for the proposed anesthesia with the patient or authorized representative who has indicated his/her understanding and acceptance.     Dental advisory given  Plan Discussed with: CRNA  Anesthesia Plan Comments:         Anesthesia Quick Evaluation

## 2023-08-02 ENCOUNTER — Ambulatory Visit (HOSPITAL_COMMUNITY): Payer: Self-pay | Admitting: Physician Assistant

## 2023-08-02 ENCOUNTER — Ambulatory Visit (HOSPITAL_COMMUNITY)

## 2023-08-02 ENCOUNTER — Ambulatory Visit (HOSPITAL_COMMUNITY)
Admission: RE | Admit: 2023-08-02 | Discharge: 2023-08-02 | Disposition: A | Discharge status: Home / Self Care | Payer: Medicare Other | Source: Ambulatory Visit | Attending: Urology | Admitting: Urology

## 2023-08-02 ENCOUNTER — Encounter (HOSPITAL_COMMUNITY)
Admission: RE | Disposition: A | Discharge status: Home / Self Care | Payer: Self-pay | Source: Ambulatory Visit | Attending: Urology

## 2023-08-02 ENCOUNTER — Encounter (HOSPITAL_COMMUNITY): Payer: Self-pay | Admitting: Urology

## 2023-08-02 ENCOUNTER — Ambulatory Visit (HOSPITAL_BASED_OUTPATIENT_CLINIC_OR_DEPARTMENT_OTHER): Payer: Self-pay | Admitting: Anesthesiology

## 2023-08-02 DIAGNOSIS — C61 Malignant neoplasm of prostate: Secondary | ICD-10-CM | POA: Diagnosis present

## 2023-08-02 DIAGNOSIS — J45909 Unspecified asthma, uncomplicated: Secondary | ICD-10-CM | POA: Insufficient documentation

## 2023-08-02 DIAGNOSIS — F319 Bipolar disorder, unspecified: Secondary | ICD-10-CM | POA: Insufficient documentation

## 2023-08-02 DIAGNOSIS — Z87891 Personal history of nicotine dependence: Secondary | ICD-10-CM | POA: Insufficient documentation

## 2023-08-02 DIAGNOSIS — I2510 Atherosclerotic heart disease of native coronary artery without angina pectoris: Secondary | ICD-10-CM | POA: Diagnosis not present

## 2023-08-02 DIAGNOSIS — F418 Other specified anxiety disorders: Secondary | ICD-10-CM

## 2023-08-02 DIAGNOSIS — N289 Disorder of kidney and ureter, unspecified: Secondary | ICD-10-CM | POA: Insufficient documentation

## 2023-08-02 DIAGNOSIS — M1990 Unspecified osteoarthritis, unspecified site: Secondary | ICD-10-CM | POA: Diagnosis not present

## 2023-08-02 SURGERY — INSERTION, RADIATION SOURCE, PROSTATE
Anesthesia: General | Site: Pelvis

## 2023-08-02 MED ORDER — PHENYLEPHRINE 80 MCG/ML (10ML) SYRINGE FOR IV PUSH (FOR BLOOD PRESSURE SUPPORT)
PREFILLED_SYRINGE | INTRAVENOUS | Status: AC
  Filled 2023-08-02: qty 10

## 2023-08-02 MED ORDER — FLEET ENEMA RE ENEM: 1.0000 | ENEMA | Freq: Once | RECTAL | Status: DC

## 2023-08-02 MED ORDER — DROPERIDOL 2.5 MG/ML IJ SOLN: 0.6250 mg | Freq: Once | INTRAMUSCULAR | Status: DC | PRN

## 2023-08-02 MED ORDER — SUGAMMADEX SODIUM 200 MG/2ML IV SOLN
INTRAVENOUS | Status: DC | PRN
  Administered 2023-08-02: 150 mg via INTRAVENOUS

## 2023-08-02 MED ORDER — LACTATED RINGERS IV SOLN: INTRAVENOUS | Status: DC

## 2023-08-02 MED ORDER — DEXAMETHASONE SODIUM PHOSPHATE 10 MG/ML IJ SOLN
INTRAMUSCULAR | Status: DC | PRN
  Administered 2023-08-02: 8 mg via INTRAVENOUS

## 2023-08-02 MED ORDER — ROCURONIUM BROMIDE 10 MG/ML (PF) SYRINGE
PREFILLED_SYRINGE | INTRAVENOUS | Status: DC | PRN
  Administered 2023-08-02: 10 mg via INTRAVENOUS
  Administered 2023-08-02: 50 mg via INTRAVENOUS

## 2023-08-02 MED ORDER — ONDANSETRON HCL 4 MG/2ML IJ SOLN
INTRAMUSCULAR | Status: DC | PRN
  Administered 2023-08-02: 4 mg via INTRAVENOUS

## 2023-08-02 MED ORDER — PHENYLEPHRINE 80 MCG/ML (10ML) SYRINGE FOR IV PUSH (FOR BLOOD PRESSURE SUPPORT)
PREFILLED_SYRINGE | INTRAVENOUS | Status: DC | PRN
  Administered 2023-08-02: 80 ug via INTRAVENOUS
  Administered 2023-08-02: 160 ug via INTRAVENOUS
  Administered 2023-08-02: 80 ug via INTRAVENOUS

## 2023-08-02 MED ORDER — LIDOCAINE HCL (PF) 2 % IJ SOLN
INTRAMUSCULAR | Status: AC
  Filled 2023-08-02: qty 5

## 2023-08-02 MED ORDER — IOHEXOL 300 MG/ML  SOLN
INTRAMUSCULAR | Status: DC | PRN
  Administered 2023-08-02: 7 mL

## 2023-08-02 MED ORDER — PROPOFOL 10 MG/ML IV BOLUS
INTRAVENOUS | Status: AC
  Filled 2023-08-02: qty 20

## 2023-08-02 MED ORDER — LIDOCAINE HCL (PF) 2 % IJ SOLN
INTRAMUSCULAR | Status: DC | PRN
  Administered 2023-08-02: 100 mg via INTRADERMAL

## 2023-08-02 MED ORDER — PROPOFOL 10 MG/ML IV BOLUS
INTRAVENOUS | Status: DC | PRN
  Administered 2023-08-02: 140 mg via INTRAVENOUS
  Administered 2023-08-02: 60 mg via INTRAVENOUS

## 2023-08-02 MED ORDER — SUGAMMADEX SODIUM 200 MG/2ML IV SOLN
INTRAVENOUS | Status: AC
  Filled 2023-08-02: qty 2

## 2023-08-02 MED ORDER — OXYCODONE HCL 5 MG PO TABS: 5.0000 mg | ORAL_TABLET | Freq: Once | ORAL | Status: DC | PRN

## 2023-08-02 MED ORDER — FENTANYL CITRATE (PF) 100 MCG/2ML IJ SOLN
INTRAMUSCULAR | Status: DC | PRN
Start: 2023-08-02 — End: 2023-08-02
  Administered 2023-08-02 (×2): 50 ug via INTRAVENOUS

## 2023-08-02 MED ORDER — DEXAMETHASONE SODIUM PHOSPHATE 10 MG/ML IJ SOLN
INTRAMUSCULAR | Status: AC
  Filled 2023-08-02: qty 1

## 2023-08-02 MED ORDER — ONDANSETRON HCL 4 MG/2ML IJ SOLN
INTRAMUSCULAR | Status: AC
  Filled 2023-08-02: qty 2

## 2023-08-02 MED ORDER — STERILE WATER FOR IRRIGATION IR SOLN
Status: DC | PRN
  Administered 2023-08-02: 1000 mL

## 2023-08-02 MED ORDER — SODIUM CHLORIDE (PF) 0.9 % IJ SOLN
INTRAMUSCULAR | Status: AC
  Filled 2023-08-02: qty 10

## 2023-08-02 MED ORDER — MIDAZOLAM HCL 2 MG/2ML IJ SOLN
INTRAMUSCULAR | Status: DC | PRN
  Administered 2023-08-02: 2 mg via INTRAVENOUS

## 2023-08-02 MED ORDER — ACETAMINOPHEN 500 MG PO TABS
1000.0000 mg | ORAL_TABLET | Freq: Once | ORAL | Status: AC
  Administered 2023-08-02: 1000 mg via ORAL
  Filled 2023-08-02: qty 2

## 2023-08-02 MED ORDER — CHLORHEXIDINE GLUCONATE 0.12 % MT SOLN
15.0000 mL | Freq: Once | OROMUCOSAL | Status: AC
Start: 2023-08-02 — End: 2023-08-02
  Administered 2023-08-02: 15 mL via OROMUCOSAL

## 2023-08-02 MED ORDER — HYDROCODONE-ACETAMINOPHEN 5-325 MG PO TABS: 1.0000 | ORAL_TABLET | Freq: Four times a day (QID) | ORAL | 0 refills | Status: DC | PRN

## 2023-08-02 MED ORDER — OXYCODONE HCL 5 MG/5ML PO SOLN: 5.0000 mg | Freq: Once | ORAL | Status: DC | PRN

## 2023-08-02 MED ORDER — CIPROFLOXACIN IN D5W 400 MG/200ML IV SOLN
400.0000 mg | INTRAVENOUS | Status: AC
  Administered 2023-08-02: 400 mg via INTRAVENOUS
  Filled 2023-08-02: qty 200

## 2023-08-02 MED ORDER — FENTANYL CITRATE PF 50 MCG/ML IJ SOSY: 25.0000 ug | PREFILLED_SYRINGE | INTRAMUSCULAR | Status: DC | PRN

## 2023-08-02 MED ORDER — MIDAZOLAM HCL 2 MG/2ML IJ SOLN
INTRAMUSCULAR | Status: AC
  Filled 2023-08-02: qty 2

## 2023-08-02 MED ORDER — FENTANYL CITRATE (PF) 100 MCG/2ML IJ SOLN
INTRAMUSCULAR | Status: AC
Start: 2023-08-02 — End: ?
  Filled 2023-08-02: qty 2

## 2023-08-02 MED ORDER — ROCURONIUM BROMIDE 10 MG/ML (PF) SYRINGE
PREFILLED_SYRINGE | INTRAVENOUS | Status: AC
  Filled 2023-08-02: qty 10

## 2023-08-02 MED ORDER — PHENYLEPHRINE HCL-NACL 20-0.9 MG/250ML-% IV SOLN
INTRAVENOUS | Status: DC | PRN
Start: 2023-08-02 — End: 2023-08-02
  Administered 2023-08-02: 40 ug/min via INTRAVENOUS

## 2023-08-02 MED ORDER — SODIUM CHLORIDE FLUSH 0.9 % IV SOLN
INTRAVENOUS | Status: DC | PRN
  Administered 2023-08-02: 10 mL

## 2023-08-02 MED ORDER — ORAL CARE MOUTH RINSE: 15.0000 mL | Freq: Once | OROMUCOSAL | Status: AC

## 2023-08-02 MED ORDER — KETAMINE HCL 50 MG/5ML IJ SOSY
PREFILLED_SYRINGE | INTRAMUSCULAR | Status: AC
  Filled 2023-08-02: qty 5

## 2023-08-02 SURGICAL SUPPLY — 30 items
BAG COUNTER SPONGE SURGICOUNT (BAG) IMPLANT
BAG URINE DRAIN 2000ML AR STRL (UROLOGICAL SUPPLIES) ×2 IMPLANT
CATH FOLEY 2WAY SLVR 5CC 16FR (CATHETERS) ×2 IMPLANT
CATH ROBINSON RED A/P 20FR (CATHETERS) ×2 IMPLANT
COVER BACK TABLE 60X90IN (DRAPES) ×2 IMPLANT
COVER MAYO STAND STRL (DRAPES) ×2 IMPLANT
COVER SURGICAL LIGHT HANDLE (MISCELLANEOUS) ×2 IMPLANT
DRAPE U-SHAPE 47X51 STRL (DRAPES) ×2 IMPLANT
DRSG TEGADERM 4X4.75 (GAUZE/BANDAGES/DRESSINGS) IMPLANT
DRSG TEGADERM 8X12 (GAUZE/BANDAGES/DRESSINGS) ×2 IMPLANT
GLOVE BIO SURGEON STRL SZ7.5 (GLOVE) ×4 IMPLANT
GOWN STRL REUS W/ TWL XL LVL3 (GOWN DISPOSABLE) ×2 IMPLANT
GRID BRACH TEMP 18GA 2.8X3X.75 (MISCELLANEOUS) ×2 IMPLANT
HOLDER FOLEY CATH W/STRAP (MISCELLANEOUS) IMPLANT
IMPL SPACEOAR SYSTEM 10ML (Spacer) ×2 IMPLANT
IMPLANT SPACEOAR SYSTEM 10ML (Spacer) ×1 IMPLANT
KIT TURNOVER KIT A (KITS) IMPLANT
MARKER SKIN DUAL TIP RULER LAB (MISCELLANEOUS) ×2 IMPLANT
NDL BRACHY 18G 5PK (NEEDLE) ×2 IMPLANT
NDL BRACHY 18G SINGLE (NEEDLE) ×2 IMPLANT
NDL PK MORGANSTERN STABILIZ (NEEDLE) IMPLANT
NEEDLE BRACHY 18G 5PK (NEEDLE) ×1 IMPLANT
NEEDLE BRACHY 18G SINGLE (NEEDLE) ×1 IMPLANT
NEEDLE PK MORGANSTERN STABILIZ (NEEDLE) IMPLANT
PACK CYSTO (CUSTOM PROCEDURE TRAY) ×2 IMPLANT
SEEDS IMPLANT
SURGILUBE 2OZ TUBE FLIPTOP (MISCELLANEOUS) ×2 IMPLANT
SYR 10ML LL (SYRINGE) ×2 IMPLANT
TOWEL OR 17X26 10 PK STRL BLUE (TOWEL DISPOSABLE) ×2 IMPLANT
UNDERPAD 30X36 HEAVY ABSORB (UNDERPADS AND DIAPERS) ×2 IMPLANT

## 2023-08-02 NOTE — Progress Notes (Signed)
  Radiation Oncology         [phone] ________________________________  Name: Nathan Hunt MRN: [id number]  Date: 08/02/2023  DOB: April 09, 1951       Prostate Seed Implant  XB:JYNWG, Bernadene Bell, MD  No ref. provider found  DIAGNOSIS:  73 y.o. gentleman with Stage T1c adenocarcinoma of the prostate with Gleason score of 4+3, and PSA of 5.51.    Oncology History  Malignant neoplasm of prostate (HCC)  05/12/2023 Cancer Staging   Staging form: Prostate, AJCC 8th Edition - Clinical stage from 05/12/2023: Stage IIC (cT1c, cN0, cM0, PSA: 5.5, Grade Group: 3) - Signed by Marcello Fennel, PA-C on 06/22/2023 Histopathologic type: Adenocarcinoma, NOS Stage prefix: Initial diagnosis Prostate specific antigen (PSA) range: Less than 10 Gleason primary pattern: 4 Gleason secondary pattern: 3 Gleason score: 7 Histologic grading system: 5 grade system Number of biopsy cores examined: 16 Number of biopsy cores positive: 12 Location of positive needle core biopsies: Both sides   06/22/2023 Initial Diagnosis   Malignant neoplasm of prostate (HCC)     No diagnosis found.  PROCEDURE: Insertion of radioactive I-125 seeds into the prostate gland.  RADIATION DOSE: 145 Gy, definitive therapy.  TECHNIQUE: CHIP CANEPA was brought to the operating room with the urologist. He was placed in the dorsolithotomy position. He was catheterized and a rectal tube was inserted. The perineum was shaved, prepped and draped. The ultrasound probe was then introduced by me into the rectum to see the prostate gland.  TREATMENT DEVICE: I attached the needle grid to the ultrasound probe stand and anchor needles were placed.  3D PLANNING: The prostate was imaged in 3D using a sagittal sweep of the prostate probe. These images were transferred to the planning computer. There, the prostate, urethra and rectum were defined on each axial reconstructed image. Then, the software created an optimized 3D plan and a few  seed positions were adjusted. The quality of the plan was reviewed using [facility] information for the target and the following two organs at risk:  Urethra and Rectum.  Then the accepted plan was printed and handed off to the radiation therapist.  Under my supervision, the custom loading of the seeds and spacers was carried out using the quick loader.  These pre-loaded needles were then placed into the needle holder.Marland Kitchen  PROSTATE VOLUME STUDY:  Using transrectal ultrasound the volume of the prostate was verified to be 45.1 cc.  SPECIAL TREATMENT PROCEDURE/SUPERVISION AND HANDLING: The pre-loaded needles were then delivered by the urologist under sagittal guidance. A total of 16 needles were used to deposit 61 seeds in the prostate gland. The individual seed activity was 0.526 mCi.  SpaceOAR:  Yes  COMPLEX SIMULATION: At the end of the procedure, an anterior radiograph of the pelvis was obtained to document seed positioning and count. Cystoscopy was performed by the urologist to check the urethra and bladder.  MICRODOSIMETRY: At the end of the procedure, the patient was emitting 0.12 mR/hr at 1 meter. Accordingly, he was considered safe for hospital discharge.  PLAN: The patient will return to the radiation oncology clinic for post implant CT dosimetry in three weeks.   ________________________________  Artist Pais Kathrynn Running, M.D.

## 2023-08-02 NOTE — Discharge Instructions (Addendum)
Antibiotics You may be given a prescription for an antibiotic to take when you arrive home. If so, be sure to take every tablet in the bottle, even if you are feeling better before the prescription is finished. If you begin itching, notice a rash or start to swell on your trunk, arms, legs and/or throat, immediately stop taking the antibiotic and call your Urologist. Diet Resume your usual diet when you return home. To keep your bowels moving easily and softly, drink prune, apple and cranberry juice at room temperature. You may also take a stool softener, such as Colace, which is available without prescription at local pharmacies. Daily activities No driving or heavy lifting for at least two days after the implant. No bike riding, horseback riding or riding lawn mowers for the first month after the implant. Any strenuous physical activity should be approved by your doctor before you resume it. Sexual relations You may resume sexual relations two weeks after the procedure. A condom should be used for the first two weeks. Your semen may be dark brown or black; this is normal and is related bleeding that may have occurred during the implant. Postoperative swelling Expect swelling and bruising of the scrotum and perineum (the area between the scrotum and anus). Both the swelling and the bruising should resolve in l or 2 weeks. Ice packs and over- the-counter medications such as Tylenol, Advil or Aleve may lessen your discomfort. Postoperative urination Most men experience burning on urination and/or urinary frequency. If this becomes bothersome, contact your Urologist.  Medication can be prescribed to relieve these problems.  It is normal to have some blood in your urine for a few days after the implant. Special instructions related to the seeds It is unlikely that you will pass an Iodine-125 seed in your urine. The seeds are silver in color and are about as large as a grain of rice. If you pass a seed,  do not handle it with your fingers. Use a spoon to place it in an envelope or jar in place this in base occluded area such as the garage or basement for return to the radiation clinic at your convenience.  Contact your doctor for Temperature greater than 101 F Increasing pain Inability to urinate Follow-up  You should have follow up with your urologist and radiation oncologist about 3 weeks after the procedure. General information regarding Iodine seeds Iodine-125 is a low energy radioactive material. It is not deeply penetrating and loses energy at short distances. Your prostate will absorb the radiation. Objects that are touched or used by the patient do not become radioactive. Body wastes (urine and stool) or body fluids (saliva, tears, semen or blood) are not radioactive. The Nuclear Regulatory Commission (NRC) has determined that no radiation precautions are needed for patients undergoing Iodine-125 seed implantation. The NRC states that such patients do not present a risk to the people around them, including young children and pregnant women. However, in keeping with the general principle that radiation exposure should be kept as low reasonably possible, we suggest the following: Children and pets should not sit on the patient's lap for the first two (2) weeks after the implant. Pregnant (or possibly pregnant) women should avoid prolonged, close contact with the patient for the first two (2) weeks after the implant. A distance of three (3) feet is acceptable. At a distance of three (3) feet, there is no limit to the length of time anyone can be with the patient.  

## 2023-08-02 NOTE — Transfer of Care (Signed)
 Immediate Anesthesia Transfer of Care Note  Patient: Nathan Hunt  Procedure(s) Performed: RADIOACTIVE SEED IMPLANT/BRACHYTHERAPY IMPLANT (Pelvis) SPACE OAR INSTILLATION (Pelvis)  Patient Location: PACU  Anesthesia Type:General  Level of Consciousness: sedated and responds to stimulation  Airway & Oxygen Therapy: Patient Spontanous Breathing  Post-op Assessment: Report given to RN  Post vital signs: stable  Last Vitals:  Vitals Value Taken Time  BP 131/74 08/02/23 0916  Temp    Pulse 74 08/02/23 0919  Resp 10 08/02/23 0919  SpO2 100 % 08/02/23 0919  Vitals shown include unfiled device data.  Last Pain:  Vitals:   08/02/23 0549  TempSrc:   PainSc: 1       Patients Stated Pain Goal: 3 (08/02/23 0549)  Complications: No notable events documented.

## 2023-08-02 NOTE — H&P (Signed)
 H&P  Chief Complaint: Prostate cancer  History of Present Illness: 73 year old male with prostate cancer presents for brachytherapy and SpaceOAR.  Past Medical History:  Diagnosis Date   Anxiety    Asthma    BPH (benign prostatic hyperplasia)    Cancer (HCC)    prostate cancer   Depression    Hyperlipidemia    Insomnia    Tendonitis    Tinnitus    Tinnitus    severe per pt   Past Surgical History:  Procedure Laterality Date   TONSILLECTOMY  1962    Home Medications:  Medications Prior to Admission  Medication Sig Dispense Refill Last Dose/Taking   aspirin EC 81 MG tablet Take 1 tablet (81 mg total) by mouth daily. Swallow whole. 30 tablet 3 Past Week   risperiDONE (RISPERDAL) 1 MG tablet Take 1 tablet (1 mg total) by mouth at bedtime. 90 tablet 1 Past Week   rosuvastatin (CRESTOR) 40 MG tablet Take 1 tablet (40 mg total) by mouth daily. 90 tablet 3 Past Week   sertraline (ZOLOFT) 100 MG tablet Take 1.5 tablets (150 mg total) by mouth every morning. 135 tablet 1 08/02/2023 at  5:00 AM   traZODone (DESYREL) 150 MG tablet Take 1 tablet (150 mg total) by mouth at bedtime as needed for sleep. 90 tablet 1 07/31/2023   zolpidem (AMBIEN) 5 MG tablet Take 1 tablet (5 mg total) by mouth at bedtime as needed for sleep. 90 tablet 1 07/31/2023   Allergies: No Known Allergies  Family History  Problem Relation Age of Onset   Heart disease Mother    Heart disease Father    Heart disease Sister    Bone cancer Maternal Grandfather    Bone cancer Other    Alzheimer's disease Other    Colon cancer Neg Hx    Social History:  reports that he quit smoking about 32 years ago. His smoking use included cigarettes. He started smoking about 56 years ago. He has a 24 pack-year smoking history. He has been exposed to tobacco smoke. He has never used smokeless tobacco. He reports that he does not currently use alcohol. He reports that he does not use drugs.  ROS: A complete review of systems was  performed.  All systems are negative except for pertinent findings as noted. ROS   Physical Exam:  Vital signs in last 24 hours: Temp:  [97.6 F (36.4 C)] 97.6 F (36.4 C) (03/03 0544) Pulse Rate:  [80] 80 (03/03 0544) Resp:  [16] 16 (03/03 0544) BP: (107)/(76) 107/76 (03/03 0544) SpO2:  [98 %] 98 % (03/03 0544) Weight:  [71.7 kg] 71.7 kg (03/03 0549) General:  Alert and oriented, No acute distress HEENT: Normocephalic, atraumatic Neck: No JVD or lymphadenopathy Cardiovascular: Regular rate and rhythm Lungs: Regular rate and effort Abdomen: Soft, nontender, nondistended, no abdominal masses Back: No CVA tenderness Extremities: No edema Neurologic: Grossly intact  Laboratory Data:  No results found for this or any previous visit (from the past 24 hours). No results found for this or any previous visit (from the past 240 hours). Creatinine: Recent Labs    07/28/23 0838  CREATININE 1.17    Impression/Assessment:  Prostate cancer  Plan:  Proceed with brachytherapy and SpaceOAR.  Risk and benefits discussed as previously documented  Ray Church, III 08/02/2023, 7:31 AM

## 2023-08-02 NOTE — Anesthesia Procedure Notes (Signed)
 Procedure Name: Intubation Date/Time: 08/02/2023 7:54 AM  Performed by: Micki Riley, CRNAPre-anesthesia Checklist: Patient identified, Emergency Drugs available, Suction available, Patient being monitored and Timeout performed Patient Re-evaluated:Patient Re-evaluated prior to induction Oxygen Delivery Method: Circle system utilized Preoxygenation: Pre-oxygenation with 100% oxygen Induction Type: IV induction Ventilation: Mask ventilation without difficulty Laryngoscope Size: Glidescope and 3 Grade View: Grade III Tube type: Oral Tube size: 7.5 mm Number of attempts: 2 Airway Equipment and Method: Stylet and Video-laryngoscopy Dental Injury: Teeth and Oropharynx as per pre-operative assessment

## 2023-08-02 NOTE — Anesthesia Postprocedure Evaluation (Signed)
 Anesthesia Post Note  Patient: MECHEL SCHUTTER  Procedure(s) Performed: RADIOACTIVE SEED IMPLANT/BRACHYTHERAPY IMPLANT (Pelvis) SPACE OAR INSTILLATION (Pelvis)     Patient location during evaluation: PACU Anesthesia Type: General Level of consciousness: awake and alert Pain management: pain level controlled Vital Signs Assessment: post-procedure vital signs reviewed and stable Respiratory status: spontaneous breathing, nonlabored ventilation and respiratory function stable Cardiovascular status: blood pressure returned to baseline Postop Assessment: no apparent nausea or vomiting Anesthetic complications: no   No notable events documented.  Last Vitals:  Vitals:   08/02/23 0945 08/02/23 1000  BP: 123/75 128/83  Pulse: 71 70  Resp: 12 18  Temp:    SpO2: 94% 96%    Last Pain:  Vitals:   08/02/23 1000  TempSrc:   PainSc: 0-No pain                 Shanda Howells

## 2023-08-02 NOTE — Op Note (Signed)
 PATIENT:  Nathan Hunt  PRE-OPERATIVE DIAGNOSIS:  Adenocarcinoma of the prostate  POST-OPERATIVE DIAGNOSIS:  Same  PROCEDURE:  1. I-125 radioactive seed implantation 2. Cystoscopy  3. Placement of SpaceOAR  SURGEON:  Surgeon(s): Rutherford Nail, MD  Radiation oncologist: Dr. Margaretmary Dys  ANESTHESIA:  General  EBL:  Minimal  DRAINS: 16 French Foley catheter  INDICATION: Nathan Hunt  Description of procedure: After informed consent the patient was brought to the major OR, placed on the table and administered general anesthesia. He was then moved to the modified lithotomy position with his perineum perpendicular to the floor. His perineum and genitalia were then sterilely prepped. An official timeout was then performed. A 16 French Foley catheter was then placed in the bladder and filled with dilute contrast, a rectal tube was placed in the rectum and the transrectal ultrasound probe was placed in the rectum and affixed to the stand. He was then sterilely draped.  Real time ultrasonography was used along with the seed planning software On[employer] Prostate. This was used to develop the seed plan including the number of needles as well as number of seeds required for complete and adequate coverage. Real-time ultrasonography was then used along with the previously developed plan and the Nucletron device to implant a total of 61 seeds using 16 needles. This proceeded without difficulty or complication.   I then proceeded with placement of SpaceOAR by introducing a needle with the bevel angled inferiorly approximately 2 cm superior to the anus. This was angled downward and under direct ultrasound was placed within the space between the prostatic capsule and rectum. This was confirmed with a small amount of sterile saline injected and this was performed under direct ultrasound. I then attached the SpaceOAR to the needle and injected this in the space between the prostate and rectum  with good placement noted.  A Foley catheter was then removed as well as the transrectal ultrasound probe and rectal probe. Flexible cystoscopy was then performed using the 17 French flexible scope which revealed a normal urethra throughout its length down to the sphincter which appeared intact. The prostatic urethra revealed bilobar hypertrophy but no evidence of obstruction, seeds, spacers or lesions. The bladder was then entered and fully and systematically inspected. The ureteral orifices were noted to be of normal configuration and position. The mucosa revealed no evidence of tumors. There were also no stones identified within the bladder. I noted no seeds or spacers on the floor of the bladder and retroflexion of the scope revealed no seeds protruding from the base of the prostate.  The cystoscope was then removed and the patient was awakened and taken to recovery room in stable and satisfactory condition. He tolerated procedure well and there were no intraoperative complications.

## 2023-08-03 ENCOUNTER — Encounter (HOSPITAL_COMMUNITY): Payer: Self-pay | Admitting: Urology

## 2023-08-18 ENCOUNTER — Ambulatory Visit: Payer: Medicare Other | Attending: Internal Medicine | Admitting: Internal Medicine

## 2023-08-18 ENCOUNTER — Encounter: Payer: Self-pay | Admitting: Internal Medicine

## 2023-08-18 VITALS — BP 122/78 | HR 100 | Ht 69.0 in | Wt 159.6 lb

## 2023-08-18 DIAGNOSIS — E785 Hyperlipidemia, unspecified: Secondary | ICD-10-CM | POA: Insufficient documentation

## 2023-08-18 MED ORDER — ROSUVASTATIN CALCIUM 40 MG PO TABS: 40.0000 mg | ORAL_TABLET | Freq: Every day | ORAL | 3 refills | Status: AC

## 2023-08-18 NOTE — Patient Instructions (Signed)
 Medication Instructions:  Your physician recommends that you continue on your current medications as directed. Please refer to the Current Medication list given to you today.  *If you need a refill on your cardiac medications before your next appointment, please call your pharmacy*   Lab Work: FASTING Lipid Panel in 6 months (prior to follow up appt) If you have labs (blood work) drawn today and your tests are completely normal, you will receive your results only by: MyChart Message (if you have MyChart) OR A paper copy in the mail If you have any lab test that is abnormal or we need to change your treatment, we will call you to review the results.   Follow-Up: At [facility], you and your health needs are our priority.  As part of our continuing mission to provide you with exceptional heart care, we have created designated Provider Care Teams.  These Care Teams include your primary Cardiologist (physician) and Advanced Practice Providers (APPs -  Physician Assistants and Nurse Practitioners) who all work together to provide you with the care you need, when you need it.  Your next appointment:   6 month(s)  Provider:   Any APP  Other Instructions

## 2023-08-18 NOTE — Progress Notes (Signed)
  Cardiology Office Note:  .   Date:  08/18/2023  ID:  Michiel Sites, DOB 1950-07-15, MRN [id number] PCP: Etta Grandchild, MD  [facility] Providers Cardiologist:  Maisie Fus, MD    History of Present Illness: .   Nathan Hunt is a 73 y.o. male  stage 3a CKD, schizophrenia/bipolar, referral for CAC agatston score of 656 at 75th percentile.     Nathan Hunt, a patient with a history of smoking and high plaque levels in his arteries, presents with occasional sharp chest pain on the right side. The pain, which started a few months ago, lasts for about five minutes and is not continuous. The patient also reports a history of fainting, particularly when transitioning from a prone position to standing. This has led to a fear of physical activity, including walking around the block, resulting in a largely sedentary lifestyle. The patient spends most of his day sitting up in bed. He has been on Crestor 20, a statin medication, for a month or two. A recent CT scan showed a high level of plaque in his arteries.      His blood pressure is well controlled 110/80 mmHg He is unclear of the medications he is on.    ROS:  per HPI otherwise negative  Interim hx 08/18/2023 He has some "sharp cp" at times, but no pressure. He denies LE edema. He is doing well.  He is here with his brother.  He is otherwise having some neck/upper back pain which limits him from standing for too long   Studies Reviewed: .          Risk Assessment/Calculations:        Physical Exam:   VS:   Vitals:   08/18/23 1358  BP: 122/78  Pulse: 100    Wt Readings from Last 3 Encounters:  08/02/23 158 lb (71.7 kg)  07/28/23 158 lb (71.7 kg)  07/07/23 163 lb (73.9 kg)    GEN: Well nourished, well developed in no acute distress NECK: No JVD; No carotid bruits CARDIAC: RRR, no murmurs, rubs, gallops RESPIRATORY:  Clear to auscultation without rales, wheezing or rhonchi  ABDOMEN: Soft, non-tender,  non-distended EXTREMITIES:  No edema; No deformity   ASSESSMENT AND PLAN: .   Elevated CAC Elevated CAC: > 75th percentile.  His CAC score is elevated. Recommend statin therapy and asa 81 mg daily. LDL goal < 70 mg/dL. Recommend continued lifestyle modifications including healthy diet, lipid management, DM2 screening, and exercise. - He denies any angina; we discussed signs of ACS - continue crestor 40 mg daily - cont asa 81 mg daily   Prior Syncope: C/f orthostasis. We discussed proper hydration, getting up slowly.      Dispo: Follow up 3 months with an APP and fasting lipids  Signed, Uriel Horkey, Alben Spittle, MD

## 2023-08-22 ENCOUNTER — Other Ambulatory Visit: Payer: Self-pay | Admitting: Urology

## 2023-08-22 DIAGNOSIS — C61 Malignant neoplasm of prostate: Secondary | ICD-10-CM

## 2023-08-23 ENCOUNTER — Telehealth: Payer: Self-pay | Admitting: *Deleted

## 2023-08-23 NOTE — Progress Notes (Signed)
 Post-seed nursing interview for a diagnosis of Malignant neoplasm of prostate (HCC) Stage IIC (cT1c, cN0, cM0, PSA: 5.5, Grade Group: 3).  Patient identity verified x2.   Patient reports ***. Patient denies all other related issues at this time.  Meaningful use complete.  I-PSS (AUA) score- *** - {(BH) RANGE ABSENT/SEVERE:20013:s} SHIM (ED) score- *** Urinary Management medication(s) *** Urology appointment date- *** with Dr. Alvester Morin at [facility]  Vitals- ***

## 2023-08-23 NOTE — Telephone Encounter (Signed)
 CALLED PATIENT  TO REMIND OF POST SEEDS AND MRI FOR 08-25-23- SPOKE WITH PATIENT'S Nathan Hunt AND HE IS AWARE OF THESE APPTS, AND THE INSTRUCTIONS- PT. TO BE NPO- 4 HRS. PRIOR TO MRI

## 2023-08-24 ENCOUNTER — Telehealth: Payer: Self-pay | Admitting: *Deleted

## 2023-08-24 NOTE — Progress Notes (Signed)
 Radiation Oncology         [phone] ________________________________  Name: Nathan Hunt MRN: [id number]  Date: 08/25/2023  DOB: 12-21-1950  Post-Seed Follow-Up Visit Note  CC: Etta Grandchild, MD  Crista Elliot, MD  Diagnosis:   73 y.o. gentleman with Stage T1c adenocarcinoma of the prostate with Gleason score of 4+3, and PSA of 5.51.     ICD-10-CM   1. Malignant neoplasm of prostate (HCC)  C61       Interval Since Last Radiation:  3 weeks 08/02/23:  Insertion of radioactive I-125 seeds into the prostate gland; 145 Gy, definitive therapy with placement of SpaceOAR gel.  Narrative:  The patient returns today for routine follow-up.  He is complaining of increased urinary frequency and urinary hesitation symptoms. He filled out a questionnaire regarding urinary function today providing and overall IPSS score of 12 characterizing his symptoms as moderate with increased frequency, urgency and nocturia. He reports a decent flow of stream and denies straining, dysuria or gross hematuria.  His pre-implant score was 5. He denies any abdominal pain but has had some increased bowel frequency with loose stools and did see some blood on the toilet tissue with wiping yesterday.  Otherwise, his energy level is satisfactory and overall, he is quite pleased with his progress to date.  ALLERGIES:  has no known allergies.  Meds: Current Outpatient Medications  Medication Sig Dispense Refill   aspirin EC 81 MG tablet Take 1 tablet (81 mg total) by mouth daily. Swallow whole. 30 tablet 3   HYDROcodone-acetaminophen (NORCO/VICODIN) 5-325 MG tablet Take 1 tablet by mouth every 6 (six) hours as needed for moderate pain (pain score 4-6). 10 tablet 0   risperiDONE (RISPERDAL) 1 MG tablet Take 1 tablet (1 mg total) by mouth at bedtime. 90 tablet 1   rosuvastatin (CRESTOR) 40 MG tablet Take 1 tablet (40 mg total) by mouth daily. 90 tablet 3   sertraline (ZOLOFT) 100 MG tablet Take 1.5 tablets (150 mg  total) by mouth every morning. 135 tablet 1   traZODone (DESYREL) 150 MG tablet Take 1 tablet (150 mg total) by mouth at bedtime as needed for sleep. 90 tablet 1   zolpidem (AMBIEN) 5 MG tablet Take 1 tablet (5 mg total) by mouth at bedtime as needed for sleep. 90 tablet 1   No current facility-administered medications for this encounter.    Physical Findings: In general this is a well appearing Caucasian male in no acute distress. He's alert and oriented x4 and appropriate throughout the examination. Cardiopulmonary assessment is negative for acute distress and he exhibits normal effort.   Lab Findings: Lab Results  Component Value Date   WBC 7.4 07/28/2023   HGB 14.7 07/28/2023   HCT 45.8 07/28/2023   MCV 92.7 07/28/2023   PLT 224 07/28/2023    Radiographic Findings:  Patient underwent CT imaging in our clinic for post implant dosimetry. The CT will be reviewed by Dr. Kathrynn Running to confirm there is an adequate distribution of radioactive seeds throughout the prostate gland and ensure that there are no seeds in or near the rectum.  We suspect the final radiation plan and dosimetry will show appropriate coverage of the prostate gland. He understands that we will call and inform him of any unexpected findings on further review of his imaging and dosimetry.  Impression/Plan: 73 y.o. gentleman with Stage T1c adenocarcinoma of the prostate with Gleason score of 4+3, and PSA of 5.51.  The patient is recovering  from the effects of radiation. His urinary symptoms should gradually improve over the next 4-6 months. We talked about this today. He is encouraged by his improvement already and is otherwise pleased with his outcome. We also talked about long-term follow-up for prostate cancer following seed implant. He understands that ongoing PSA determinations and digital rectal exams will help perform surveillance to rule out disease recurrence. He does not currently have any follow up appointments  scheduled with Dr. Alvester Morin in the urology office so we will reach out to ensure that he stays on track with surveillance PSAs going forward. He understands what to expect with his PSA measures. Patient was also educated today about some of the long-term effects from radiation including a small risk for rectal bleeding and possibly erectile dysfunction. We talked about some of the general management approaches to these potential complications. However, I did encourage the patient to contact our office or return at any point if he has questions or concerns related to his previous radiation and prostate cancer.    Marguarite Arbour, PA-C

## 2023-08-24 NOTE — Progress Notes (Signed)
  Radiation Oncology         [phone] ________________________________  Name: Nathan Hunt MRN: [id number]  Date: 08/25/2023  DOB: January 01, 1951  COMPLEX SIMULATION NOTE  NARRATIVE:  The patient was brought to the CT Simulation planning suite today following prostate seed implantation approximately one month ago.  Identity was confirmed.  All relevant records and images related to the planned course of therapy were reviewed.  Then, the patient was set-up supine.  CT images were obtained.  The CT images were loaded into the planning software.  Then the prostate and rectum were contoured.  Treatment planning then occurred.  The implanted iodine 125 seeds were identified by the physics staff for projection of radiation distribution  I have requested : 3D Simulation  I have requested a DVH of the following structures: Prostate and rectum.    ________________________________  Artist Pais Kathrynn Running, M.D.

## 2023-08-24 NOTE — Telephone Encounter (Signed)
 RETURNED PATIENT'S BROTHER'S PHONE CALL, LVM FOR A RETURN CALL

## 2023-08-25 ENCOUNTER — Encounter: Payer: Self-pay | Admitting: Urology

## 2023-08-25 ENCOUNTER — Ambulatory Visit
Admission: RE | Admit: 2023-08-25 | Discharge: 2023-08-25 | Disposition: A | Discharge status: Home / Self Care | Source: Ambulatory Visit | Attending: Radiation Oncology | Admitting: Radiation Oncology

## 2023-08-25 ENCOUNTER — Ambulatory Visit
Admission: RE | Admit: 2023-08-25 | Discharge: 2023-08-25 | Disposition: A | Discharge status: Home / Self Care | Payer: Self-pay | Source: Ambulatory Visit | Attending: Urology | Admitting: Urology

## 2023-08-25 ENCOUNTER — Ambulatory Visit (HOSPITAL_COMMUNITY)
Admission: RE | Admit: 2023-08-25 | Discharge: 2023-08-25 | Disposition: A | Discharge status: Home / Self Care | Source: Ambulatory Visit | Attending: Urology | Admitting: Urology

## 2023-08-25 VITALS — BP 92/76 | HR 76 | Resp 18 | Wt 161.4 lb

## 2023-08-25 DIAGNOSIS — Z79899 Other long term (current) drug therapy: Secondary | ICD-10-CM | POA: Insufficient documentation

## 2023-08-25 DIAGNOSIS — Z7982 Long term (current) use of aspirin: Secondary | ICD-10-CM | POA: Insufficient documentation

## 2023-08-25 DIAGNOSIS — R3911 Hesitancy of micturition: Secondary | ICD-10-CM | POA: Insufficient documentation

## 2023-08-25 DIAGNOSIS — Z923 Personal history of irradiation: Secondary | ICD-10-CM | POA: Insufficient documentation

## 2023-08-25 DIAGNOSIS — C61 Malignant neoplasm of prostate: Secondary | ICD-10-CM | POA: Insufficient documentation

## 2023-08-25 DIAGNOSIS — R350 Frequency of micturition: Secondary | ICD-10-CM | POA: Insufficient documentation

## 2023-08-27 ENCOUNTER — Encounter: Payer: Self-pay | Admitting: *Deleted

## 2023-08-27 NOTE — Progress Notes (Signed)
 Called [facility] to see when pt has appt at [facility]. Nurse Andrey Campanile told me pt will see Dr.Bell in 3 months(June). I called pt back to make him aware and also scheduled SCP appt with this RN Navigator.

## 2023-09-17 ENCOUNTER — Ambulatory Visit
Admission: RE | Admit: 2023-09-17 | Discharge: 2023-09-17 | Disposition: A | Discharge status: Home / Self Care | Source: Ambulatory Visit | Attending: Radiation Oncology | Admitting: Radiation Oncology

## 2023-09-17 ENCOUNTER — Encounter: Payer: Self-pay | Admitting: Radiation Oncology

## 2023-09-17 DIAGNOSIS — C61 Malignant neoplasm of prostate: Secondary | ICD-10-CM | POA: Insufficient documentation

## 2023-09-17 NOTE — Radiation Completion Notes (Signed)
 Patient Name: Nathan Hunt, Nathan Hunt MRN: [id number] Date of Birth: [birth year] Referring Physician: Leila Punt, M.D. Date of Service: 2023-09-17 Radiation Oncologist: Bartholome Ligas, M.D. [facility] [place]                             RADIATION ONCOLOGY END OF TREATMENT NOTE     Diagnosis: C61 Malignant neoplasm of prostate Staging on 2023-05-12: Malignant neoplasm of prostate (HCC) T=cT1c, N=cN0, M=cM0 Intent: Curative     ==========DELIVERED PLANS==========  Prostate Seed Implant Date: 2023-08-02   Plan Name: Prostate Seed Implant Site: Prostate Technique: Radioactive Seed Implant I-125 Mode: Brachytherapy Dose Per Fraction: 145 Gy Prescribed Dose (Delivered / Prescribed): 145 Gy / 145 Gy Prescribed Fxs (Delivered / Prescribed): 1 / 1     ==========ON TREATMENT VISIT DATES========== 2023-08-02     ==========UPCOMING VISITS==========

## 2023-09-28 NOTE — Progress Notes (Signed)
  Radiation Oncology         [phone] ________________________________  Name: [name] MRN: [id number]  Date: 09/17/2023  DOB: Mar 29, 1951  3D Planning Note   Prostate Brachytherapy Post-Implant Dosimetry  Diagnosis: 73 y.o. gentleman with Stage T1c adenocarcinoma of the prostate with Gleason score of 4+3, and PSA of 5.51.   Narrative: On a previous date, Nathan Hunt returned following prostate seed implantation for post implant planning. He underwent CT scan complex simulation to delineate the three-dimensional structures of the pelvis and demonstrate the radiation distribution.  Since that time, the seed localization, and complex isodose planning with dose volume histograms have now been completed.  Results:   Prostate Coverage - The dose of radiation delivered to the 90% or more of the prostate gland (D90) was 116.88% of the prescription dose. This exceeds our goal of greater than 90%. Rectal Sparing - The volume of rectal tissue receiving the prescription dose or higher was 0.0 cc. This falls under our thresholds tolerance of 1.0 cc.  Impression: The prostate seed implant appears to show adequate target coverage and appropriate rectal sparing.  Plan:  The patient will continue to follow with urology for ongoing PSA determinations. I would anticipate a high likelihood for local tumor control with minimal risk for rectal morbidity.  ________________________________  Trilby Fujisawa Lorri Rota, M.D.

## 2023-10-12 ENCOUNTER — Encounter: Payer: Self-pay | Admitting: *Deleted

## 2023-10-12 ENCOUNTER — Inpatient Hospital Stay: Attending: Adult Health | Admitting: *Deleted

## 2023-10-12 DIAGNOSIS — C61 Malignant neoplasm of prostate: Secondary | ICD-10-CM

## 2023-10-12 NOTE — Progress Notes (Signed)
 SCP reviewed and completed.NKA, meds updated. Pt denies smoking and drinking. Pt has never had colonoscopy but said he has a cologuard kit that was mailed to him. He says he never opened it. I have instructed him to do so and mail it back. He will visit with PCP for wellness checkup in July. Pt does not currently exercise but expressed he knows he need to. Nutrition and exercise were discussed in visit and how this would be beneficial for him. Pt will have upcoming labs at [facility] in June.

## 2023-10-22 ENCOUNTER — Other Ambulatory Visit: Payer: Self-pay | Admitting: Internal Medicine

## 2023-10-22 DIAGNOSIS — F5104 Psychophysiologic insomnia: Secondary | ICD-10-CM

## 2023-11-16 LAB — PSA: PSA: 0.6

## 2023-12-02 ENCOUNTER — Ambulatory Visit

## 2023-12-06 ENCOUNTER — Ambulatory Visit

## 2023-12-20 ENCOUNTER — Ambulatory Visit: Payer: Self-pay

## 2023-12-20 NOTE — Telephone Encounter (Signed)
 FYI Only or Action Required?: FYI only for provider.  Patient was last seen in primary care on 02/23/2023 by Joshua Debby CROME, MD.  Called Nurse Triage reporting Hand Injury.  Symptoms began several weeks ago.  Interventions attempted: Rest, hydration, or home remedies.  Symptoms are: unchanged.  Triage Disposition: See Physician Within 24 Hours  Patient/caregiver understands and will follow disposition?: Yes  Copied from CRM [id number]. Topic: Clinical - Red Word Triage >> Dec 20, 2023  3:27 PM [place] J wrote: [facility] that prompted transfer to Nurse Triage: Patient had became unconscious and fell in the bathroom two weeks ago. When he fell he hurt his left hand pretty badly and now it looks disfigured. He states that the pain isn't too bad as long he holds it still. Reason for Disposition  [1] MODERATE pain (e.g., interferes with normal activities) AND [2] high-risk adult (e.g., age > 60 years, osteoporosis, chronic steroid use)  Answer Assessment - Initial Assessment Questions 1. MECHANISM: How did the injury happen?     Passed out two weeks ago 2. ONSET: When did the injury happen? (e.g., minutes, hours ago)      Two weeks ago 3. APPEARANCE of INJURY: What does the injury look like?      Bruising and swelling 4. SEVERITY: Can you use your hand normally? Can you bend your fingers into a ball and then fully open them?    Yes but painful 5. SIZE: For cuts, bruises, or swelling, ask: How large is it? (e.g., inches or centimeters; entire hand)      Had bleeding to nose.  6. PAIN: How bad is the pain? (Scale 0-10; or none, mild, moderate, severe)    None up to 8/10 with movement, sharp when moving hand.  7. OTHER SYMPTOMS: Do you have any other symptoms?      none  Additional info: Fell two weeks ago, hit face, had 5 dots of blood on the floor.  Unsure how long he was unconscious. Noted left hand injury after fall.  Did not seek evaluation  for fall, loc, or hand  injury. He thinks he lost consciousness from 1st dose of Hydrocodone , he has not taken any since the fall. Besides hand no other injury or symptoms.  Protocols used: Hand Injury-A-AH

## 2023-12-21 ENCOUNTER — Ambulatory Visit (INDEPENDENT_AMBULATORY_CARE_PROVIDER_SITE_OTHER): Admitting: Emergency Medicine

## 2023-12-21 ENCOUNTER — Encounter: Payer: Self-pay | Admitting: Emergency Medicine

## 2023-12-21 ENCOUNTER — Ambulatory Visit: Payer: Self-pay | Admitting: Emergency Medicine

## 2023-12-21 ENCOUNTER — Ambulatory Visit (INDEPENDENT_AMBULATORY_CARE_PROVIDER_SITE_OTHER)

## 2023-12-21 VITALS — BP 100/88 | HR 82 | Temp 98.4°F | Ht 69.0 in | Wt 163.0 lb

## 2023-12-21 DIAGNOSIS — S6992XA Unspecified injury of left wrist, hand and finger(s), initial encounter: Secondary | ICD-10-CM | POA: Insufficient documentation

## 2023-12-21 DIAGNOSIS — R55 Syncope and collapse: Secondary | ICD-10-CM | POA: Insufficient documentation

## 2023-12-21 LAB — CBC WITH DIFFERENTIAL/PLATELET
Basophils Absolute: 0.1 K/uL (ref 0.0–0.1)
Basophils Relative: 0.8 % (ref 0.0–3.0)
Eosinophils Absolute: 0.1 K/uL (ref 0.0–0.7)
Eosinophils Relative: 0.7 % (ref 0.0–5.0)
HCT: 43 % (ref 39.0–52.0)
Hemoglobin: 14.3 g/dL (ref 13.0–17.0)
Lymphocytes Relative: 12.6 % (ref 12.0–46.0)
Lymphs Abs: 1.1 K/uL (ref 0.7–4.0)
MCHC: 33.3 g/dL (ref 30.0–36.0)
MCV: 88.3 fl (ref 78.0–100.0)
Monocytes Absolute: 0.7 K/uL (ref 0.1–1.0)
Monocytes Relative: 8.3 % (ref 3.0–12.0)
Neutro Abs: 6.4 K/uL (ref 1.4–7.7)
Neutrophils Relative %: 77.6 % — ABNORMAL HIGH (ref 43.0–77.0)
Platelets: 234 K/uL (ref 150.0–400.0)
RBC: 4.87 Mil/uL (ref 4.22–5.81)
RDW: 15.4 % (ref 11.5–15.5)
WBC: 8.3 K/uL (ref 4.0–10.5)

## 2023-12-21 LAB — COMPREHENSIVE METABOLIC PANEL WITH GFR
ALT: 37 U/L (ref 0–53)
AST: 30 U/L (ref 0–37)
Albumin: 4.5 g/dL (ref 3.5–5.2)
Alkaline Phosphatase: 110 U/L (ref 39–117)
BUN: 23 mg/dL (ref 6–23)
CO2: 26 meq/L (ref 19–32)
Calcium: 9.2 mg/dL (ref 8.4–10.5)
Chloride: 106 meq/L (ref 96–112)
Creatinine, Ser: 1.2 mg/dL (ref 0.40–1.50)
GFR: 60.02 mL/min (ref >60.00)
Glucose, Bld: 111 mg/dL — ABNORMAL HIGH (ref 70–99)
Potassium: 4.1 meq/L (ref 3.5–5.1)
Sodium: 140 meq/L (ref 135–145)
Total Bilirubin: 0.7 mg/dL (ref 0.2–1.2)
Total Protein: 7.4 g/dL (ref 6.0–8.3)

## 2023-12-21 NOTE — Progress Notes (Signed)
 Nathan Hunt 73 y.o.   Chief Complaint  Patient presents with   Hand Injury    Pt had a recent fall and does not remember falling in the bathroom. Injuries were: hit his head, left eye and left cheek bone and his right hand but does not remember this at all. He think that he may passed out from the pain medication hydrocodone   he only took one     HISTORY OF PRESENT ILLNESS: Acute problem visit today.  Patient of Dr. Debby Molt This is a 73 y.o. male fell at home 2 weeks ago about 1 hour after taking 1 tablet of hydrocodone  Sustained minor facial injuries.  Not sure how long he was out for. Was not postictal.  Injured left hand during the fall.  Complaining of pain and swelling. No other complaints or medical concerns today.  Hand Injury  Pertinent negatives include no chest pain.     Prior to Admission medications   Medication Sig Start Date End Date Taking? Authorizing Provider  aspirin  EC 81 MG tablet Take 1 tablet (81 mg total) by mouth daily. Swallow whole. 02/17/23  Yes BranchRonal BRAVO, MD  risperiDONE  (RISPERDAL ) 1 MG tablet Take 1 tablet (1 mg total) by mouth at bedtime. 03/12/23  Yes Molt Debby CROME, MD  rosuvastatin  (CRESTOR ) 40 MG tablet Take 1 tablet (40 mg total) by mouth daily. 08/18/23 12/21/23 Yes BranchRonal BRAVO, MD  sertraline  (ZOLOFT ) 100 MG tablet Take 1.5 tablets (150 mg total) by mouth every morning. 03/12/23  Yes Molt Debby CROME, MD  zolpidem  (AMBIEN ) 5 MG tablet Take 1 tablet (5 mg total) by mouth at bedtime as needed for sleep. 10/22/23  Yes Douglass Caul B, FNP    No Known Allergies  Patient Active Problem List   Diagnosis Date Noted   Malignant neoplasm of prostate (HCC) 06/22/2023   Need for prophylactic vaccination with combined diphtheria-tetanus-pertussis (DTP) vaccine 02/23/2023   Need for prophylactic vaccination and inoculation against varicella 02/23/2023   Chronic seborrheic dermatitis 02/23/2023   DDD (degenerative disc disease), cervical  02/23/2023   Flu vaccine need 02/23/2023   Agatston CAC score, >400 10/14/2022   Tremor of both hands 07/29/2022   Dyslipidemia, goal LDL below 100 07/29/2022   Screen for colon cancer 07/29/2022   Need for vaccination 07/29/2022   Neck pain, chronic 07/29/2022   Stage 3a chronic kidney disease (HCC) 07/29/2022   Sleep disturbance 10/28/2021   Vitamin B deficiency 10/28/2021   Abnormal LFTs 10/28/2021   Mild protein-calorie malnutrition (HCC) 10/28/2021   Aortic atherosclerosis (HCC) 09/16/2020   Bipolar disorder with severe mania (HCC) 09/10/2019   Seasonal allergies 09/10/2019   Schizophrenia (HCC) 09/09/2019   Psychophysiological insomnia 09/06/2015   Allergic rhinitis due to pollen 03/25/2015   Tinnitus of both ears 09/19/2012   Depression 09/19/2012   BPH (benign prostatic hyperplasia) 09/19/2012    Past Medical History:  Diagnosis Date   Anxiety    Asthma    BPH (benign prostatic hyperplasia)    Cancer (HCC)    prostate cancer   Depression    Hyperlipidemia    Insomnia    Tendonitis    Tinnitus    Tinnitus    severe per pt    Past Surgical History:  Procedure Laterality Date   RADIOACTIVE SEED IMPLANT N/A 08/02/2023   Procedure: INSERTION, RADIOACTIVE SEED;  Surgeon: Carolee Sherwood JONETTA Najeh, MD;  Location: [facility];  Service: Urology;  Laterality: N/A;  90 MINUTE CASE  SPACE OAR INSTILLATION N/A 08/02/2023   Procedure: INSERTION, BRACHYTHERAPY DEVICE, PROSTATE;  Surgeon: Carolee Sherwood JONETTA Brian, MD;  Location: [facility];  Service: Urology;  Laterality: N/A;   TONSILLECTOMY  1962    Social History   Socioeconomic History   Marital status: Single    Spouse name: Not on file   Number of children: Not on file   Years of education: Not on file   Highest education level: Not on file  Occupational History   Not on file  Tobacco Use   Smoking status: Former    Current packs/day: 0.00    Average packs/day: 1 pack/day for 24.0 years (24.0 ttl pk-yrs)    Types: Cigarettes     Start date: 104    Quit date: 24    Years since quitting: 32.5    Passive exposure: Past   Smokeless tobacco: Never  Vaping Use   Vaping status: Never Used  Substance and Sexual Activity   Alcohol use: Not Currently   Drug use: No   Sexual activity: Not Currently  Other Topics Concern   Not on file  Social History Narrative   Not on file   Social Drivers of Health   Financial Resource Strain: Low Risk  (12/01/2022)   Overall Financial Resource Strain (CARDIA)    Difficulty of Paying Living Expenses: Not hard at all  Food Insecurity: No Food Insecurity (06/22/2023)   Hunger Vital Sign    Worried About Running Out of Food in the Last Year: Never true    Ran Out of Food in the Last Year: Never true  Transportation Needs: No Transportation Needs (06/22/2023)   PRAPARE - Administrator, Civil Service (Medical): No    Lack of Transportation (Non-Medical): No  Physical Activity: Inactive (12/01/2022)   Exercise Vital Sign    Days of Exercise per Week: 0 days    Minutes of Exercise per Session: 0 min  Stress: No Stress Concern Present (12/01/2022)   [employer] of Occupational Health - Occupational Stress Questionnaire    Feeling of Stress : Not at all  Social Connections: Socially Isolated (12/01/2022)   Social Connection and Isolation Panel    Frequency of Communication with Friends and Family: Once a week    Frequency of Social Gatherings with Friends and Family: Once a week    Attends Religious Services: Never    Database administrator or Organizations: No    Attends Banker Meetings: Never    Marital Status: Never married  Intimate Partner Violence: Not At Risk (06/22/2023)   Humiliation, Afraid, Rape, and Kick questionnaire    Fear of Current or Ex-Partner: No    Emotionally Abused: No    Physically Abused: No    Sexually Abused: No    Family History  Problem Relation Age of Onset   Heart disease Mother    Heart disease Father    Heart  disease Sister    Bone cancer Maternal Grandfather    Bone cancer Other    Alzheimer's disease Other    Colon cancer Neg Hx      Review of Systems  Constitutional: Negative.  Negative for chills and fever.  HENT: Negative.  Negative for congestion and sore throat.   Respiratory: Negative.  Negative for cough and shortness of breath.   Cardiovascular: Negative.  Negative for chest pain and palpitations.  Gastrointestinal:  Negative for abdominal pain, diarrhea, nausea and vomiting.  Genitourinary: Negative.  Negative for dysuria and  hematuria.  Skin: Negative.  Negative for rash.  Neurological: Negative.  Negative for dizziness and headaches.  All other systems reviewed and are negative.   Vitals:   12/21/23 1423  BP: 100/88  Pulse: 82  Temp: 98.4 F (36.9 C)  SpO2: 98%    Physical Exam Vitals reviewed.  Constitutional:      Appearance: Normal appearance.  HENT:     Head: Normocephalic.     Mouth/Throat:     Mouth: Mucous membranes are moist.     Pharynx: Oropharynx is clear.  Eyes:     Extraocular Movements: Extraocular movements intact.     Conjunctiva/sclera: Conjunctivae normal.     Pupils: Pupils are equal, round, and reactive to light.  Cardiovascular:     Rate and Rhythm: Normal rate and regular rhythm.     Pulses: Normal pulses.     Heart sounds: Normal heart sounds.  Pulmonary:     Effort: Pulmonary effort is normal.     Breath sounds: Normal breath sounds.  Musculoskeletal:     Cervical back: No tenderness.     Comments: Left hand: Positive swelling with bruising and tenderness, painful range of motion.  See picture below.  Lymphadenopathy:     Cervical: No cervical adenopathy.  Skin:    General: Skin is warm and dry.     Capillary Refill: Capillary refill takes less than 2 seconds.  Neurological:     General: No focal deficit present.     Mental Status: He is alert and oriented to person, place, and time.  Psychiatric:        Mood and Affect:  Mood normal.        Behavior: Behavior normal.   EKG: Normal sinus rhythm with a ventricular rate of 82/min.  No acute ischemic changes.  Normal EKG.    DG Hand Complete Left Result Date: 12/21/2023 CLINICAL DATA:  LEFT hand injury EXAM: LEFT HAND - COMPLETE 3+ VIEW COMPARISON:  None Available. FINDINGS: No evidence of fracture of the carpal or metacarpal bones. Radiocarpal joint is intact. Phalanges are normal. No soft tissue injury. IMPRESSION: No fracture or dislocation. Electronically Signed   By: Jackquline Boxer M.D.   On: 12/21/2023 15:33    ASSESSMENT & PLAN: A total of 44 minutes was spent with the patient and counseling/coordination of care regarding preparing for this visit, review of most recent office visit notes, review of chronic medical conditions under management, review of all medications, review of x-ray images done today, differential diagnosis of syncope, need for blood work today, prognosis, documentation and need for follow-up.  Problem List Items Addressed This Visit       Other   Syncope - Primary   Syncopal episode 2 weeks ago Most likely related to use of hydrocodone  Clinically stable today.  No red flag signs or symptoms. Normal neurological examination. Normal EKG. Recommend blood work today. ED precautions given Advised to follow-up with PCP within the next couple of weeks      Relevant Orders   Comprehensive metabolic panel with GFR   CBC with Differential/Platelet   Hand injury, left, initial encounter   Sustained during fall 2 weeks ago Suspected fracture.  Will do x-ray today and will review images when ready.  May need orthopedic referral.      Relevant Orders   DG Hand Complete Left (Completed)   Patient Instructions  Syncope, Adult  Syncope is when you pass out or faint for a short time. It is caused by  a sudden decrease in blood flow to the brain. This can happen for many reasons. It can sometimes happen when seeing blood, getting a  shot (injection), or having pain or strong emotions. Most causes of fainting are not dangerous, but in some cases it can be a sign of a serious medical problem. If you faint, get help right away. Call your local emergency services (911 in the [place] Follow these instructions at home: Watch for any changes in your symptoms. Take these actions to stay safe and help with your symptoms: Knowing when you may be about to faint Signs that you may be about to faint include: Feeling dizzy or light-headed. It may feel like the room is spinning. Feeling weak. Feeling like you may vomit (nauseous). Seeing spots or seeing all white or all black. Having cold, clammy skin. Feeling warm and sweaty. Hearing ringing in the ears. If you start to feel like you might faint, sit or lie down right away. If sitting, lower your head down between your legs. If lying down, raise (elevate) your feet above the level of your heart. Breathe deeply and steadily. Wait until all of the symptoms are gone. Have someone stay with you until you feel better. Medicines Take over-the-counter and prescription medicines only as told by your doctor. If you are taking blood pressure or heart medicine, sit up and stand up slowly. Spend a few minutes getting ready to sit and then stand. This can help you feel less dizzy. Lifestyle Do not drive, use machinery, or play sports until your doctor says it is okay. Do not drink alcohol. Do not smoke or use any products that contain nicotine  or tobacco. If you need help quitting, ask your doctor. Avoid hot tubs and saunas. General instructions Talk with your doctor about your symptoms. You may need to have testing to help find the cause. Drink enough fluid to keep your pee (urine) pale yellow. Avoid standing for a long time. If you must stand for a long time, do movements such as: Moving your legs. Crossing your legs. Flexing and stretching your leg muscles. Squatting. Keep all follow-up  visits. Contact a doctor if: You have episodes of near fainting. Get help right away if: You pass out or faint. You hit your head or are injured after fainting. You have any of these symptoms: Fast or uneven heartbeats (palpitations). Pain in your chest, belly, or back. Shortness of breath. You have jerky movements that you cannot control (seizure). You have a very bad headache. You are confused. You have problems with how you see (vision). You are very weak. You have trouble walking. You are bleeding from your mouth or your butt (rectum). You have black or tarry poop (stool). These symptoms may be an emergency. Get help right away. Call your local emergency services (911 in the [place] Do not wait to see if the symptoms will go away. Do not drive yourself to the hospital. Summary Syncope is when you pass out or faint for a short time. It is caused by a sudden decrease in blood flow to the brain. Signs that you may be about to faint include feeling dizzy or light-headed, feeling like you may vomit, seeing all white or all black, or having cold, clammy skin. If you start to feel like you might faint, sit or lie down right away. Lower your head if sitting, or raise (elevate) your feet if lying down. Breathe deeply and steadily. Wait until all of the symptoms are gone. This  information is not intended to replace advice given to you by your health care provider. Make sure you discuss any questions you have with your health care provider. Document Revised: 09/26/2020 Document Reviewed: 09/26/2020 Elsevier Patient Education  2024 Elsevier Inc.     Emil Schaumann, MD [facility]

## 2023-12-21 NOTE — Assessment & Plan Note (Signed)
 Syncopal episode 2 weeks ago Most likely related to use of hydrocodone  Clinically stable today.  No red flag signs or symptoms. Normal neurological examination. Normal EKG. Recommend blood work today. ED precautions given Advised to follow-up with PCP within the next couple of weeks

## 2023-12-21 NOTE — Patient Instructions (Signed)
Syncope, Adult  Syncope is when you pass out or faint for a short time. It is caused by a sudden decrease in blood flow to the brain. This can happen for many reasons. It can sometimes happen when seeing blood, getting a shot (injection), or having pain or strong emotions. Most causes of fainting are not dangerous, but in some cases it can be a sign of a serious medical problem. If you faint, get help right away. Call your local emergency services (911 in the [place] Follow these instructions at home: Watch for any changes in your symptoms. Take these actions to stay safe and help with your symptoms: Knowing when you may be about to faint Signs that you may be about to faint include: Feeling dizzy or light-headed. It may feel like the room is spinning. Feeling weak. Feeling like you may vomit (nauseous). Seeing spots or seeing all white or all black. Having cold, clammy skin. Feeling warm and sweaty. Hearing ringing in the ears. If you start to feel like you might faint, sit or lie down right away. If sitting, lower your head down between your legs. If lying down, raise (elevate) your feet above the level of your heart. Breathe deeply and steadily. Wait until all of the symptoms are gone. Have someone stay with you until you feel better. Medicines Take over-the-counter and prescription medicines only as told by your doctor. If you are taking blood pressure or heart medicine, sit up and stand up slowly. Spend a few minutes getting ready to sit and then stand. This can help you feel less dizzy. Lifestyle Do not drive, use machinery, or play sports until your doctor says it is okay. Do not drink alcohol. Do not smoke or use any products that contain nicotine or tobacco. If you need help quitting, ask your doctor. Avoid hot tubs and saunas. General instructions Talk with your doctor about your symptoms. You may need to have testing to help find the cause. Drink enough fluid to keep your pee  (urine) pale yellow. Avoid standing for a long time. If you must stand for a long time, do movements such as: Moving your legs. Crossing your legs. Flexing and stretching your leg muscles. Squatting. Keep all follow-up visits. Contact a doctor if: You have episodes of near fainting. Get help right away if: You pass out or faint. You hit your head or are injured after fainting. You have any of these symptoms: Fast or uneven heartbeats (palpitations). Pain in your chest, belly, or back. Shortness of breath. You have jerky movements that you cannot control (seizure). You have a very bad headache. You are confused. You have problems with how you see (vision). You are very weak. You have trouble walking. You are bleeding from your mouth or your butt (rectum). You have black or tarry poop (stool). These symptoms may be an emergency. Get help right away. Call your local emergency services (911 in the [place] Do not wait to see if the symptoms will go away. Do not drive yourself to the hospital. Summary Syncope is when you pass out or faint for a short time. It is caused by a sudden decrease in blood flow to the brain. Signs that you may be about to faint include feeling dizzy or light-headed, feeling like you may vomit, seeing all white or all black, or having cold, clammy skin. If you start to feel like you might faint, sit or lie down right away. Lower your head if sitting, or raise (elevate)   your feet if lying down. Breathe deeply and steadily. Wait until all of the symptoms are gone. This information is not intended to replace advice given to you by your health care provider. Make sure you discuss any questions you have with your health care provider. Document Revised: 09/26/2020 Document Reviewed: 09/26/2020 Elsevier Patient Education  2024 [employer].

## 2023-12-21 NOTE — Assessment & Plan Note (Signed)
 Sustained during fall 2 weeks ago Suspected fracture.  Will do x-ray today and will review images when ready.  May need orthopedic referral.

## 2023-12-22 ENCOUNTER — Ambulatory Visit (INDEPENDENT_AMBULATORY_CARE_PROVIDER_SITE_OTHER): Admitting: Physician Assistant

## 2023-12-22 ENCOUNTER — Encounter: Payer: Self-pay | Admitting: Physician Assistant

## 2023-12-22 DIAGNOSIS — M79642 Pain in left hand: Secondary | ICD-10-CM

## 2023-12-22 NOTE — Progress Notes (Signed)
 Office Visit Note   Patient: Nathan Hunt           Date of Birth: 04-10-1951           MRN: [id number] Visit Date: 12/22/2023              Requested by: Purcell Emil Schanz, MD [address] [place],  [place] [address] PCP: Joshua Debby CROME, MD   Assessment & Plan: Visit Diagnoses:  1. Pain in left hand     Plan: Impression  left hand second metacarpal pain and swelling following an injury as which occurred 2 weeks ago.  X-rays reviewed by me today show what appears to be a fracture through the metacarpal head.  I would like to place him in a radial gutter Velcro splint today.  Have also ordered a CT scan to further evaluate this as I do not want to immobilize him if there may not be a true fracture.  We will call him with the results.  Call with concerns or questions.  Follow-Up Instructions: Return in about 2 weeks (around 01/05/2024).   Orders:  Orders Placed This Encounter  Procedures   CT HAND LEFT WO CONTRAST   No orders of the defined types were placed in this encounter.     Procedures: No procedures performed   Clinical Data: No additional findings.   Subjective: Chief Complaint  Patient presents with   Left Hand - Pain    Index finger    HPI patient is a pleasant 73 year old right-hand-dominant gentleman who comes in today with left hand pain.  He notes a syncopal episode which occurred at home approximately 2 weeks ago after taking hydrocodone .  He thinks this is when he must of hit his left hand as the left hand second metacarpal has been swollen and painful since the fall.  He was seen by his PCP yesterday and then referred out for x-rays of the left hand which were unremarkable.  He is here today for further evaluation treat recommendation.  He is having a fair amount of pain to the left hand second metacarpal.  This appears to be worse with movements of the index finger.  He has not been taking anything for the pain.  He has not been wearing any  brace.  Review of Systems as detailed in HPI.  All others reviewed and are negative.   Objective: Vital Signs: There were no vitals taken for this visit.  Physical Exam well-developed well-nourished gentleman in no acute distress.  Alert and oriented x 3.  Ortho Exam left hand exam: Moderate tenderness and swelling to the second MCP joint.  Mild tenderness along the proximal phalanx.  He is able to make a full composite fist.  No pain with flexion of the PIP and DIP joints.  He does have mild pain with only slight limitation of flexion of the MCP joint.  No rotational deformity.  He is neurovascularly intact distally.  Specialty Comments:  No specialty comments available.  Imaging: DG Hand Complete Left Result Date: 12/21/2023 CLINICAL DATA:  LEFT hand injury EXAM: LEFT HAND - COMPLETE 3+ VIEW COMPARISON:  None Available. FINDINGS: No evidence of fracture of the carpal or metacarpal bones. Radiocarpal joint is intact. Phalanges are normal. No soft tissue injury. IMPRESSION: No fracture or dislocation. Electronically Signed   By: Jackquline Boxer M.D.   On: 12/21/2023 15:33   X-rays of the left hand reviewed by me show what appears to be a fracture through  the second metacarpal head.   PMFS History: Patient Active Problem List   Diagnosis Date Noted   Syncope 12/21/2023   Hand injury, left, initial encounter 12/21/2023   Malignant neoplasm of prostate (HCC) 06/22/2023   Need for prophylactic vaccination with combined diphtheria-tetanus-pertussis (DTP) vaccine 02/23/2023   Need for prophylactic vaccination and inoculation against varicella 02/23/2023   Chronic seborrheic dermatitis 02/23/2023   DDD (degenerative disc disease), cervical 02/23/2023   Flu vaccine need 02/23/2023   Agatston CAC score, >400 10/14/2022   Tremor of both hands 07/29/2022   Dyslipidemia, goal LDL below 100 07/29/2022   Screen for colon cancer 07/29/2022   Need for vaccination 07/29/2022   Neck pain,  chronic 07/29/2022   Stage 3a chronic kidney disease (HCC) 07/29/2022   Sleep disturbance 10/28/2021   Vitamin B deficiency 10/28/2021   Abnormal LFTs 10/28/2021   Mild protein-calorie malnutrition (HCC) 10/28/2021   Aortic atherosclerosis (HCC) 09/16/2020   Bipolar disorder with severe mania (HCC) 09/10/2019   Seasonal allergies 09/10/2019   Schizophrenia (HCC) 09/09/2019   Psychophysiological insomnia 09/06/2015   Allergic rhinitis due to pollen 03/25/2015   Tinnitus of both ears 09/19/2012   Depression 09/19/2012   BPH (benign prostatic hyperplasia) 09/19/2012   Past Medical History:  Diagnosis Date   Anxiety    Asthma    BPH (benign prostatic hyperplasia)    Cancer (HCC)    prostate cancer   Depression    Hyperlipidemia    Insomnia    Tendonitis    Tinnitus    Tinnitus    severe per pt    Family History  Problem Relation Age of Onset   Heart disease Mother    Heart disease Father    Heart disease Sister    Bone cancer Maternal Grandfather    Bone cancer Other    Alzheimer's disease Other    Colon cancer Neg Hx     Past Surgical History:  Procedure Laterality Date   RADIOACTIVE SEED IMPLANT N/A 08/02/2023   Procedure: INSERTION, RADIOACTIVE SEED;  Surgeon: Carolee Sherwood JONETTA Kamani, MD;  Location: [facility];  Service: Urology;  Laterality: N/A;  90 MINUTE CASE   SPACE OAR INSTILLATION N/A 08/02/2023   Procedure: INSERTION, BRACHYTHERAPY DEVICE, PROSTATE;  Surgeon: Carolee Sherwood JONETTA Kimani, MD;  Location: [facility];  Service: Urology;  Laterality: N/A;   TONSILLECTOMY  1962   Social History   Occupational History   Not on file  Tobacco Use   Smoking status: Former    Current packs/day: 0.00    Average packs/day: 1 pack/day for 24.0 years (24.0 ttl pk-yrs)    Types: Cigarettes    Start date: 18    Quit date: 2    Years since quitting: 32.5    Passive exposure: Past   Smokeless tobacco: Never  Vaping Use   Vaping status: Never Used  Substance and Sexual Activity    Alcohol use: Not Currently   Drug use: No   Sexual activity: Not Currently

## 2023-12-23 NOTE — Addendum Note (Signed)
 Addended by: GALA GALLEY on: 12/23/2023 08:31 AM   Modules accepted: Orders

## 2023-12-24 ENCOUNTER — Ambulatory Visit
Admission: RE | Admit: 2023-12-24 | Discharge: 2023-12-24 | Disposition: A | Discharge status: Home / Self Care | Source: Ambulatory Visit | Attending: Physician Assistant | Admitting: Physician Assistant

## 2023-12-24 DIAGNOSIS — M79642 Pain in left hand: Secondary | ICD-10-CM

## 2024-01-06 ENCOUNTER — Ambulatory Visit (INDEPENDENT_AMBULATORY_CARE_PROVIDER_SITE_OTHER): Admitting: Physician Assistant

## 2024-01-06 DIAGNOSIS — S62361D Nondisplaced fracture of neck of second metacarpal bone, left hand, subsequent encounter for fracture with routine healing: Secondary | ICD-10-CM | POA: Diagnosis not present

## 2024-01-06 NOTE — Progress Notes (Signed)
 Office Visit Note   Patient: Nathan Hunt           Date of Birth: 1951/01/06           MRN: [id number] Visit Date: 01/06/2024              Requested by: Joshua Debby CROME, MD [address] [place],  [place] [address] PCP: Joshua Debby CROME, MD   Assessment & Plan: Visit Diagnoses:  1. Closed nondisplaced fracture of neck of second metacarpal bone of left hand with routine healing, subsequent encounter     Plan: Impression is left hand second metacarpal neck fracture.  This should be amenable to nonoperative treatment.  I would like to keep him in his radial gutter Velcro splint for another few weeks.  He will follow-up in 2 weeks for repeat evaluation and three-view x-rays of the left hand.  Call with concerns or questions in the meantime.  Follow-Up Instructions: Return in about 2 weeks (around 01/20/2024).   Orders:  No orders of the defined types were placed in this encounter.  No orders of the defined types were placed in this encounter.     Procedures: No procedures performed   Clinical Data: No additional findings.   Subjective: Chief Complaint  Patient presents with   Left Hand - Follow-up    CT review    HPI patient is a pleasant 73 year old gentleman who comes in today to go over CT scan results of his left hand.  I saw him about 2 weeks ago which was 2 weeks after injuring his left hand.  At that point, there was a question of whether he had a fracture through the second metacarpal neck.  Based on his age, did not want to immobilize him if not needed.  However, elected to place him in a Velcro radial gutter splint.  He has been compliant wearing this for the past 2 weeks.  Subsequent CT scan of the left hand was obtained.  CT scan results show a mildly impacted second metacarpal neck fracture.  He tells me he has noticed improvement in symptoms since wearing the splint.  Review of Systems as detailed in HPI.  All others reviewed and are  negative.   Objective: Vital Signs: There were no vitals taken for this visit.  Physical Exam well-developed well-nourished gentleman in no acute distress.  Alert and oriented x 3.  Ortho Exam left hand exam: Ecchymosis throughout.  He does have moderate tenderness along the second metacarpal head and neck.  He is neurovascularly intact distally.  Specialty Comments:  No specialty comments available.  Imaging: CT HAND LEFT WO CONTRAST Result Date: 01/06/2024 CLINICAL DATA:  pain Left hand injury; pain.  Evaluate head of 2nd metacarpal. EXAM: CT OF THE LEFT HAND WITHOUT CONTRAST TECHNIQUE: Multidetector CT imaging of the left hand was performed according to the standard protocol. Multiplanar CT image reconstructions were also generated. RADIATION DOSE REDUCTION: This exam was performed according to the departmental dose-optimization program which includes automated exposure control, adjustment of the mA and/or kV according to patient size and/or use of iterative reconstruction technique. COMPARISON:  Radiographs 12/21/2023. FINDINGS: Interpretation of this examination was delayed as it was not made available for dictation in PACS until 01/06/2024. Bones/Joint/Cartilage The bones are demineralized. There is an acute appearing fracture of the 2nd metacarpal neck, most obvious on the coronal and sagittal reformatted images. This fracture demonstrates mild apex dorsal angulation and mild impaction. There is no involvement of the distal  articular surface. There is no dislocation or significant underlying arthropathy. A small effusion of the 2nd metacarpal phalangeal joint is noted. No other acute fractures or dislocations are identified. The joint spaces are preserved for age. Ligaments Suboptimally assessed by CT. Muscles and Tendons Unremarkable. As evaluated by CT, the hand tendons appear intact without significant tenosynovitis. Soft tissues No evidence of focal fluid collection, foreign body or soft  tissue emphysema. IMPRESSION: 1. Acute appearing mildly impacted and angulated fracture of the 2nd metacarpal neck. No involvement of the distal articular surface. 2. No other acute osseous findings. 3. No evidence of focal fluid collection, foreign body or soft tissue emphysema. Electronically Signed   By: Elsie Perone M.D.   On: 01/06/2024 09:37     PMFS History: Patient Active Problem List   Diagnosis Date Noted   Syncope 12/21/2023   Hand injury, left, initial encounter 12/21/2023   Malignant neoplasm of prostate (HCC) 06/22/2023   Need for prophylactic vaccination with combined diphtheria-tetanus-pertussis (DTP) vaccine 02/23/2023   Need for prophylactic vaccination and inoculation against varicella 02/23/2023   Chronic seborrheic dermatitis 02/23/2023   DDD (degenerative disc disease), cervical 02/23/2023   Flu vaccine need 02/23/2023   Agatston CAC score, >400 10/14/2022   Tremor of both hands 07/29/2022   Dyslipidemia, goal LDL below 100 07/29/2022   Screen for colon cancer 07/29/2022   Need for vaccination 07/29/2022   Neck pain, chronic 07/29/2022   Stage 3a chronic kidney disease (HCC) 07/29/2022   Sleep disturbance 10/28/2021   Vitamin B deficiency 10/28/2021   Abnormal LFTs 10/28/2021   Mild protein-calorie malnutrition (HCC) 10/28/2021   Aortic atherosclerosis (HCC) 09/16/2020   Bipolar disorder with severe mania (HCC) 09/10/2019   Seasonal allergies 09/10/2019   Schizophrenia (HCC) 09/09/2019   Psychophysiological insomnia 09/06/2015   Allergic rhinitis due to pollen 03/25/2015   Tinnitus of both ears 09/19/2012   Depression 09/19/2012   BPH (benign prostatic hyperplasia) 09/19/2012   Past Medical History:  Diagnosis Date   Anxiety    Asthma    BPH (benign prostatic hyperplasia)    Cancer (HCC)    prostate cancer   Depression    Hyperlipidemia    Insomnia    Tendonitis    Tinnitus    Tinnitus    severe per pt    Family History  Problem Relation  Age of Onset   Heart disease Mother    Heart disease Father    Heart disease Sister    Bone cancer Maternal Grandfather    Bone cancer Other    Alzheimer's disease Other    Colon cancer Neg Hx     Past Surgical History:  Procedure Laterality Date   RADIOACTIVE SEED IMPLANT N/A 08/02/2023   Procedure: INSERTION, RADIOACTIVE SEED;  Surgeon: Carolee Sherwood JONETTA Mena, MD;  Location: [facility];  Service: Urology;  Laterality: N/A;  90 MINUTE CASE   SPACE OAR INSTILLATION N/A 08/02/2023   Procedure: INSERTION, BRACHYTHERAPY DEVICE, PROSTATE;  Surgeon: Carolee Sherwood JONETTA Broxton, MD;  Location: [facility];  Service: Urology;  Laterality: N/A;   TONSILLECTOMY  1962   Social History   Occupational History   Not on file  Tobacco Use   Smoking status: Former    Current packs/day: 0.00    Average packs/day: 1 pack/day for 24.0 years (24.0 ttl pk-yrs)    Types: Cigarettes    Start date: 22    Quit date: 4    Years since quitting: 32.6    Passive exposure: Past  Smokeless tobacco: Never  Vaping Use   Vaping status: Never Used  Substance and Sexual Activity   Alcohol use: Not Currently   Drug use: No   Sexual activity: Not Currently

## 2024-01-11 ENCOUNTER — Other Ambulatory Visit: Payer: Self-pay | Admitting: Internal Medicine

## 2024-01-11 DIAGNOSIS — F32A Depression, unspecified: Secondary | ICD-10-CM

## 2024-01-11 DIAGNOSIS — F322 Major depressive disorder, single episode, severe without psychotic features: Secondary | ICD-10-CM

## 2024-01-13 ENCOUNTER — Encounter: Payer: Self-pay | Admitting: Internal Medicine

## 2024-01-13 ENCOUNTER — Ambulatory Visit (INDEPENDENT_AMBULATORY_CARE_PROVIDER_SITE_OTHER): Admitting: Internal Medicine

## 2024-01-13 VITALS — BP 106/76 | HR 96 | Temp 98.1°F | Resp 16 | Ht 69.0 in | Wt 161.6 lb

## 2024-01-13 DIAGNOSIS — E539 Vitamin B deficiency, unspecified: Secondary | ICD-10-CM

## 2024-01-13 DIAGNOSIS — E441 Mild protein-calorie malnutrition: Secondary | ICD-10-CM

## 2024-01-13 DIAGNOSIS — I951 Orthostatic hypotension: Secondary | ICD-10-CM | POA: Insufficient documentation

## 2024-01-13 DIAGNOSIS — M5030 Other cervical disc degeneration, unspecified cervical region: Secondary | ICD-10-CM

## 2024-01-13 DIAGNOSIS — E785 Hyperlipidemia, unspecified: Secondary | ICD-10-CM

## 2024-01-13 DIAGNOSIS — C61 Malignant neoplasm of prostate: Secondary | ICD-10-CM | POA: Diagnosis not present

## 2024-01-13 DIAGNOSIS — E038 Other specified hypothyroidism: Secondary | ICD-10-CM

## 2024-01-13 LAB — LIPID PANEL
Cholesterol: 129 mg/dL (ref 0–200)
HDL: 35.3 mg/dL — ABNORMAL LOW (ref >39.00)
LDL Cholesterol: 71 mg/dL (ref 0–99)
NonHDL: 93.53
Total CHOL/HDL Ratio: 4
Triglycerides: 112 mg/dL (ref 0.0–149.0)
VLDL: 22.4 mg/dL (ref 0.0–40.0)

## 2024-01-13 LAB — CORTISOL: Cortisol, Plasma: 33.1 ug/dL

## 2024-01-13 LAB — TSH: TSH: 5.95 u[IU]/mL — ABNORMAL HIGH (ref 0.35–5.50)

## 2024-01-13 LAB — FOLATE: Folate: 13.5 ng/mL (ref >5.9)

## 2024-01-13 LAB — VITAMIN B12: Vitamin B-12: 318 pg/mL (ref 211–911)

## 2024-01-13 NOTE — Patient Instructions (Signed)
Syncope, Adult  Syncope refers to a condition in which a person temporarily loses consciousness. Syncope may also be called fainting or passing out. It is caused by a sudden decrease in blood flow to the brain. This can happen for a variety of reasons. Most causes of syncope are not dangerous. It can be triggered by things such as needle sticks, seeing blood, pain, or intense emotion. However, syncope can also be a sign of a serious medical problem, such as a heart abnormality. Other causes can include dehydration, migraines, or taking medicines that lower blood pressure. Your health care provider may do tests to find the reason why you are having syncope. If you faint, get medical help right away. Call your local emergency services (911 in the [place] Follow these instructions at home: Pay attention to any changes in your symptoms. Take these actions to stay safe and to help relieve your symptoms: Knowing when you may be about to faint Signs that you may be about to faint include: Feeling dizzy, weak, light-headed, or like the room is spinning. Feeling nauseous. Seeing spots or seeing all white or all black in your field of vision. Having cold, clammy skin or feeling warm and sweaty. Hearing ringing in the ears (tinnitus). If you start to feel like you might faint, sit or lie down right away. If sitting, put your head down between your legs. If lying down, raise (elevate) your feet above the level of your heart. Breathe deeply and steadily. Wait until all the symptoms have passed. Have someone stay with you until you feel stable. Medicines Take over-the-counter and prescription medicines only as told by your health care provider. If you are taking blood pressure or heart medicine, get up slowly and take several minutes to sit and then stand. This can reduce dizziness and decrease the risk of syncope. Lifestyle Do not drive, use machinery, or play sports until your health care provider says it is  okay. Do not drink alcohol. Do not use any products that contain nicotine or tobacco. These products include cigarettes, chewing tobacco, and vaping devices, such as e-cigarettes. If you need help quitting, ask your health care provider. Avoid hot tubs and saunas. General instructions Talk with your health care provider about your symptoms. You may need to have testing to understand the cause of your syncope. Drink enough fluid to keep your urine pale yellow. Avoid prolonged standing. If you must stand for a long time, do movements such as: Moving your legs. Crossing your legs. Flexing and stretching your leg muscles. Squatting. Keep all follow-up visits. This is important. Contact a health care provider if: You have episodes of near fainting. Get help right away if: You faint. You hit your head or are injured after fainting. You have any of these symptoms that may indicate trouble with your heart: Fast or irregular heartbeats (palpitations). Unusual pain in your chest, abdomen, or back. Shortness of breath. You have a seizure. You have a severe headache. You are confused. You have vision problems. You have severe weakness or trouble walking. You are bleeding from your mouth or rectum, or you have black or tarry stool. These symptoms may represent a serious problem that is an emergency. Do not wait to see if your symptoms will go away. Get medical help right away. Call your local emergency services (911 in the [place] Do not drive yourself to the hospital. Summary Syncope refers to a condition in which a person temporarily loses consciousness. Syncope may also be called   fainting or passing out. It is caused by a sudden decrease in blood flow to the brain. Signs that you may be about to faint include dizziness, feeling light-headed, feeling nauseous, sudden vision changes, or cold, clammy skin. Even though most causes of syncope are not dangerous, syncope can be a sign of a serious  medical problem. Get help right away if you faint. If you start to feel like you might faint, sit or lie down right away. If sitting, put your head down between your legs. If lying down, raise (elevate) your feet above the level of your heart. This information is not intended to replace advice given to you by your health care provider. Make sure you discuss any questions you have with your health care provider. Document Revised: 09/26/2020 Document Reviewed: 09/26/2020 Elsevier Patient Education  2024 [employer]  

## 2024-01-13 NOTE — Progress Notes (Signed)
 "  Subjective:  Patient ID: Nathan Hunt, male    DOB: April 04, 1951  Age: 73 y.o. MRN: [id number]  CC: Near Syncope (Patient states this happens about once every 2-3 months. He also hasn't eaten anything today and his blood sugar maybe low. ), Neck Pain (5 pain scale ), Back Pain, and Tremors (Patient states this has been going on for about a year or 2 and right now is as bad as it gets. )   HPI [name] presents for f/up ----  Discussed the use of AI scribe software for clinical note transcription with the patient, who gave verbal consent to proceed.  History of Present Illness Nathan Hunt is a 73 year old male who presents with neck pain and episodes of fainting.  He experiences neck and upper back pain without radiation to the arms or legs. The pain is currently present and has been evaluated with an MRI in the past; the patient recalls that the MRI indicated something, possibly rheumatism or bursitis, but he is uncertain of the exact diagnosis.  He has a history of fainting episodes that began three years ago, occurring approximately once every two to three months over the past year. A recent incident about five or six weeks ago resulted in a fall and a broken hand. He has undergone an EKG recently as part of the evaluation for these episodes. He feels faint currently and attributes this to low blood pressure due to not eating, as he has not consumed food or water  since the previous night.  He experiences an intermittent tremor over the past two to three years, with an unknown cause. He is unsure if it is related to his use of Risperdal  or if he has seen a neurologist for this issue. He occasionally feels off balance when walking.  He has a history of prostate cancer for which he underwent surgery and believes he is now cancer-free.  No pain radiating towards his arms or legs.    Outpatient Medications Prior to Visit  Medication Sig Dispense Refill   aspirin  EC 81 MG  tablet Take 1 tablet (81 mg total) by mouth daily. Swallow whole. 30 tablet 3   risperiDONE  (RISPERDAL ) 1 MG tablet Take 1 tablet (1 mg total) by mouth at bedtime. 90 tablet 1   rosuvastatin  (CRESTOR ) 40 MG tablet Take 1 tablet (40 mg total) by mouth daily. 90 tablet 3   sertraline  (ZOLOFT ) 100 MG tablet Take 1.5 tablets (150 mg total) by mouth every morning. 135 tablet 1   zolpidem  (AMBIEN ) 5 MG tablet Take 1 tablet (5 mg total) by mouth at bedtime as needed for sleep. 90 tablet 1   No facility-administered medications prior to visit.    ROS Review of Systems  Constitutional:  Negative for appetite change, chills, diaphoresis, fatigue and fever.  HENT: Negative.    Eyes: Negative.   Respiratory: Negative.  Negative for chest tightness, shortness of breath and wheezing.   Cardiovascular:  Negative for chest pain, palpitations and leg swelling.  Gastrointestinal:  Negative for abdominal pain, constipation, diarrhea, nausea and vomiting.  Genitourinary: Negative.  Negative for difficulty urinating.  Musculoskeletal: Negative.   Skin: Negative.   Neurological:  Positive for dizziness, tremors, syncope and light-headedness. Negative for seizures, speech difficulty and weakness.  Hematological:  Negative for adenopathy. Does not bruise/bleed easily.  Psychiatric/Behavioral:  Positive for confusion and decreased concentration. Negative for agitation, behavioral problems, dysphoric mood, sleep disturbance and suicidal ideas. The patient is nervous/anxious.  Objective:  BP 106/76 (BP Location: Left Arm, Patient Position: Sitting, Cuff Size: Small)   Pulse 96   Temp 98.1 F (36.7 C) (Oral)   Resp 16   Ht 5' 9 (1.753 m)   Wt 161 lb 9.6 oz (73.3 kg)   SpO2 99%   BMI 23.86 kg/m   BP Readings from Last 3 Encounters:  01/13/24 106/76  12/21/23 100/88  08/25/23 92/76    Wt Readings from Last 3 Encounters:  01/13/24 161 lb 9.6 oz (73.3 kg)  12/21/23 163 lb (73.9 kg)  12/06/23 161 lb  (73 kg)    Physical Exam Vitals reviewed.  Constitutional:      General: He is not in acute distress.    Appearance: He is ill-appearing. He is not toxic-appearing or diaphoretic.  HENT:     Nose: Nose normal.     Mouth/Throat:     Mouth: Mucous membranes are moist.  Eyes:     General: No scleral icterus.    Conjunctiva/sclera: Conjunctivae normal.  Cardiovascular:     Rate and Rhythm: Normal rate and regular rhythm.     Heart sounds: No murmur heard.    No friction rub. No gallop.     Comments: He refused an EKG today Pulmonary:     Effort: Pulmonary effort is normal.     Breath sounds: No stridor. No wheezing, rhonchi or rales.  Abdominal:     General: Abdomen is flat.     Palpations: There is no mass.     Tenderness: There is no abdominal tenderness. There is no guarding.     Hernia: No hernia is present.  Musculoskeletal:     Cervical back: Neck supple.     Right lower leg: No edema.     Left lower leg: No edema.  Lymphadenopathy:     Cervical: No cervical adenopathy.  Skin:    General: Skin is warm and dry.  Neurological:     General: No focal deficit present.     Mental Status: He is alert.  Psychiatric:        Attention and Perception: He is inattentive.        Mood and Affect: Affect is flat. Affect is not tearful.        Behavior: Behavior is slowed and withdrawn. Behavior is not agitated, aggressive, hyperactive or combative. Behavior is cooperative.        Thought Content: Thought content normal. Thought content is not paranoid or delusional. Thought content does not include homicidal or suicidal ideation.        Cognition and Memory: Cognition is impaired. Memory is impaired.     Lab Results  Component Value Date   WBC 8.3 12/21/2023   HGB 14.3 12/21/2023   HCT 43.0 12/21/2023   PLT 234.0 12/21/2023   GLUCOSE 111 (H) 12/21/2023   CHOL 129 01/13/2024   TRIG 112.0 01/13/2024   HDL 35.30 (L) 01/13/2024   LDLCALC 71 01/13/2024   ALT 37 12/21/2023    AST 30 12/21/2023   NA 140 12/21/2023   K 4.1 12/21/2023   CL 106 12/21/2023   CREATININE 1.20 12/21/2023   BUN 23 12/21/2023   CO2 26 12/21/2023   TSH 5.95 (H) 01/13/2024   PSA 0.6 11/16/2023   HGBA1C 5.2 10/28/2021    No results found.  Assessment & Plan:  DDD (degenerative disc disease), cervical  Vitamin B deficiency -     Vitamin B12; Future -     Methylmalonic acid, serum;  Future -     Folate; Future  Mild protein-calorie malnutrition (HCC) -     Vitamin B12; Future -     Methylmalonic acid, serum; Future -     Folate; Future  Malignant neoplasm of prostate (HCC)  Dyslipidemia, goal LDL below 100 -     TSH; Future -     Lipid panel; Future  Orthostatic hypotension- Labs are negative for secondary causes. -     TSH; Future -     Cortisol; Future  Subclinical hypothyroidism     Follow-up: Return if symptoms worsen or fail to improve.  Debby Molt, MD "

## 2024-01-14 DIAGNOSIS — E038 Other specified hypothyroidism: Secondary | ICD-10-CM | POA: Insufficient documentation

## 2024-01-18 ENCOUNTER — Ambulatory Visit: Payer: Self-pay | Admitting: Internal Medicine

## 2024-01-18 LAB — METHYLMALONIC ACID, SERUM: Methylmalonic Acid, Quant: 133 nmol/L (ref 69–390)

## 2024-01-21 ENCOUNTER — Other Ambulatory Visit: Payer: Self-pay | Admitting: Internal Medicine

## 2024-01-21 DIAGNOSIS — I951 Orthostatic hypotension: Secondary | ICD-10-CM

## 2024-01-21 MED ORDER — FLUDROCORTISONE ACETATE 0.1 MG PO TABS: 0.1000 mg | ORAL_TABLET | Freq: Every day | ORAL | 0 refills | Status: AC

## 2024-01-25 ENCOUNTER — Other Ambulatory Visit (INDEPENDENT_AMBULATORY_CARE_PROVIDER_SITE_OTHER): Payer: Self-pay

## 2024-01-25 ENCOUNTER — Ambulatory Visit (INDEPENDENT_AMBULATORY_CARE_PROVIDER_SITE_OTHER): Admitting: Physician Assistant

## 2024-01-25 DIAGNOSIS — S62361D Nondisplaced fracture of neck of second metacarpal bone, left hand, subsequent encounter for fracture with routine healing: Secondary | ICD-10-CM

## 2024-01-25 NOTE — Progress Notes (Signed)
 Office Visit Note   Patient: Nathan Hunt           Date of Birth: Mar 16, 1951           MRN: [id number] Visit Date: 01/25/2024              Requested by: Joshua Debby CROME, MD [address] [place],  [place] [address] PCP: Joshua Debby CROME, MD   Assessment & Plan: Visit Diagnoses:  1. Closed nondisplaced fracture of neck of second metacarpal bone of left hand with routine healing, subsequent encounter     Plan: Impression is 5 weeks status post left hand second metacarpal neck fracture.  At this point, do not see a lot of consolidation on x-ray.  I would like to keep him in his radial gutter splint for another 3 weeks.  He will follow-up in 3 weeks for repeat evaluation and x-rays as well as initiation of OT.  Call with concerns or questions in the meantime.  Follow-Up Instructions: Return in about 3 months (around 04/26/2024).   Orders:  Orders Placed This Encounter  Procedures   XR Hand Complete Left   No orders of the defined types were placed in this encounter.     Procedures: No procedures performed   Clinical Data: No additional findings.   Subjective: Chief Complaint  Patient presents with   Left Hand - Pain    HPI Patient is a pleasant 73 year old gentleman who comes in today approximately 5 weeks status post left hand second metacarpal neck fracture.  He has been compliant wearing his radial gutter splint.  He still has some pain but overall this has improved.  He has been taking NSAIDs with relief.  Review of Systems as detailed in HPI.  All others reviewed and are negative.   Objective: Vital Signs: There were no vitals taken for this visit.  Physical Exam well-developed well-nourished gentleman no acute distress.  Alert and oriented x 3.  Ortho Exam left hand exam: Minimal swelling.  No ecchymosis.  Painless range of motion.  Does have tenderness to the metacarpal neck.  He does appear to have some ulnar deviation.  He is neurovascularly intact  distally.  Specialty Comments:  No specialty comments available.  Imaging: XR Hand Complete Left Result Date: 01/25/2024 X-rays demonstrate lucency to the metacarpal neck.    PMFS History: Patient Active Problem List   Diagnosis Date Noted   Subclinical hypothyroidism 01/14/2024   Orthostatic hypotension 01/13/2024   Malignant neoplasm of prostate (HCC) 06/22/2023   Need for prophylactic vaccination with combined diphtheria-tetanus-pertussis (DTP) vaccine 02/23/2023   Need for prophylactic vaccination and inoculation against varicella 02/23/2023   Chronic seborrheic dermatitis 02/23/2023   DDD (degenerative disc disease), cervical 02/23/2023   Flu vaccine need 02/23/2023   Agatston CAC score, >400 10/14/2022   Tremor of both hands 07/29/2022   Dyslipidemia, goal LDL below 100 07/29/2022   Screen for colon cancer 07/29/2022   Need for vaccination 07/29/2022   Neck pain, chronic 07/29/2022   Stage 3a chronic kidney disease (HCC) 07/29/2022   Sleep disturbance 10/28/2021   Vitamin B deficiency 10/28/2021   Mild protein-calorie malnutrition (HCC) 10/28/2021   Aortic atherosclerosis (HCC) 09/16/2020   Bipolar disorder with severe mania (HCC) 09/10/2019   Seasonal allergies 09/10/2019   Schizophrenia (HCC) 09/09/2019   Psychophysiological insomnia 09/06/2015   Allergic rhinitis due to pollen 03/25/2015   Tinnitus of both ears 09/19/2012   BPH (benign prostatic hyperplasia) 09/19/2012   Past Medical History:  Diagnosis Date   Anxiety    Asthma    BPH (benign prostatic hyperplasia)    Cancer (HCC)    prostate cancer   Depression    Hyperlipidemia    Insomnia    Tendonitis    Tinnitus    Tinnitus    severe per pt    Family History  Problem Relation Age of Onset   Heart disease Mother    Heart disease Father    Heart disease Sister    Bone cancer Maternal Grandfather    Bone cancer Other    Alzheimer's disease Other    Colon cancer Neg Hx     Past Surgical  History:  Procedure Laterality Date   RADIOACTIVE SEED IMPLANT N/A 08/02/2023   Procedure: INSERTION, RADIOACTIVE SEED;  Surgeon: Carolee Sherwood JONETTA Zebulan, MD;  Location: [facility];  Service: Urology;  Laterality: N/A;  90 MINUTE CASE   SPACE OAR INSTILLATION N/A 08/02/2023   Procedure: INSERTION, BRACHYTHERAPY DEVICE, PROSTATE;  Surgeon: Carolee Sherwood JONETTA Volney, MD;  Location: [facility];  Service: Urology;  Laterality: N/A;   TONSILLECTOMY  1962   Social History   Occupational History   Not on file  Tobacco Use   Smoking status: Former    Current packs/day: 0.00    Average packs/day: 1 pack/day for 24.0 years (24.0 ttl pk-yrs)    Types: Cigarettes    Start date: 64    Quit date: 35    Years since quitting: 32.6    Passive exposure: Past   Smokeless tobacco: Never  Vaping Use   Vaping status: Never Used  Substance and Sexual Activity   Alcohol use: Not Currently   Drug use: No   Sexual activity: Not Currently

## 2024-02-15 ENCOUNTER — Other Ambulatory Visit (INDEPENDENT_AMBULATORY_CARE_PROVIDER_SITE_OTHER): Payer: Self-pay

## 2024-02-15 ENCOUNTER — Encounter: Payer: Self-pay | Admitting: Orthopaedic Surgery

## 2024-02-15 ENCOUNTER — Ambulatory Visit (INDEPENDENT_AMBULATORY_CARE_PROVIDER_SITE_OTHER): Admitting: Orthopaedic Surgery

## 2024-02-15 DIAGNOSIS — S62361D Nondisplaced fracture of neck of second metacarpal bone, left hand, subsequent encounter for fracture with routine healing: Secondary | ICD-10-CM | POA: Diagnosis not present

## 2024-02-15 NOTE — Progress Notes (Signed)
 Office Visit Note   Patient: Nathan Hunt           Date of Birth: [birth year]           MRN: [id number] Visit Date: 02/15/2024              Requested by: Joshua Debby CROME, MD [address] [place],  [place] [address] PCP: Joshua Debby CROME, MD   Assessment & Plan: Visit Diagnoses:  1. Closed nondisplaced fracture of neck of second metacarpal bone of left hand with routine healing, subsequent encounter     Plan: History of Present Illness Nathan Hunt is a 73 year old male who presents for follow-up of a hand fracture.  The hand fracture occurred in early July, with a delay in seeking medical attention for a couple of weeks. Pain has improved significantly, currently rated at 1 to 2 out of 10. Sharp pains that were persistent have resolved, but sharp pain remains between two knuckles, possibly due to prolonged immobilization. He is concerned about the hand's appearance, describing it as 'crooked'.  Physical Exam MUSCULOSKELETAL: Mild stiffness in the right hand.  Good arc of motion of index finger.  Results RADIOLOGY Hand X-ray: Fracture fully healed.  Assessment and Plan Healed fracture of neck of second metacarpal bone, left hand Fracture fully healed with slight malalignment. Minimal pain and stiffness noted. No functional impairment. - Remove brace, increase hand usage. - Advised to adjust activity based on symptoms.  Follow-Up Instructions: No follow-ups on file.   Orders:  Orders Placed This Encounter  Procedures   XR Hand Complete Left   No orders of the defined types were placed in this encounter.     Procedures: No procedures performed   Clinical Data: No additional findings.   Subjective: Chief Complaint  Patient presents with   Left Hand - Follow-up    2nd metacarpal fracture    HPI  Review of Systems  Constitutional: Negative.   HENT: Negative.    Eyes: Negative.   Respiratory: Negative.    Cardiovascular: Negative.    Gastrointestinal: Negative.   Endocrine: Negative.   Genitourinary: Negative.   Skin: Negative.   Allergic/Immunologic: Negative.   Neurological: Negative.   Hematological: Negative.   Psychiatric/Behavioral: Negative.    All other systems reviewed and are negative.    Objective: Vital Signs: There were no vitals taken for this visit.  Physical Exam Vitals and nursing note reviewed.  Constitutional:      Appearance: He is well-developed.  HENT:     Head: Normocephalic and atraumatic.  Eyes:     Pupils: Pupils are equal, round, and reactive to light.  Pulmonary:     Effort: Pulmonary effort is normal.  Abdominal:     Palpations: Abdomen is soft.  Musculoskeletal:        General: Normal range of motion.     Cervical back: Neck supple.  Skin:    General: Skin is warm.  Neurological:     Mental Status: He is alert and oriented to person, place, and time.  Psychiatric:        Behavior: Behavior normal.        Thought Content: Thought content normal.        Judgment: Judgment normal.     Ortho Exam  Specialty Comments:  No specialty comments available.  Imaging: No results found.   PMFS History: Patient Active Problem List   Diagnosis Date Noted   Subclinical hypothyroidism 01/14/2024   Orthostatic  hypotension 01/13/2024   Malignant neoplasm of prostate (HCC) 06/22/2023   Need for prophylactic vaccination with combined diphtheria-tetanus-pertussis (DTP) vaccine 02/23/2023   Need for prophylactic vaccination and inoculation against varicella 02/23/2023   Chronic seborrheic dermatitis 02/23/2023   DDD (degenerative disc disease), cervical 02/23/2023   Flu vaccine need 02/23/2023   Agatston CAC score, >400 10/14/2022   Tremor of both hands 07/29/2022   Dyslipidemia, goal LDL below 100 07/29/2022   Screen for colon cancer 07/29/2022   Need for vaccination 07/29/2022   Neck pain, chronic 07/29/2022   Stage 3a chronic kidney disease (HCC) 07/29/2022   Sleep  disturbance 10/28/2021   Vitamin B deficiency 10/28/2021   Mild protein-calorie malnutrition (HCC) 10/28/2021   Aortic atherosclerosis (HCC) 09/16/2020   Bipolar disorder with severe mania (HCC) 09/10/2019   Seasonal allergies 09/10/2019   Schizophrenia (HCC) 09/09/2019   Psychophysiological insomnia 09/06/2015   Allergic rhinitis due to pollen 03/25/2015   Tinnitus of both ears 09/19/2012   BPH (benign prostatic hyperplasia) 09/19/2012   Past Medical History:  Diagnosis Date   Anxiety    Asthma    BPH (benign prostatic hyperplasia)    Cancer (HCC)    prostate cancer   Depression    Hyperlipidemia    Insomnia    Tendonitis    Tinnitus    Tinnitus    severe per pt    Family History  Problem Relation Age of Onset   Heart disease Mother    Heart disease Father    Heart disease Sister    Bone cancer Maternal Grandfather    Bone cancer Other    Alzheimer's disease Other    Colon cancer Neg Hx     Past Surgical History:  Procedure Laterality Date   RADIOACTIVE SEED IMPLANT N/A 08/02/2023   Procedure: INSERTION, RADIOACTIVE SEED;  Surgeon: Carolee Sherwood JONETTA Rhyder, MD;  Location: [facility];  Service: Urology;  Laterality: N/A;  90 MINUTE CASE   SPACE OAR INSTILLATION N/A 08/02/2023   Procedure: INSERTION, BRACHYTHERAPY DEVICE, PROSTATE;  Surgeon: Carolee Sherwood JONETTA Hansel, MD;  Location: [facility];  Service: Urology;  Laterality: N/A;   TONSILLECTOMY  1962   Social History   Occupational History   Not on file  Tobacco Use   Smoking status: Former    Current packs/day: 0.00    Average packs/day: 1 pack/day for 24.0 years (24.0 ttl pk-yrs)    Types: Cigarettes    Start date: 37    Quit date: 15    Years since quitting: 32.7    Passive exposure: Past   Smokeless tobacco: Never  Vaping Use   Vaping status: Never Used  Substance and Sexual Activity   Alcohol use: Not Currently   Drug use: No   Sexual activity: Not Currently

## 2024-04-03 ENCOUNTER — Encounter: Payer: Self-pay | Admitting: Radiology

## 2024-06-23 ENCOUNTER — Encounter: Payer: Self-pay | Admitting: *Deleted

## 2024-06-23 ENCOUNTER — Ambulatory Visit

## 2024-06-23 VITALS — BP 92/70 | HR 93 | Ht 69.0 in | Wt 170.0 lb

## 2024-06-23 DIAGNOSIS — Z23 Encounter for immunization: Secondary | ICD-10-CM

## 2024-06-23 DIAGNOSIS — Z0000 Encounter for general adult medical examination without abnormal findings: Secondary | ICD-10-CM | POA: Diagnosis not present

## 2024-06-23 NOTE — Progress Notes (Signed)
 "  Chief Complaint  Patient presents with   Medicare Wellness     Subjective:   Nathan Hunt is a 74 y.o. male who presents for a Medicare Annual Wellness Visit.  Visit info / Clinical Intake: Medicare Wellness Visit Type:: Subsequent Annual Wellness Visit Persons participating in visit and providing information:: patient Medicare Wellness Visit Mode:: In-person (required for WTM) Interpreter Needed?: No Pre-visit prep was completed: yes AWV questionnaire completed by patient prior to visit?: no Living arrangements:: (!) lives alone Patient's Overall Health Status Rating: (!) fair Typical amount of pain: some (neck pain) Does pain affect daily life?: (!) yes (standing more than 20 mins) Are you currently prescribed opioids?: no  Dietary Habits and Nutritional Risks How many meals a day?: 2 (2/3 per pt) Eats fruit and vegetables daily?: yes Most meals are obtained by: preparing own meals; eating out (50/50) In the last 2 weeks, have you had any of the following?: none Diabetic:: no  Functional Status Activities of Daily Living (to include ambulation/medication): Independent Ambulation: Independent Medication Administration: Independent Home Management (perform basic housework or laundry): Independent Manage your own finances?: yes Primary transportation is: driving Concerns about vision?: no *vision screening is required for WTM* Concerns about hearing?: (!) yes (Tinnitus of both ears) Uses hearing aids?: no Hear whispered voice?: yes  Fall Screening Falls in the past year?: 1 Number of falls in past year: 1 Was there an injury with Fall?: 1 (lt hand injury) Fall Risk Category Calculator: 3 Patient Fall Risk Level: High Fall Risk  Fall Risk Patient at Risk for Falls Due to: Impaired balance/gait Fall risk Follow up: Falls evaluation completed; Falls prevention discussed  Home and Transportation Safety: All rugs have non-skid backing?: N/A, no rugs All stairs  or steps have railings?: yes (5-6 steps at front door) Grab bars in the bathtub or shower?: yes Have non-skid surface in bathtub or shower?: (!) no Good home lighting?: yes Regular seat belt use?: yes Hospital stays in the last year:: no  Cognitive Assessment Difficulty concentrating, remembering, or making decisions? : yes (has memory problems-per pt) Will 6CIT or Mini Cog be Completed: yes What year is it?: 0 points What month is it?: 0 points Give patient an address phrase to remember (5 components): [address], [place], [place] About what time is it?: 0 points Count backwards from 20 to 1: 0 points  Advance Directives (For Healthcare) Does Patient Have a Medical Advance Directive?: No Type of Advance Directive: Living will Would patient like information on creating a medical advance directive?: No - Patient declined    Allergies (verified) Patient has no allergy information on record.   Current Medications (verified) Outpatient Encounter Medications as of 06/23/2024  Medication Sig   aspirin  EC 81 MG tablet Take 1 tablet (81 mg total) by mouth daily. Swallow whole.   fludrocortisone  (FLORINEF ) 0.1 MG tablet Take 1 tablet (0.1 mg total) by mouth daily.   risperiDONE  (RISPERDAL ) 1 MG tablet Take 1 tablet (1 mg total) by mouth at bedtime.   rosuvastatin  (CRESTOR ) 40 MG tablet Take 1 tablet (40 mg total) by mouth daily.   sertraline  (ZOLOFT ) 100 MG tablet Take 1.5 tablets (150 mg total) by mouth every morning.   zolpidem  (AMBIEN ) 5 MG tablet Take 1 tablet (5 mg total) by mouth at bedtime as needed for sleep.   No facility-administered encounter medications on file as of 06/23/2024.    History: Past Medical History:  Diagnosis Date   Anxiety  Asthma    BPH (benign prostatic hyperplasia)    Cancer (HCC)    prostate cancer   Depression    Hyperlipidemia    Insomnia    Tendonitis    Tinnitus    Tinnitus    severe per pt   Past Surgical History:  Procedure Laterality  Date   RADIOACTIVE SEED IMPLANT N/A 08/02/2023   Procedure: INSERTION, RADIOACTIVE SEED;  Surgeon: Carolee Sherwood JONETTA Zaccheus, MD;  Location: [facility];  Service: Urology;  Laterality: N/A;  90 MINUTE CASE   SPACE OAR INSTILLATION N/A 08/02/2023   Procedure: INSERTION, BRACHYTHERAPY DEVICE, PROSTATE;  Surgeon: Carolee Sherwood JONETTA Jager, MD;  Location: [facility];  Service: Urology;  Laterality: N/A;   TONSILLECTOMY  1962   Family History  Problem Relation Age of Onset   Heart disease Mother    Heart disease Father    Heart disease Sister    Bone cancer Maternal Grandfather    Bone cancer Other    Alzheimer's disease Other    Colon cancer Neg Hx    Social History   Occupational History   Occupation: Retired  Tobacco Use   Smoking status: Former    Current packs/day: 0.00    Average packs/day: 1 pack/day for 24.0 years (24.0 ttl pk-yrs)    Types: Cigarettes    Start date: 58    Quit date: 1993    Years since quitting: 33.0    Passive exposure: Past   Smokeless tobacco: Never  Vaping Use   Vaping status: Never Used  Substance and Sexual Activity   Alcohol use: Not Currently   Drug use: No   Sexual activity: Not Currently   Tobacco Counseling Counseling given: Not Answered  SDOH Screenings   Food Insecurity: No Food Insecurity (06/23/2024)  Housing: Unknown (06/23/2024)  Transportation Needs: No Transportation Needs (06/23/2024)  Utilities: Not At Risk (06/23/2024)  Alcohol Screen: Low Risk (06/22/2023)  Depression (PHQ2-9): Low Risk (06/23/2024)  Financial Resource Strain: Low Risk (12/01/2022)  Physical Activity: Inactive (06/23/2024)  Social Connections: Socially Isolated (06/23/2024)  Stress: No Stress Concern Present (06/23/2024)  Tobacco Use: Medium Risk (06/23/2024)  Health Literacy: Adequate Health Literacy (06/23/2024)   See flowsheets for full screening details  Depression Screen PHQ 2 & 9 Depression Scale- Over the past 2 weeks, how often have you been bothered by any of the following  problems? Little interest or pleasure in doing things: 0 Feeling down, depressed, or hopeless (PHQ Adolescent also includes...irritable): 0 PHQ-2 Total Score: 0 Trouble falling or staying asleep, or sleeping too much: 0 Feeling tired or having little energy: 3 Poor appetite or overeating (PHQ Adolescent also includes...weight loss): 0 Feeling bad about yourself - or that you are a failure or have let yourself or your family down: 0 Trouble concentrating on things, such as reading the newspaper or watching television (PHQ Adolescent also includes...like school work): 0 Moving or speaking so slowly that other people could have noticed. Or the opposite - being so fidgety or restless that you have been moving around a lot more than usual: 0 Thoughts that you would be better off dead, or of hurting yourself in some way: 0 PHQ-9 Total Score: 3 If you checked off any problems, how difficult have these problems made it for you to do your work, take care of things at home, or get along with other people?: Not difficult at all  Depression Treatment Depression Interventions/Treatment : EYV7-0 Score <4 Follow-up Not Indicated     Goals Addressed  This Visit's Progress     Patient Stated (pt-stated)        To stay healthy/2026             Objective:    Today's Vitals   06/23/24 1456  BP: 92/70  Pulse: 93  SpO2: 98%  Weight: 170 lb (77.1 kg)  Height: 5' 9 (1.753 m)   Body mass index is 25.1 kg/m.  Hearing/Vision screen Hearing Screening - Comments::  Tinnitus of both ears   Vision Screening - Comments:: Denies vision issues./not UTD/need a provider  Immunizations and Health Maintenance Health Maintenance  Topic Date Due   DTaP/Tdap/Td (1 - Tdap) Never done   Zoster Vaccines- Shingrix  (1 of 2) Never done   Fecal DNA (Cologuard)  Never done   COVID-19 Vaccine (3 - Mixed Product risk series) 08/26/2019   Medicare Annual Wellness (AWV)  06/23/2025    Pneumococcal Vaccine: 50+ Years  Completed   Influenza Vaccine  Completed   Hepatitis C Screening  Completed   Meningococcal B Vaccine  Aged Out   Colonoscopy  Discontinued        Assessment/Plan:  This is a routine wellness examination for [place].  Patient Care Team: Joshua Debby CROME, MD as PCP - General (Internal Medicine) Alvan Ronal BRAVO, MD (Inactive) as PCP - Cardiology (Cardiology) Vertell Pont, RN as Oncology Nurse Navigator Carolee Sherwood JONETTA Criag, MD as Consulting Physician (Urology) Patrcia Cough, MD as Consulting Physician (Radiation Oncology) Crawford, Morna Pickle, NP as Nurse Practitioner (Hematology and Oncology) Starla Wendelyn JONETTA, RN as Oncology Nurse Navigator  I have personally reviewed and noted the following in the patients chart:   Medical and social history Use of alcohol, tobacco or illicit drugs  Current medications and supplements including opioid prescriptions. Functional ability and status Nutritional status Physical activity Advanced directives List of other physicians Hospitalizations, surgeries, and ER visits in previous 12 months Vitals Screenings to include cognitive, depression, and falls Referrals and appointments  Orders Placed This Encounter  Procedures   Flu vaccine HIGH DOSE PF(Fluzone Trivalent)   In addition, I have reviewed and discussed with patient certain preventive protocols, quality metrics, and best practice recommendations. A written personalized care plan for preventive services as well as general preventive health recommendations were provided to patient.   Nathan Hunt, CMA   06/23/2024   Return in 1 year (on 06/23/2025).  After Visit Summary: (In Person-Printed) AVS printed and given to the patient  Nurse Notes: Patient is due for a Shingrix  vaccine and a tdap.  Patient is also due for a Cologuard and would like to discuss during next office visit with provider. "

## 2024-06-23 NOTE — Patient Instructions (Addendum)
 [name],  Thank you for taking the time for your Medicare Wellness Visit. I appreciate your continued commitment to your health goals. Please review the care plan we discussed, and feel free to reach out if I can assist you further.  Please note that Annual Wellness Visits do not include a physical exam. Some assessments may be limited, especially if the visit was conducted virtually. If needed, we may recommend an in-person follow-up with your provider.  Ongoing Care Seeing your primary care provider every 3 to 6 months helps us  monitor your health and provide consistent, personalized care. Last office visit on 01/13/2024.  Please schedule an office visit soon.  You are due for a Shingles vaccine and a tetanus vaccine.  These can be given at your local pharmacy.   Medicaid Dental Providers in/near [place], [place]: [facility]): Known for general dentistry and prosthodontics.  [facility]): Highly rated for family dental care.  [facility]): Provides comprehensive family services.  [facility]): Specializes in family and children's dentistry.  [facility]): Offers services including cleanings, fillings, and dentures for patients 10 and older.  Dr. Luis Camacho, DDS [facility]): Located on [address]   There are several Eye Doctors in your area. Here are a few that usually accept all insurance types:  [facility] [address] [place], [place] [address] Phone: [phone]  [facility] [address] [place], [place] [address] Phone: [phone]  [facility] [address] [place], [place] [address] Phone: [phone]  [facility] [address] [facility] [employer] [place], [place] [address] Phone: [phone]  [facility] [address] [place], [place] [address] Phone: [phone]  [facility] [address] [place], [place] [address] [phone] Please let us  know if you require a referral for an eye exam appointment. Thank you!  [facility] Address: [address] [place], [place] [address] Phone: [phone]  Referrals If a referral was made during today's visit and you haven't received any updates within two weeks, please contact the referred provider directly to check on the status.  Recommended Screenings:  Health Maintenance  Topic Date Due   DTaP/Tdap/Td vaccine (1 - Tdap) Never done   Zoster (Shingles) Vaccine (1 of 2) Never done   Cologuard (Stool DNA test)  Never done   COVID-19 Vaccine (3 - Mixed Product risk series) 08/26/2019   Medicare Annual Wellness Visit  12/01/2023   Flu Shot  12/31/2023   Pneumococcal Vaccine for age over 73  Completed   Hepatitis C Screening  Completed   Meningitis B Vaccine  Aged Out   Colon Cancer Screening  Discontinued       08/25/2023    9:03 AM  Advanced Directives  Does Patient Have a Medical Advance Directive? No  Would patient like information on creating a medical advance directive? No - Patient declined    Vision: Annual vision screenings are recommended for early detection of glaucoma, cataracts, and diabetic retinopathy. These exams can also reveal signs of chronic conditions such as diabetes and high blood pressure.  Dental: Annual dental screenings help detect early signs of oral cancer, gum disease, and other conditions linked to overall health, including heart disease and diabetes.  Please see the attached documents for additional preventive care recommendations.

## 2024-06-27 ENCOUNTER — Ambulatory Visit: Admitting: Emergency Medicine

## 2024-06-27 NOTE — Progress Notes (Unsigned)
" °  Cardiology Office Note:    Date:  06/27/2024  ID:  Nathan Hunt, DOB [birth year], MRN [id number] PCP: Joshua Debby CROME, MD  [facility] Providers Cardiologist:  Alvan Ronal BRAVO, MD (Inactive) { Click to update primary MD,subspecialty MD or APP then REFRESH:1}    {Click to Open Review  :1}   Patient Profile:       Chief Complaint: *** History of Present Illness:  Nathan Hunt is a 74 y.o. male with visit-pertinent history of CKD, schizophrenia/bipolar, coronary artery disease  Patient established with cardiology service on 02/17/2023 with Dr. Alvan for coronary artery disease.  He had underwent CT cardiac scoring on 09/2022 with CAC of 656 (75th percentile) and aortic atherosclerosis.  During his initial visit he was started on aspirin  81 mg daily.  His EKG did not show ischemia.  He was to follow-up in 6 months.  Last seen in clinic by Dr. Alvan on 08/18/2023.  He was without anginal symptoms.  No changes were made.  He is follow-up in 6 months.  Discussed the use of AI scribe software for clinical note transcription with the patient, who gave verbal consent to proceed.  History of Present Illness     Review of systems:  Please see the history of present illness. All other systems are reviewed and otherwise negative. ***      Studies Reviewed:        ***  Risk Assessment/Calculations:   {Does this patient have ATRIAL FIBRILLATION?[phone]} No BP recorded.  {Refresh Note OR Click here to enter BP  :1}***        Physical Exam:   VS:  There were no vitals taken for this visit.   Wt Readings from Last 3 Encounters:  06/23/24 170 lb (77.1 kg)  01/13/24 161 lb 9.6 oz (73.3 kg)  12/21/23 163 lb (73.9 kg)    GEN: Well nourished, well developed in no acute distress NECK: No JVD; No carotid bruits CARDIAC: ***RRR, no murmurs, rubs, gallops RESPIRATORY:  Clear to auscultation without rales, wheezing or rhonchi  ABDOMEN: Soft, non-tender,  non-distended EXTREMITIES:  No edema; No acute deformity ***      Assessment and Plan:    Assessment and Plan Assessment & Plan      {Are you ordering a CV Procedure (e.g. stress test, cath, DCCV, TEE, etc)?   Press F2        :[id number]}  Dispo:  No follow-ups on file.  Signed, Lum CROME Louis, NP  "

## 2024-07-31 ENCOUNTER — Ambulatory Visit: Admitting: Internal Medicine
